# Patient Record
Sex: Male | Born: 1955 | Race: Black or African American | Hispanic: No | Marital: Single | Smoking: Former smoker
Health system: Southern US, Community
[De-identification: ages and names within clinical notes are randomized; demographics above are authoritative.]

## PROBLEM LIST (undated history)

## (undated) DIAGNOSIS — R52 Pain, unspecified: Secondary | ICD-10-CM

## (undated) DIAGNOSIS — R7303 Prediabetes: Secondary | ICD-10-CM

## (undated) DIAGNOSIS — I10 Essential (primary) hypertension: Secondary | ICD-10-CM

## (undated) DIAGNOSIS — K219 Gastro-esophageal reflux disease without esophagitis: Secondary | ICD-10-CM

## (undated) HISTORY — PX: ESOPHAGOGASTRODUODENOSCOPY: SHX1529

## (undated) HISTORY — PX: COLONOSCOPY: SHX174

## (undated) HISTORY — DX: Prediabetes: R73.03

---

## 2003-01-30 ENCOUNTER — Ambulatory Visit (HOSPITAL_COMMUNITY): Admission: RE | Admit: 2003-01-30 | Discharge: 2003-01-30 | Payer: Self-pay | Admitting: Gastroenterology

## 2004-05-30 ENCOUNTER — Encounter (INDEPENDENT_AMBULATORY_CARE_PROVIDER_SITE_OTHER): Payer: Self-pay | Admitting: *Deleted

## 2004-05-30 ENCOUNTER — Ambulatory Visit (HOSPITAL_COMMUNITY): Admission: RE | Admit: 2004-05-30 | Discharge: 2004-05-30 | Payer: Self-pay | Admitting: Gastroenterology

## 2005-06-29 ENCOUNTER — Emergency Department (HOSPITAL_COMMUNITY): Admission: EM | Admit: 2005-06-29 | Discharge: 2005-06-29 | Payer: Self-pay | Admitting: Emergency Medicine

## 2009-09-15 ENCOUNTER — Emergency Department (HOSPITAL_COMMUNITY): Admission: EM | Admit: 2009-09-15 | Discharge: 2009-09-15 | Payer: Self-pay | Admitting: Emergency Medicine

## 2010-05-10 LAB — CBC
HCT: 40.8 % (ref 39.0–52.0)
MCH: 29.6 pg (ref 26.0–34.0)
MCHC: 34.9 g/dL (ref 30.0–36.0)
MCV: 84.9 fL (ref 78.0–100.0)
Platelets: 257 10*3/uL (ref 150–400)
WBC: 8.9 10*3/uL (ref 4.0–10.5)

## 2010-05-10 LAB — DIFFERENTIAL
Basophils Relative: 1 % (ref 0–1)
Eosinophils Relative: 2 % (ref 0–5)
Lymphocytes Relative: 18 % (ref 12–46)
Lymphs Abs: 1.6 10*3/uL (ref 0.7–4.0)
Monocytes Absolute: 0.7 10*3/uL (ref 0.1–1.0)
Neutrophils Relative %: 71 % (ref 43–77)

## 2010-07-11 NOTE — Op Note (Signed)
NAME:  Maxwell Harris, Maxwell Harris             ACCOUNT NO.:  [id number]   MEDICAL RECORD NO.:  [id number]          PATIENT TYPE:  AMB   LOCATION:  ENDO                         FACILITY:  [facility]   PHYSICIAN:  Anselmo Rod, M.D.  DATE OF BIRTH:  1955-06-25   DATE OF PROCEDURE:  05/30/2004  DATE OF DISCHARGE:                                 OPERATIVE REPORT   PROCEDURE:  Colonoscopy with cold biopsies x 1.   ENDOSCOPIST:  Charna Elizabeth, M.D.   INSTRUMENT USED:  Olympus video colonoscope.   INDICATIONS FOR PROCEDURE:  55 year old African American male with a family  history of guaiac positive stool and change in bowel habits undergoing  screening colonoscopy to rule out colonic polyps, masses, etc.   PREPROCEDURE PREPARATION:  Informed consent was procured from the patient.  The patient was fasted for eight hours prior to the procedure and prepped  with a bottle of magnesium citrate and a gallon of GoLYTELY the night prior  to the procedure.  The risks and benefits of the procedure including a 10%  miss rate of cancer and polyp was discussed with the patient, as well.   PREPROCEDURE PHYSICAL:  Patient with stable vital signs.  Neck supple.  Chest clear to auscultation.  S1 and S2 regular.  Abdomen soft with normal  bowel sounds.   DESCRIPTION OF PROCEDURE:  The patient was placed in the left lateral  decubitus position, sedated with 100 mg of Demerol and 6 mg Versed in slow  incremental doses.  Once the patient was adequately sedated, maintained on  low flow oxygen and continuous cardiac monitoring, the Olympus video  colonoscope was advanced from the rectum to the cecum.  There was some  residual stool in the distal left colon, multiple washes were done.  The  appendiceal orifice and ileocecal valve were clearly visualized and  photographed.  A small erosion was biopsied x 1 from the terminal ileum.  The rest of the exam was unremarkable.  Prominent internal hemorrhoids were  seen on  retroflexion.  No masses, polyps, erosions, ulcerations, or  diverticula were seen in the colon.   IMPRESSION:  1.  Small erosion biopsied from terminal ileum.  2.  Prominent internal hemorrhoids.  3.  Otherwise, unrevealing colonoscopy.   RECOMMENDATIONS:  1.  Repeat guaiac testing has been recommended on an outpatient basis.  2.  Repeat colonoscopy will be performed in the next five years unless the      patient develops any abnormal symptoms in the interim.  3.  Outpatient follow up in the next two weeks or earlier if need be.      JNM/MEDQ  D:  05/30/2004  T:  05/30/2004  Job:  [id number]   cc:   Gabriel Earing, M.D.  [address]  [place]  [place] [address]  Fax: [phone]

## 2010-07-11 NOTE — Op Note (Signed)
NAME:  Maxwell Harris, Maxwell Harris                         ACCOUNT NO.:  [id number]   MEDICAL RECORD NO.:  [id number]                   PATIENT TYPE:  AMB   LOCATION:  ENDO                                 FACILITY:  [facility]   PHYSICIAN:  Anselmo Rod, M.D.               DATE OF BIRTH:  1955-10-17   DATE OF PROCEDURE:  01/30/2003  DATE OF DISCHARGE:                                 OPERATIVE REPORT   PROCEDURE PERFORMED:  Esophagogastroduodenoscopy.   ENDOSCOPIST:  Anselmo Rod, M.D.   INSTRUMENT USED:  Olympus video panendoscope.   INDICATIONS FOR PROCEDURE:  Epigastric pain in a 55 year old [place]-  American male.  Rule out peptic ulcer disease, esophagitis, gastritic, etc.   PREPROCEDURE PREPARATION:  Informed consent was secured from the patient.  The patient fasted for eight hours prior to the procedure.   PREPROCEDURE PHYSICAL:  VITAL SIGNS:  Stable.  NECK:  Supple.  CHEST:  Clear to auscultation.  HEART:  S1, S2 regular.  ABDOMEN:  Soft, with normal bowel sounds.   DESCRIPTION OF THE PROCEDURE:  The patient was placed in the left lateral  decubitus position and sedated with 80 mg of Demerol and 8 mg of Versed  intravenously.  Once the patient was adequately sedated and maintained on  low-flow oxygen and continuous cardiac monitor, the Olympus video  panendoscope was advanced through the mouthpiece, over the tongue, into the  esophagus under direct vision.  The entire esophagus appeared normal with no  evidence of ring, strictures, masses, esophagitis, or Barrett's mucosa.  The  scope was then advanced into the stomach.  The entire gastric mucosa and the  proximal small bowel appeared normal.  Retroflexion of the high cardia  revealed no abnormalities.   IMPRESSION:  Normal esophagogastroduodenoscopy.   RECOMMENDATIONS:  1. Continue PPIs.  2. Avoid nonsteroidals.  3. Outpatient follow-up in the next four weeks for further recommendations.  4. Colonoscopy after the age of  20.                                               Anselmo Rod, M.D.    JNM/MEDQ  D:  01/30/2003  T:  01/31/2003  Job:  [id number]   cc:   Gabriel Earing, M.D.  [address]  [place]  [place] [address]  Fax: [phone]

## 2011-07-24 ENCOUNTER — Encounter (HOSPITAL_COMMUNITY): Payer: Self-pay | Admitting: Emergency Medicine

## 2011-07-24 ENCOUNTER — Emergency Department (HOSPITAL_COMMUNITY)
Admission: EM | Admit: 2011-07-24 | Discharge: 2011-07-24 | Disposition: A | Discharge status: Home / Self Care | Payer: Medicare HMO | Attending: Emergency Medicine | Admitting: Emergency Medicine

## 2011-07-24 DIAGNOSIS — Z91013 Allergy to seafood: Secondary | ICD-10-CM | POA: Insufficient documentation

## 2011-07-24 DIAGNOSIS — R319 Hematuria, unspecified: Secondary | ICD-10-CM

## 2011-07-24 DIAGNOSIS — Z87891 Personal history of nicotine dependence: Secondary | ICD-10-CM | POA: Insufficient documentation

## 2011-07-24 LAB — URINALYSIS, ROUTINE W REFLEX MICROSCOPIC
Glucose, UA: NEGATIVE mg/dL
Ketones, ur: NEGATIVE mg/dL
Leukocytes, UA: NEGATIVE
Nitrite: NEGATIVE
Urobilinogen, UA: 0.2 mg/dL (ref 0.0–1.0)
pH: 5.5 (ref 5.0–8.0)

## 2011-07-24 LAB — URINE MICROSCOPIC-ADD ON

## 2011-07-24 NOTE — ED Notes (Signed)
Pt states he noticed his urine having a brown color yesterday. Pt states he woke up this morning at 3 am to use the bathroom and his urine was brown. Pt states he is having minor pain in his back/flank area but thinks it is because of his "bad hips." pt is in no apparent distress.

## 2011-07-24 NOTE — ED Notes (Signed)
Patient reports left sided flank pain and brownish color urine.

## 2011-07-24 NOTE — ED Provider Notes (Signed)
History     CSN: [id number]  Arrival date & time 07/24/11  0405   First MD Initiated Contact with Patient 07/24/11 [phone]      Chief Complaint  Patient presents with  . Flank Pain    left side  . Hematuria    (Consider location/radiation/quality/duration/timing/severity/associated sxs/prior treatment) The history is provided by the patient.   the patient reports several episodes of dark urine yesterday.  He reports his urine specimen given in the emergency department is much clearer than it was yesterday.  He denies anticoagulant use.  He denies flank pain.  He has no radiating abdominal pain.  He denies nausea vomiting.  He's had no fevers chills.  Denies dysuria or urinary frequency.  He wanted to be evaluated because of his dark urine.  He is otherwise without complaints  History reviewed. No pertinent past medical history.  No past surgical history on file.  History reviewed. No pertinent family history.  History  Substance Use Topics  . Smoking status: Former Smoker    Types: Cigarettes  . Smokeless tobacco: Not on file  . Alcohol Use: 0.6 oz/week    1 Cans of beer per week     social      Review of Systems  Genitourinary: Positive for hematuria and flank pain.  All other systems reviewed and are negative.    Allergies  Shellfish allergy  Home Medications   Current Outpatient Rx  Name Route Sig Dispense Refill  . ACETAMINOPHEN 500 MG PO TABS Oral Take 500 mg by mouth every 6 (six) hours as needed. joint    . VITAMIN D 1000 UNITS PO TABS Oral Take 1,000 Units by mouth daily.    Marland Kitchen HYDROCODONE-ACETAMINOPHEN 5-500 MG PO TABS Oral Take 1 tablet by mouth every 6 (six) hours as needed. Severe joint arthritis    . OMEPRAZOLE 20 MG PO CPDR Oral Take 20 mg by mouth daily.      BP 144/92  Pulse 73  Temp 98.3 F (36.8 C)  Resp 20  SpO2 99%  Physical Exam  Nursing note and vitals reviewed. Constitutional: He is oriented to person, place, and time. He appears  well-developed and well-nourished.  HENT:  Head: Normocephalic and atraumatic.  Eyes: EOM are normal.  Neck: Normal range of motion.  Cardiovascular: Normal rate, regular rhythm, normal heart sounds and intact distal pulses.   Pulmonary/Chest: Effort normal and breath sounds normal. No respiratory distress.  Abdominal: Soft. He exhibits no distension. There is no tenderness.  Musculoskeletal: Normal range of motion.  Neurological: He is alert and oriented to person, place, and time.  Skin: Skin is warm and dry.  Psychiatric: He has a normal mood and affect. Judgment normal.    ED Course  Procedures (including critical care time)  Labs Reviewed  URINALYSIS, ROUTINE W REFLEX MICROSCOPIC - Abnormal; Notable for the following:    Hgb urine dipstick LARGE (*)    Protein, ur 30 (*)    All other components within normal limits  URINE MICROSCOPIC-ADD ON - Abnormal; Notable for the following:    Bacteria, UA FEW (*)    All other components within normal limits   No results found.   1. Hematuria       MDM  Urology follow up.  The patient is without any symptoms.  He denies flank pain.  No anticoagulants.  Microscopic hematuria       Lyanne Co, MD 07/24/11 [phone]

## 2012-03-24 ENCOUNTER — Ambulatory Visit: Payer: Medicare HMO | Admitting: Physical Therapy

## 2012-03-29 ENCOUNTER — Ambulatory Visit: Payer: Non-veteran care | Attending: Orthopedic Surgery | Admitting: Physical Therapy

## 2012-03-29 DIAGNOSIS — R262 Difficulty in walking, not elsewhere classified: Secondary | ICD-10-CM | POA: Insufficient documentation

## 2012-03-29 DIAGNOSIS — IMO0001 Reserved for inherently not codable concepts without codable children: Secondary | ICD-10-CM | POA: Insufficient documentation

## 2012-03-29 DIAGNOSIS — M25569 Pain in unspecified knee: Secondary | ICD-10-CM | POA: Insufficient documentation

## 2012-04-01 ENCOUNTER — Ambulatory Visit: Payer: Non-veteran care | Admitting: Physical Therapy

## 2012-04-05 ENCOUNTER — Ambulatory Visit: Payer: Non-veteran care | Admitting: Physical Therapy

## 2012-04-07 ENCOUNTER — Ambulatory Visit: Payer: Non-veteran care | Admitting: Physical Therapy

## 2012-04-08 ENCOUNTER — Ambulatory Visit: Payer: Non-veteran care | Admitting: Physical Therapy

## 2012-04-12 ENCOUNTER — Ambulatory Visit: Payer: Non-veteran care | Admitting: Physical Therapy

## 2012-04-14 ENCOUNTER — Ambulatory Visit: Payer: Non-veteran care | Admitting: Physical Therapy

## 2012-04-19 ENCOUNTER — Ambulatory Visit: Payer: Non-veteran care | Admitting: Physical Therapy

## 2012-04-20 ENCOUNTER — Ambulatory Visit: Payer: Non-veteran care | Admitting: Physical Therapy

## 2012-04-21 ENCOUNTER — Ambulatory Visit: Payer: Non-veteran care | Admitting: Physical Therapy

## 2012-04-25 ENCOUNTER — Ambulatory Visit: Payer: Non-veteran care | Attending: Orthopedic Surgery | Admitting: Physical Therapy

## 2012-04-25 DIAGNOSIS — M25569 Pain in unspecified knee: Secondary | ICD-10-CM | POA: Insufficient documentation

## 2012-04-25 DIAGNOSIS — IMO0001 Reserved for inherently not codable concepts without codable children: Secondary | ICD-10-CM | POA: Insufficient documentation

## 2012-04-25 DIAGNOSIS — R262 Difficulty in walking, not elsewhere classified: Secondary | ICD-10-CM | POA: Insufficient documentation

## 2012-04-29 ENCOUNTER — Ambulatory Visit: Payer: Non-veteran care | Admitting: Physical Therapy

## 2012-05-02 ENCOUNTER — Ambulatory Visit: Payer: Non-veteran care | Admitting: Physical Therapy

## 2012-05-04 ENCOUNTER — Ambulatory Visit: Payer: Non-veteran care | Admitting: Physical Therapy

## 2012-05-05 ENCOUNTER — Ambulatory Visit: Payer: Non-veteran care | Admitting: Physical Therapy

## 2012-05-10 ENCOUNTER — Ambulatory Visit: Payer: Non-veteran care | Admitting: Physical Therapy

## 2012-05-12 ENCOUNTER — Ambulatory Visit: Payer: Non-veteran care | Admitting: Physical Therapy

## 2012-05-16 ENCOUNTER — Ambulatory Visit: Payer: Non-veteran care | Admitting: Physical Therapy

## 2014-08-23 ENCOUNTER — Other Ambulatory Visit: Payer: Self-pay | Admitting: Orthopedic Surgery

## 2014-08-23 DIAGNOSIS — M545 Low back pain: Secondary | ICD-10-CM

## 2014-09-02 ENCOUNTER — Ambulatory Visit
Admission: RE | Admit: 2014-09-02 | Discharge: 2014-09-02 | Disposition: A | Discharge status: Home / Self Care | Payer: Non-veteran care | Source: Ambulatory Visit | Attending: Orthopedic Surgery | Admitting: Orthopedic Surgery

## 2014-09-02 DIAGNOSIS — M545 Low back pain: Secondary | ICD-10-CM

## 2016-07-03 ENCOUNTER — Encounter (HOSPITAL_COMMUNITY): Payer: Self-pay | Admitting: *Deleted

## 2016-07-03 ENCOUNTER — Emergency Department (HOSPITAL_COMMUNITY)
Admission: EM | Admit: 2016-07-03 | Discharge: 2016-07-04 | Disposition: A | Discharge status: Home / Self Care | Payer: Non-veteran care | Attending: Emergency Medicine | Admitting: Emergency Medicine

## 2016-07-03 ENCOUNTER — Emergency Department (HOSPITAL_COMMUNITY): Payer: Non-veteran care

## 2016-07-03 DIAGNOSIS — Y929 Unspecified place or not applicable: Secondary | ICD-10-CM | POA: Diagnosis not present

## 2016-07-03 DIAGNOSIS — S01511A Laceration without foreign body of lip, initial encounter: Secondary | ICD-10-CM | POA: Diagnosis not present

## 2016-07-03 DIAGNOSIS — Y939 Activity, unspecified: Secondary | ICD-10-CM | POA: Insufficient documentation

## 2016-07-03 DIAGNOSIS — W1839XA Other fall on same level, initial encounter: Secondary | ICD-10-CM | POA: Insufficient documentation

## 2016-07-03 DIAGNOSIS — S0993XA Unspecified injury of face, initial encounter: Secondary | ICD-10-CM | POA: Diagnosis present

## 2016-07-03 DIAGNOSIS — R55 Syncope and collapse: Secondary | ICD-10-CM | POA: Diagnosis not present

## 2016-07-03 DIAGNOSIS — Z87891 Personal history of nicotine dependence: Secondary | ICD-10-CM | POA: Diagnosis not present

## 2016-07-03 DIAGNOSIS — S025XXA Fracture of tooth (traumatic), initial encounter for closed fracture: Secondary | ICD-10-CM | POA: Diagnosis not present

## 2016-07-03 DIAGNOSIS — S0181XA Laceration without foreign body of other part of head, initial encounter: Secondary | ICD-10-CM

## 2016-07-03 DIAGNOSIS — Y999 Unspecified external cause status: Secondary | ICD-10-CM | POA: Insufficient documentation

## 2016-07-03 DIAGNOSIS — S0182XA Laceration with foreign body of other part of head, initial encounter: Secondary | ICD-10-CM | POA: Diagnosis not present

## 2016-07-03 LAB — BASIC METABOLIC PANEL
ANION GAP: 11 (ref 5–15)
BUN: 12 mg/dL (ref 6–20)
CALCIUM: 9.3 mg/dL (ref 8.9–10.3)
CO2: 19 mmol/L — ABNORMAL LOW (ref 22–32)
Chloride: 107 mmol/L (ref 101–111)
Creatinine, Ser: 1.19 mg/dL (ref 0.61–1.24)
Glucose, Bld: 135 mg/dL — ABNORMAL HIGH (ref 65–99)
Potassium: 3.9 mmol/L (ref 3.5–5.1)
SODIUM: 137 mmol/L (ref 135–145)

## 2016-07-03 LAB — CBC
HCT: 37.5 % — ABNORMAL LOW (ref 39.0–52.0)
HEMOGLOBIN: 12.8 g/dL — AB (ref 13.0–17.0)
MCH: 29 pg (ref 26.0–34.0)
MCHC: 34.1 g/dL (ref 30.0–36.0)
MCV: 85 fL (ref 78.0–100.0)
PLATELETS: 245 10*3/uL (ref 150–400)
RBC: 4.41 MIL/uL (ref 4.22–5.81)
RDW: 13.5 % (ref 11.5–15.5)
WBC: 8.2 10*3/uL (ref 4.0–10.5)

## 2016-07-03 LAB — TROPONIN I

## 2016-07-03 MED ORDER — LIDOCAINE HCL 1 % IJ SOLN
30.0000 mL | Freq: Once | INTRAMUSCULAR | Status: AC
  Administered 2016-07-03: 30 mL
  Filled 2016-07-03: qty 40

## 2016-07-03 MED ORDER — MORPHINE SULFATE (PF) 4 MG/ML IV SOLN
4.0000 mg | Freq: Once | INTRAVENOUS | Status: AC
Start: 2016-07-03 — End: 2016-07-03
  Administered 2016-07-03: 4 mg via INTRAVENOUS
  Filled 2016-07-03: qty 1

## 2016-07-03 NOTE — ED Provider Notes (Signed)
[facility]ENCY [facility]PT Provider Note   CSN: [id number] Arrival date & time: 07/03/16  2210     History   Chief Complaint Chief Complaint  Patient presents with  . Loss of Consciousness  . Laceration    HPI Maxwell Harris is a 61 y.o. male.  Patient presents with facial injury after syncopal episode prior to arrival. He reports he became choked on water he was drinking and passed out, falling forward and hitting his face on wood floor. He reports LOC was brief. No nausea, vomiting, chest pain, SOB. He denies abdominal injury. No neck pain. He feels his upper teeth are loose. No headache, visual changes.   The history is provided by the patient. No language interpreter was used.    History reviewed. No pertinent past medical history.  There are no active problems to display for this patient.   History reviewed. No pertinent surgical history.     Home Medications    Prior to Admission medications   Medication Sig Start Date End Date Taking? Authorizing Provider  acetaminophen (TYLENOL) 500 MG tablet Take 500 mg by mouth every 6 (six) hours as needed. joint    [provider]  cholecalciferol (VITAMIN D) 1000 UNITS tablet Take 1,000 Units by mouth daily.    [provider]  HYDROcodone-acetaminophen (VICODIN) 5-500 MG per tablet Take 1 tablet by mouth every 6 (six) hours as needed. Severe joint arthritis    [provider]  omeprazole (PRILOSEC) 20 MG capsule Take 20 mg by mouth daily.    [provider]    Family History No family history on file.  Social History Social History  Substance Use Topics  . Smoking status: Former Smoker    Types: Cigarettes  . Smokeless tobacco: Never Used  . Alcohol use 0.6 oz/week    1 Cans of beer per week     Comment: social     Allergies   Shellfish allergy   Review of Systems Review of Systems  Constitutional: Negative for chills, diaphoresis and fever.  HENT: Positive for facial  swelling. Negative for trouble swallowing.   Eyes: Negative for visual disturbance.  Respiratory: Negative.  Negative for shortness of breath.   Cardiovascular: Negative.  Negative for chest pain.  Gastrointestinal: Negative.  Negative for nausea and vomiting.  Musculoskeletal: Negative.   Skin: Positive for color change and wound.  Neurological: Positive for syncope. Negative for weakness and headaches.     Physical Exam Updated Vital Signs BP 114/68   Resp (!) 22   Ht 5\' 11"  (1.803 m)   Wt 125.2 kg   BMI 38.49 kg/m   Physical Exam  Constitutional: He is oriented to person, place, and time. He appears well-developed and well-nourished.  HENT:  Head: Normocephalic.  Lower lip has a large flap wound to central lip involving full thickness of lip. 2 cm linear laceration upper lip extending to buccal surface. Upper central incisor laxity. No maxillary plate tenderness. No mandibular tenderness or deformity. No malocclusion.   Neck: Normal range of motion. Neck supple.  Cardiovascular: Normal rate.   Pulmonary/Chest: Effort normal. He exhibits no tenderness.  Abdominal: Soft. Bowel sounds are normal. There is no tenderness. There is no rebound and no guarding.  Musculoskeletal: Normal range of motion. He exhibits no deformity.  No midline cervical tenderness. Full, pain free range of motion of the neck.   Neurological: He is alert and oriented to person, place, and time. No sensory deficit. He exhibits normal muscle tone. Coordination  normal.  CN's 3-12 grossly intact. Speech is clear and focused. No facial asymmetry. No lateralizing weakness. No deficits of coordination. Ambulatory without imbalance.    Skin: Skin is warm and dry. No rash noted.  Superficial abrasion and laceration to glabellar area with mild swelling. Abrasion to parietal scalp without hematoma.  Psychiatric: He has a normal mood and affect.     ED Treatments / Results  Labs (all labs ordered are listed, but  only abnormal results are displayed) Labs Reviewed  BASIC METABOLIC PANEL - Abnormal; Notable for the following:       Result Value   CO2 19 (*)    Glucose, Bld 135 (*)    All other components within normal limits  CBC - Abnormal; Notable for the following:    Hemoglobin 12.8 (*)    HCT 37.5 (*)    All other components within normal limits  TROPONIN I  CBG MONITORING, ED    EKG  EKG Interpretation None       Radiology No results found.  Procedures Procedures (including critical care time) LACERATION REPAIR Performed by: Elpidio AnisUPSTILL, Darien Kading A Authorized by: Elpidio AnisUPSTILL, Tracker Mance A Consent: Verbal consent obtained. Risks and benefits: risks, benefits and alternatives were discussed Consent given by: patient Patient identity confirmed: provided demographic data Prepped and Draped in normal sterile fashion Wound explored  Laceration Location: lower lip  Laceration Length: 4 cm  No Foreign Bodies seen or palpated  Anesthesia: local infiltration  Local anesthetic: lidocaine 1% w/o epinephrine  Anesthetic total: 4 ml  Amount of cleaning: standard  Complicated closure due to full thickness open flap of lower lip, stellate portion requiring debridement.   Skin closure: 6-0 and 5-0 chromic gut  Number of sutures: 18  Technique: simple interrupted  Patient tolerance: Patient tolerated the procedure well with no immediate complications.   LACERATION REPAIR Performed by: Elpidio AnisUPSTILL, Didier Brandenburg A Authorized by: Elpidio AnisUPSTILL, Roberth Berling A Consent: Verbal consent obtained. Risks and benefits: risks, benefits and alternatives were discussed Consent given by: patient Patient identity confirmed: provided demographic data Prepped and Draped in normal sterile fashion Wound explored  Laceration Location: upper lip extending to buccal surface  Laceration Length: 2 cm  No Foreign Bodies seen or palpated  Anesthesia: local infiltration  Local anesthetic: lidocaine 1% w/o  epinephrine  Anesthetic total: 2 ml  Irrigation method: syringe Amount of cleaning: standard  Skin closure: 6-0 chromic gut  Number of sutures: 3  Technique: simple interrupted.  Patient tolerance: Patient tolerated the procedure well with no immediate complications.  Medications Ordered in ED Medications  morphine 4 MG/ML injection 4 mg (not administered)  lidocaine (PF) (XYLOCAINE) 1 % injection 30 mL (not administered)     Initial Impression / Assessment and Plan / ED Course  I have reviewed the triage vital signs and the nursing notes.  Pertinent labs & imaging results that were available during my care of the patient were reviewed by me and considered in my medical decision making (see chart for details).     Patient presents with facial injury after syncopal episode caused by choking. No other injury. No chest pain, nausea, abdominal pain.   Lacerations repaired as per above note. No change in neurologic status during ED encounter. CT maxillofacial is negative for bony injury. There is a root fracture of upper incisor.   Final Clinical Impressions(s) / ED Diagnoses   Final diagnoses:  None   1. Facial injury 2. Syncope 3. Lip laceration  New Prescriptions New Prescriptions  No medications on file     Danne Harbor 07/04/16 Alverda Skeans, MD 07/04/16 [phone]

## 2016-07-03 NOTE — ED Triage Notes (Signed)
The pt supper teeth went through his lower lip  The laceration goes through the vermilion border

## 2016-07-03 NOTE — ED Triage Notes (Signed)
The pt fell when he fainted earlier tonight and he struck his mouth on the floor  He has a laceration and he has loose loose upper teeth

## 2016-07-04 MED ORDER — HYDROCODONE-ACETAMINOPHEN 5-325 MG PO TABS: 1.0000 | ORAL_TABLET | ORAL | 0 refills | Status: DC | PRN

## 2016-07-04 MED ORDER — CEPHALEXIN 500 MG PO CAPS: 500.0000 mg | ORAL_CAPSULE | Freq: Four times a day (QID) | ORAL | 0 refills | Status: DC

## 2016-07-04 NOTE — ED Notes (Signed)
Pt verbalized understanding of d/c instructions and has no further questions. Pt stable and NAD. VSS.  

## 2016-07-04 NOTE — ED Notes (Addendum)
PA in room to suture up pt's lip.

## 2018-03-09 IMAGING — CT CT MAXILLOFACIAL W/O CM
3 series · 16 of 47 positions shown, 19 images · non-contrast
Comparison: None.

CLINICAL DATA: Status post fall with facial injury. Deep laceration
to bottom lip.

EXAM:
CT MAXILLOFACIAL WITHOUT CONTRAST
TECHNIQUE: Multidetector CT imaging of the maxillofacial structures was
performed. Multiplanar CT image reconstructions were also generated.
A small metallic BB was placed on the right temple in order to
reliably differentiate right from left.

[Series 3: facialbone 2.0 st · axial · 0.37mm/px · z∈[+334,+482]mm · 10 of 86 slices shown, 13 images]
[im 6/86  brain]
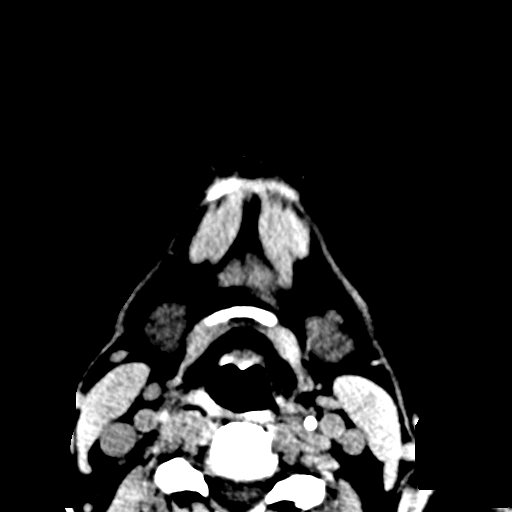
[im 6/86  bone]
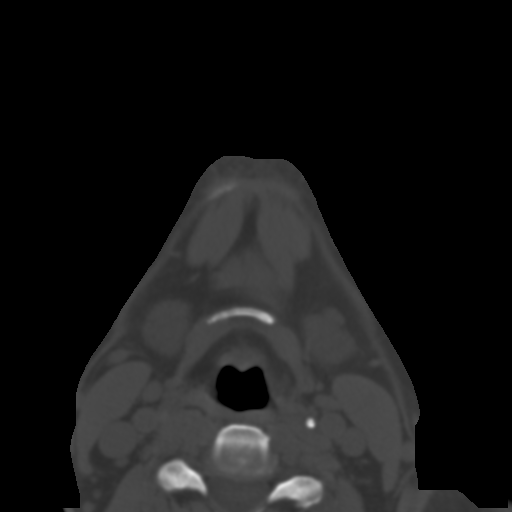
[im 15/86  bone]
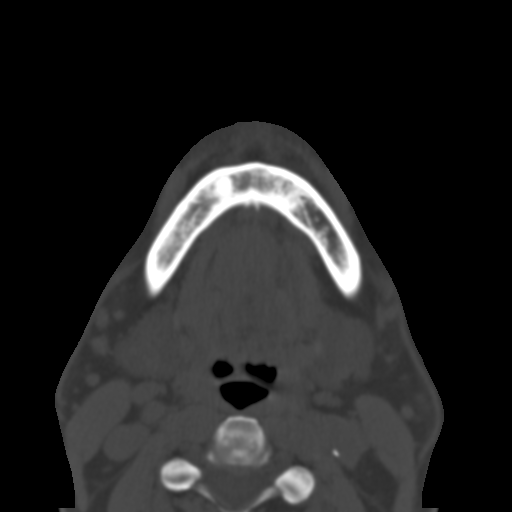
[im 24/86  bone]
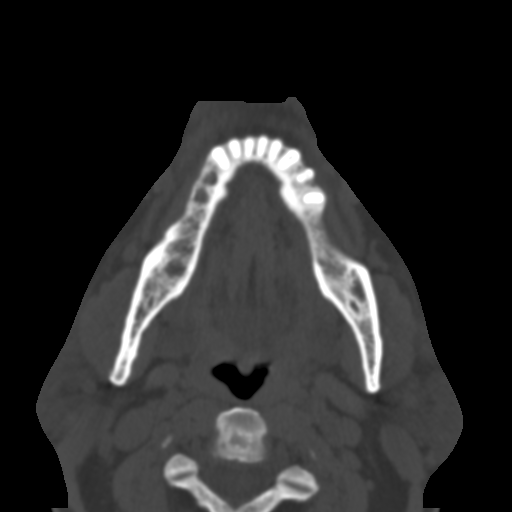
[im 30/86  bone]
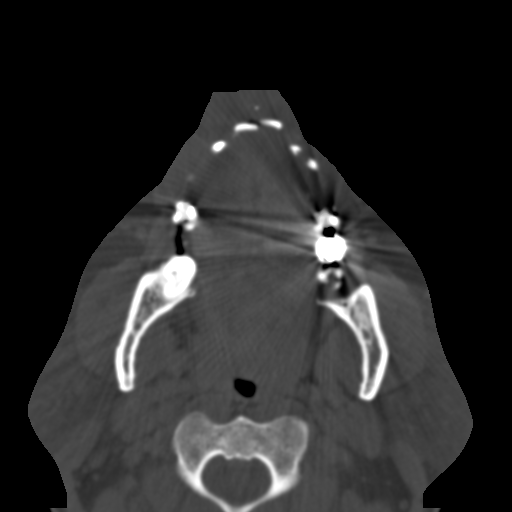
[im 39/86  brain]
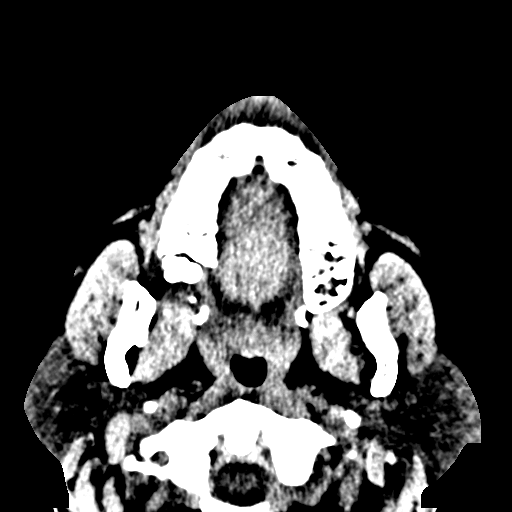
[im 39/86  bone]
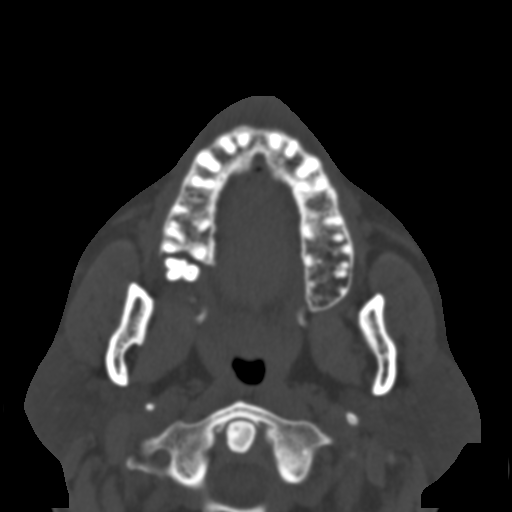
[im 47/86  bone]
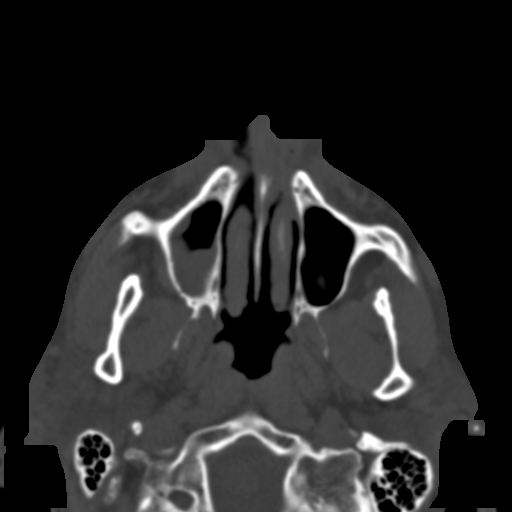
[im 56/86  bone]
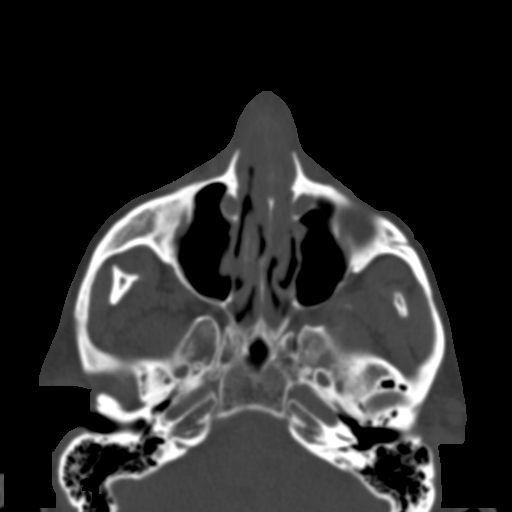
[im 65/86  bone]
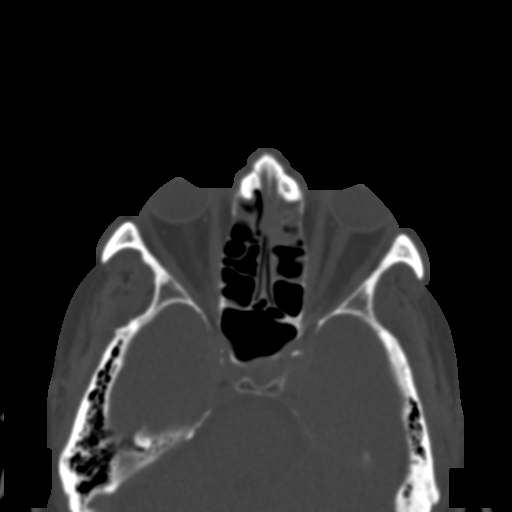
[im 71/86  brain]
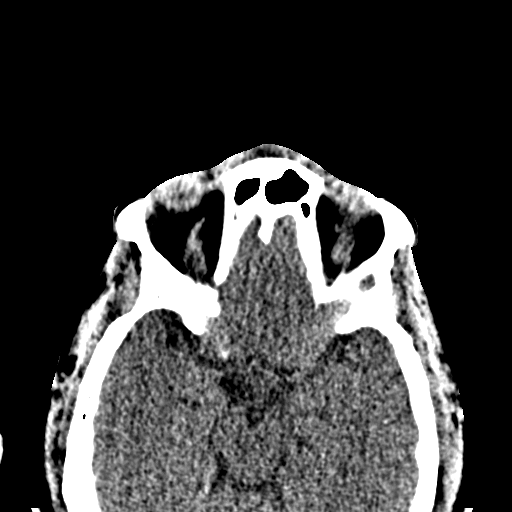
[im 71/86  bone]
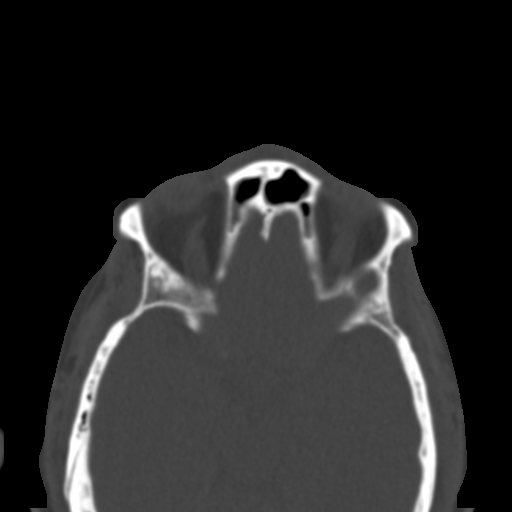
[im 80/86  bone]
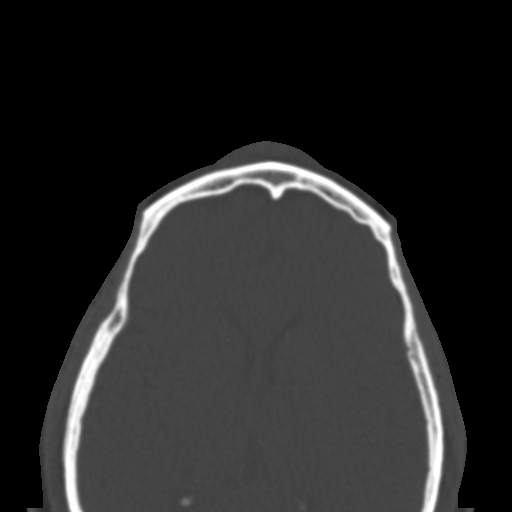

[Series 7: facialbone 2.0 cor st · coronal · 0.35mm/px · 3 of 76 slices shown]
[im 26/76  bone]
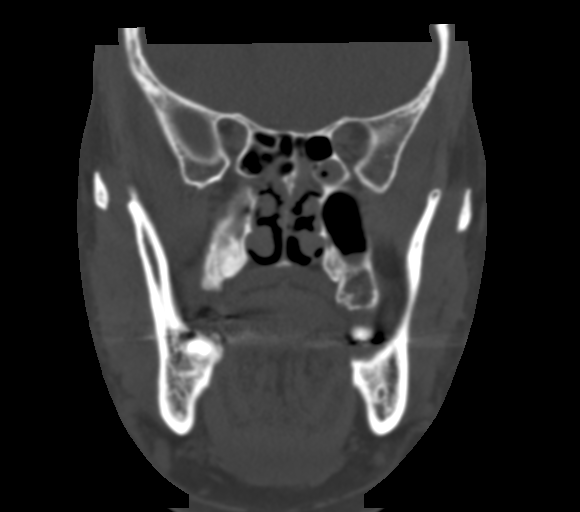
[im 34/76  bone]
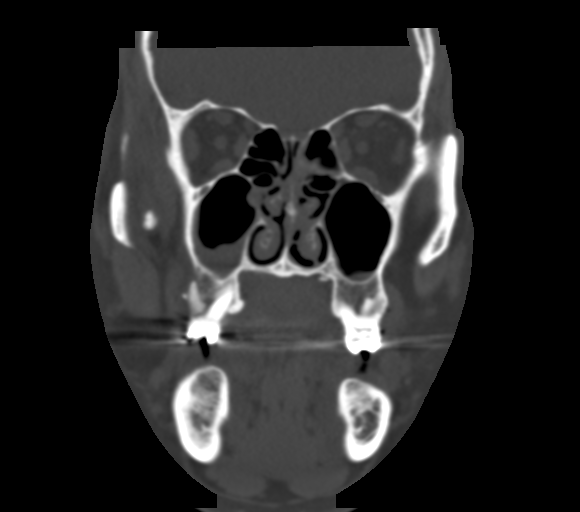
[im 42/76  bone]
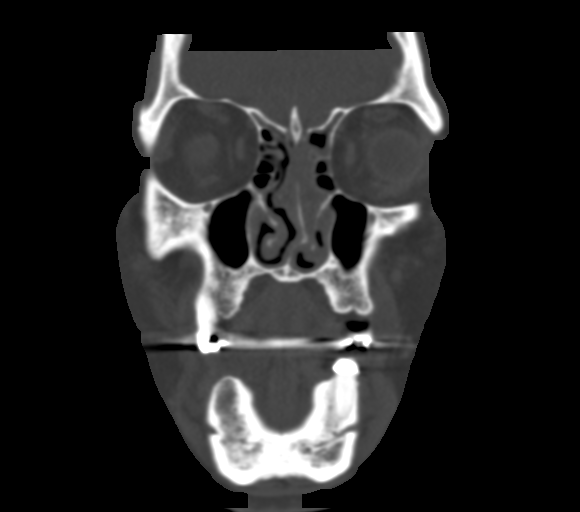

[Series 8: facialbone 2.0 sag st · sagittal · 0.35mm/px · 3 of 89 slices shown]
[im 30/89  bone]
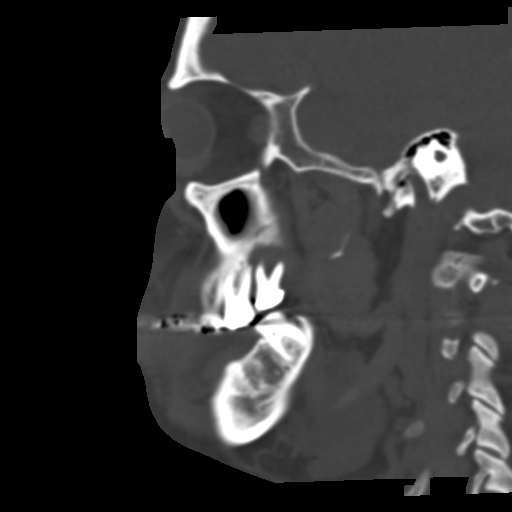
[im 45/89  bone]
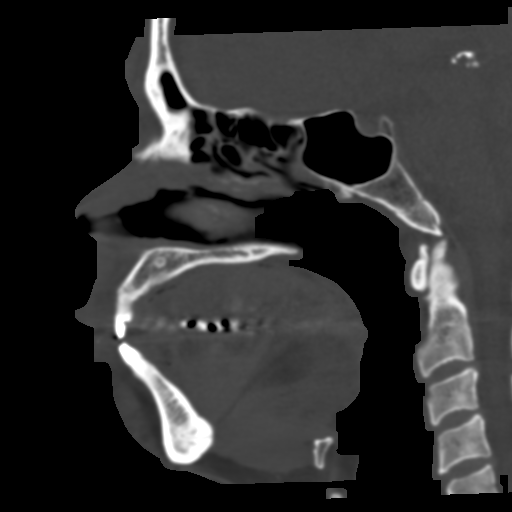
[im 59/89  bone]
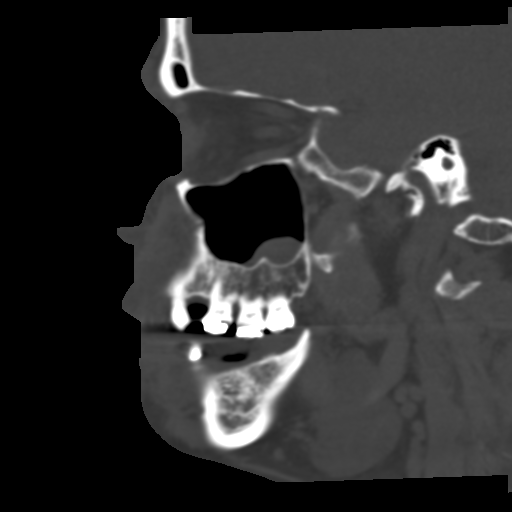

[16 of 47 positions shown; findings below may reference images not displayed]

FINDINGS: Osseous: Right first maxillary incisor root fracture (series 5,
image 22). Numerous dental caries and periapical cysts compatible
with odontogenic disease most severe involving the right maxillary
posterior most molar. Sclerotic focus within the dens compatible
with bone island.

Orbits: Negative. No traumatic or inflammatory finding.

Sinuses: Moderate right maxillary sinus mucosal thickening. Left
maxillary sinus mucous retention cyst, mild diffuse frontal and
ethmoid sinus mucosal thickening. Normally aerated mastoid air
cells.

Soft tissues: Frontal scalp soft tissue contusion with small foci of
air indicating laceration. Large laceration and soft tissue
contusion involving the lower lip.

Limited intracranial: No significant or unexpected finding.
IMPRESSION: 1. Frontal scalp soft tissue contusion.
2. Large laceration involving the lower lip.
3. Right first maxillary incisor root fracture.
4. No other acute fracture or dislocation of the face identified.
5. Extensive odontogenic disease greatest involving the right
maxillary posterior most molar.
6. Moderate diffuse paranasal sinus disease.

By: Dombi Bilics M.D.

## 2019-09-11 ENCOUNTER — Encounter (HOSPITAL_COMMUNITY): Payer: Self-pay | Admitting: Emergency Medicine

## 2019-09-11 ENCOUNTER — Other Ambulatory Visit: Payer: Self-pay

## 2019-09-11 ENCOUNTER — Emergency Department (HOSPITAL_COMMUNITY)
Admission: EM | Admit: 2019-09-11 | Discharge: 2019-09-11 | Disposition: A | Discharge status: Home / Self Care | Payer: Medicare PPO | Attending: Emergency Medicine | Admitting: Emergency Medicine

## 2019-09-11 DIAGNOSIS — R1010 Upper abdominal pain, unspecified: Secondary | ICD-10-CM | POA: Diagnosis present

## 2019-09-11 DIAGNOSIS — Z5321 Procedure and treatment not carried out due to patient leaving prior to being seen by health care provider: Secondary | ICD-10-CM | POA: Diagnosis not present

## 2019-09-11 DIAGNOSIS — R42 Dizziness and giddiness: Secondary | ICD-10-CM | POA: Diagnosis not present

## 2019-09-11 DIAGNOSIS — I10 Essential (primary) hypertension: Secondary | ICD-10-CM | POA: Diagnosis not present

## 2019-09-11 HISTORY — DX: Essential (primary) hypertension: I10

## 2019-09-11 NOTE — ED Triage Notes (Signed)
Pt reporting upper abdominal pain x1 month. Denies n/v. Patient also reporting dizziness and high blood pressure. States BP was 196/88.   Patient had colonoscopy on June 17th which was normal.

## 2019-09-11 NOTE — ED Notes (Signed)
Pt eloped from waiting area. Called 3X.  

## 2019-09-12 ENCOUNTER — Ambulatory Visit
Admission: EM | Admit: 2019-09-12 | Discharge: 2019-09-12 | Disposition: A | Discharge status: Home / Self Care | Payer: Non-veteran care

## 2019-09-12 DIAGNOSIS — R1013 Epigastric pain: Secondary | ICD-10-CM | POA: Diagnosis not present

## 2019-09-12 HISTORY — DX: Pain, unspecified: R52

## 2019-09-12 HISTORY — DX: Gastro-esophageal reflux disease without esophagitis: K21.9

## 2019-09-12 MED ORDER — ALUM & MAG HYDROXIDE-SIMETH 200-200-20 MG/5ML PO SUSP
30.0000 mL | Freq: Once | ORAL | Status: AC
  Administered 2019-09-12: 30 mL via ORAL

## 2019-09-12 MED ORDER — FAMOTIDINE 20 MG PO TABS
20.0000 mg | ORAL_TABLET | Freq: Every day | ORAL | 0 refills | Status: DC
Start: 2019-09-12 — End: 2022-11-02

## 2019-09-12 MED ORDER — ESOMEPRAZOLE MAGNESIUM 40 MG PO CPDR: 40.0000 mg | DELAYED_RELEASE_CAPSULE | Freq: Every day | ORAL | 0 refills | Status: DC

## 2019-09-12 MED ORDER — LIDOCAINE VISCOUS HCL 2 % MT SOLN
15.0000 mL | Freq: Once | OROMUCOSAL | Status: AC
  Administered 2019-09-12: 15 mL via ORAL

## 2019-09-12 NOTE — Discharge Instructions (Signed)
Lidocaine and Maalox given in office today.  Start Nexium and Pepcid as directed.  Decrease fatty foods, spicy food, caffeine, alcohol, smoking for now.  Follow-up with GI for further evaluation and management needed.  If sudden worsening of symptoms, nausea/vomiting, blood in the vomit, black tarry stools, go to the emergency department for further evaluation.

## 2019-09-12 NOTE — ED Provider Notes (Signed)
[facility]    CSN: [id number] Arrival date & time: 09/12/19  1535      History   Chief Complaint Chief Complaint  Patient presents with   Abdominal Pain    HPI Maxwell Harris is a 64 y.o. male.   63 year old male comes in for 1 month history of epigastric pain.  This started after having colonoscopy/endoscopy 08/13/2019.  States this was done due to anemia and worries for GI bleed.  Unsure of specific results, but states no alarming findings.  Since then, has had intermittent epigastric pain, improves with eating, worse after a few hours of eating.  Denies nausea, vomiting, diarrhea.  Has noticed increased burping, passing gas.  Denies constipation, diarrhea.  Denies significant changes in bowel movements.  Denies burning sensation, globus sensation.  Denies fever.  Denies melena, hematochezia.  Does have a history of GERD, usually takes omeprazole, ran out a week ago.  He denies chronic NSAID use.  Has frequent alcohol use, caffeine use, THC use.     Past Medical History:  Diagnosis Date   Chronic GERD    Hypertension    Pain     There are no problems to display for this patient.   History reviewed. No pertinent surgical history.     Home Medications    Prior to Admission medications   Medication Sig Start Date End Date Taking? Authorizing Provider  acetaminophen (TYLENOL) 500 MG tablet Take 500 mg by mouth every 6 (six) hours as needed. joint   Yes [provider]  aspirin 81 MG chewable tablet Chew by mouth daily.   Yes [provider]  cholecalciferol (VITAMIN D) 1000 UNITS tablet Take 1,000 Units by mouth daily.   Yes [provider]  DULoxetine (CYMBALTA) 20 MG capsule Take 20 mg by mouth daily.   Yes [provider]  gabapentin (NEURONTIN) 300 MG capsule Take 300 mg by mouth 3 (three) times daily.   Yes [provider]  lisinopril (ZESTRIL) 10 MG tablet Take 10 mg by mouth daily.   Yes [provider]  omeprazole (PRILOSEC) 20 MG capsule Take 20 mg by mouth daily.  09/12/19 Yes [provider]  esomeprazole (NEXIUM) 40 MG capsule Take 1 capsule (40 mg total) by mouth daily. 09/12/19   Cathie Hoops, Eriberto Felch V, PA-C  famotidine (PEPCID) 20 MG tablet Take 1 tablet (20 mg total) by mouth daily. 09/12/19   Belinda Fisher, PA-C    Family History Family History  Problem Relation Age of Onset   Stroke Mother     Social History Social History   Tobacco Use   Smoking status: Former Smoker    Types: Cigarettes   Smokeless tobacco: Never Used  Building services engineer Use: Never used  Substance Use Topics   Alcohol use: Yes    Alcohol/week: 1.0 standard drink    Types: 1 Cans of beer per week    Comment: social   Drug use: Yes    Frequency: 1.0 times per week    Types: Marijuana     Allergies   Shellfish allergy   Review of Systems Review of Systems  Reason unable to perform ROS: See HPI as above.     Physical Exam Triage Vital Signs ED Triage Vitals  Enc Vitals Group     BP 09/12/19 1648 (!) 152/87     Pulse Rate 09/12/19 1648 74     Resp 09/12/19 1648 18     Temp 09/12/19 1648 98.8  F (37.1 C)     Temp Source 09/12/19 1648 Oral     SpO2 09/12/19 1648 95 %     Weight --      Height --      Head Circumference --      Peak Flow --      Pain Score 09/12/19 1640 7     Pain Loc --      Pain Edu? --      Excl. in GC? --    No data found.  Updated Vital Signs BP (!) 152/87 (BP Location: Left Arm)    Pulse 74    Temp 98.8 F (37.1 C) (Oral)    Resp 18    SpO2 95%    Physical Exam Constitutional:      General: He is not in acute distress.    Appearance: Normal appearance. He is well-developed. He is not toxic-appearing or diaphoretic.  HENT:     Head: Normocephalic and atraumatic.  Eyes:     Conjunctiva/sclera: Conjunctivae normal.     Pupils: Pupils are equal, round, and reactive to light.  Cardiovascular:     Rate and Rhythm: Normal rate and regular  rhythm.  Pulmonary:     Effort: Pulmonary effort is normal. No respiratory distress.     Comments: LCTAB Abdominal:     General: Bowel sounds are normal.     Palpations: Abdomen is soft.     Tenderness: There is abdominal tenderness in the epigastric area. There is no guarding or rebound. Negative signs include Murphy's sign.  Musculoskeletal:     Cervical back: Normal range of motion and neck supple.  Skin:    General: Skin is warm and dry.  Neurological:     Mental Status: He is alert and oriented to person, place, and time.      UC Treatments / Results  Labs (all labs ordered are listed, but only abnormal results are displayed) Labs Reviewed - No data to display  EKG   Radiology No results found.  Procedures Procedures (including critical care time)  Medications Ordered in UC Medications  alum & mag hydroxide-simeth (MAALOX/MYLANTA) 200-200-20 MG/5ML suspension 30 mL (30 mLs Oral Given 09/12/19 1730)    And  lidocaine (XYLOCAINE) 2 % viscous mouth solution 15 mL (15 mLs Oral Given 09/12/19 1731)    Initial Impression / Assessment and Plan / UC Course  I have reviewed the triage vital signs and the nursing notes.  Pertinent labs & imaging results that were available during my care of the patient were reviewed by me and considered in my medical decision making (see chart for details).    Chronic intermittent epigastric pain. ?  GERD causing symptoms.  Will provide GI cocktail in office today.  PPI + H2 blocker trial. Discussed decreasing EtOH, caffeine use for now. To follow up with GI if symptoms not improving. Return precautions given.  Final Clinical Impressions(s) / UC Diagnoses   Final diagnoses:  Abdominal pain, epigastric    ED Prescriptions    Medication Sig Dispense Auth. Provider   esomeprazole (NEXIUM) 40 MG capsule Take 1 capsule (40 mg total) by mouth daily. 14 capsule Hector Taft V, PA-C   famotidine (PEPCID) 20 MG tablet Take 1 tablet (20 mg total) by  mouth daily. 14 tablet Belinda Fisher, PA-C     PDMP not reviewed this encounter.   Belinda Fisher, PA-C 09/12/19 1733

## 2019-09-12 NOTE — ED Triage Notes (Signed)
Pt reports midepigastric/abdominal pain since he had a colonoscopy on June 20th.  Reports feeling of emptiness.  Denies sharp pains or tenderness.  States he has also noticed bad breath since procedure.  Denies n/v/d.  Denies fever.  Reports increased flatulence.  Pt states he does have reflux.

## 2019-12-18 ENCOUNTER — Ambulatory Visit
Admission: EM | Admit: 2019-12-18 | Discharge: 2019-12-18 | Disposition: A | Discharge status: Home / Self Care | Payer: Non-veteran care | Attending: Physician Assistant | Admitting: Physician Assistant

## 2019-12-18 DIAGNOSIS — R590 Localized enlarged lymph nodes: Secondary | ICD-10-CM | POA: Diagnosis not present

## 2019-12-18 MED ORDER — IBUPROFEN 800 MG PO TABS
800.0000 mg | ORAL_TABLET | Freq: Three times a day (TID) | ORAL | 0 refills | Status: DC
Start: 2019-12-18 — End: 2022-05-22

## 2019-12-18 NOTE — ED Provider Notes (Signed)
[facility]    CSN: [id number] Arrival date & time: 12/18/19  0998      History   Chief Complaint Chief Complaint  Patient presents with   Otalgia    HPI Maxwell Harris is a 64 y.o. male.   64 year old male comes in for 3 day history of right ear pain. Denies muffled hearing. States feels like there is drainage/water in the ear. Denies URI symptoms. Denies chest pain, shob, fever. Has had some dental work done recently. Denies oral swelling. States has some gum irritation from new dentures.      Past Medical History:  Diagnosis Date   Chronic GERD    Hypertension    Pain     There are no problems to display for this patient.   History reviewed. No pertinent surgical history.     Home Medications    Prior to Admission medications   Medication Sig Start Date End Date Taking? Authorizing Provider  acetaminophen (TYLENOL) 500 MG tablet Take 500 mg by mouth every 6 (six) hours as needed. joint    [provider]  aspirin 81 MG chewable tablet Chew by mouth daily.    [provider]  cholecalciferol (VITAMIN D) 1000 UNITS tablet Take 1,000 Units by mouth daily.    [provider]  DULoxetine (CYMBALTA) 20 MG capsule Take 20 mg by mouth daily.    [provider]  famotidine (PEPCID) 20 MG tablet Take 1 tablet (20 mg total) by mouth daily. 09/12/19   Cathie Hoops, Khylah Kendra V, PA-C  gabapentin (NEURONTIN) 300 MG capsule Take 300 mg by mouth 3 (three) times daily.    [provider]  ibuprofen (ADVIL) 800 MG tablet Take 1 tablet (800 mg total) by mouth 3 (three) times daily. 12/18/19   Cathie Hoops, Ayaan Shutes V, PA-C  lisinopril (ZESTRIL) 10 MG tablet Take 10 mg by mouth daily.    [provider]  omeprazole (PRILOSEC) 20 MG capsule Take 20 mg by mouth daily.  09/12/19  [provider]    Family History Family History  Problem Relation Age of Onset   Stroke Mother     Social History Social History   Tobacco Use    Smoking status: Former Smoker    Types: Cigarettes   Smokeless tobacco: Never Used  Building services engineer Use: Never used  Substance Use Topics   Alcohol use: Yes    Alcohol/week: 1.0 standard drink    Types: 1 Cans of beer per week    Comment: social   Drug use: Yes    Frequency: 1.0 times per week    Types: Marijuana     Allergies   Shellfish allergy   Review of Systems Review of Systems  Reason unable to perform ROS: See HPI as above.     Physical Exam Triage Vital Signs ED Triage Vitals  Enc Vitals Group     BP 12/18/19 0951 139/90     Pulse Rate 12/18/19 0951 72     Resp 12/18/19 0951 18     Temp 12/18/19 0951 97.9 F (36.6 C)     Temp Source 12/18/19 0951 Oral     SpO2 12/18/19 0951 95 %     Weight --      Height --      Head Circumference --      Peak Flow --      Pain Score 12/18/19 1001 8     Pain Loc --  Pain Edu? --      Excl. in GC? --    No data found.  Updated Vital Signs BP 139/90 (BP Location: Right Arm)    Pulse 72    Temp 97.9 F (36.6 C) (Oral)    Resp 18    SpO2 95%   Physical Exam Constitutional:      General: He is not in acute distress.    Appearance: Normal appearance. He is well-developed. He is not toxic-appearing or diaphoretic.  HENT:     Head: Normocephalic and atraumatic.     Right Ear: Tympanic membrane, ear canal and external ear normal.     Left Ear: Tympanic membrane, ear canal and external ear normal.     Ears:     Comments: No tragal tenderness to palpation bilaterally.     Mouth/Throat:     Mouth: Mucous membranes are moist.     Pharynx: Oropharynx is clear. Uvula midline.     Comments: No tenderness to the gum. No signs of dental abscess.  Eyes:     Conjunctiva/sclera: Conjunctivae normal.     Pupils: Pupils are equal, round, and reactive to light.  Neck:     Comments: Right lymphadenopathy.  Pulmonary:     Effort: Pulmonary effort is normal. No respiratory distress.  Musculoskeletal:     Cervical  back: Normal range of motion and neck supple.  Skin:    General: Skin is warm and dry.  Neurological:     Mental Status: He is alert and oriented to person, place, and time.      UC Treatments / Results  Labs (all labs ordered are listed, but only abnormal results are displayed) Labs Reviewed - No data to display  EKG   Radiology No results found.  Procedures Procedures (including critical care time)  Medications Ordered in UC Medications - No data to display  Initial Impression / Assessment and Plan / UC Course  I have reviewed the triage vital signs and the nursing notes.  Pertinent labs & imaging results that were available during my care of the patient were reviewed by me and considered in my medical decision making (see chart for details).    Patient complains of ear pain, but points to right neck for pain. Exam with lymphadenopathy of the area. Patient currently without obvious signs of infection. Denies URI symptoms, fever. Had recent dental work done without obvious signs of dental abscess/pain. Will trial NSAIDs and monitoring. Return precautions given. Otherwise close follow up with PCP. Patient expresses understanding and agrees to plan.  Final Clinical Impressions(s) / UC Diagnoses   Final diagnoses:  Lymphadenopathy of right cervical region   ED Prescriptions    Medication Sig Dispense Auth. Provider   ibuprofen (ADVIL) 800 MG tablet Take 1 tablet (800 mg total) by mouth 3 (three) times daily. 21 tablet Belinda Fisher, PA-C     PDMP not reviewed this encounter.   Belinda Fisher, PA-C 12/18/19 1029

## 2019-12-18 NOTE — Discharge Instructions (Signed)
Start ibuprofen as directed. You can take pepcid or other acid reflux medicine to prevent stomach upset. Follow up with dentist for adjustment of your dentures that may be causing some gum discomfort. Monitor for any signs of infection including redness, swelling, drainage.

## 2019-12-18 NOTE — ED Triage Notes (Signed)
Pt c/o rt ear ache with small drainage x3 days.

## 2020-06-12 ENCOUNTER — Ambulatory Visit
Admission: EM | Admit: 2020-06-12 | Discharge: 2020-06-12 | Disposition: A | Discharge status: Home / Self Care | Payer: Non-veteran care | Attending: Emergency Medicine | Admitting: Emergency Medicine

## 2020-06-12 ENCOUNTER — Telehealth: Payer: Self-pay | Admitting: Emergency Medicine

## 2020-06-12 ENCOUNTER — Other Ambulatory Visit: Payer: Self-pay

## 2020-06-12 DIAGNOSIS — T783XXA Angioneurotic edema, initial encounter: Secondary | ICD-10-CM

## 2020-06-12 MED ORDER — PREDNISONE 50 MG PO TABS
50.0000 mg | ORAL_TABLET | Freq: Every day | ORAL | 0 refills | Status: DC
Start: 2020-06-12 — End: 2022-05-22

## 2020-06-12 MED ORDER — AMLODIPINE BESYLATE 5 MG PO TABS
5.0000 mg | ORAL_TABLET | Freq: Every day | ORAL | 0 refills | Status: DC
Start: 2020-06-12 — End: 2022-10-23

## 2020-06-12 NOTE — Telephone Encounter (Signed)
Reports lip swelling spreading to lower lip, discussed half-life of lisinopril, provided prednisone course to trial, declined swelling spreading to tongue, throat or having any difficulty breathing.

## 2020-06-12 NOTE — ED Triage Notes (Signed)
Pt presents with top lip swelling after having an allergic reaction to an unknown source yesterday; pt states he has some tingling, slight pain and tightness bto top lip.

## 2020-06-12 NOTE — Discharge Instructions (Signed)
I suspect this lip swelling is from your lisinopril, stop taking Take amlodipine daily and follow-up with primary care Please ensure lip swelling gradually resolving with time, if developing any worsening swelling, difficulty breathing shortness of breath please return immediately

## 2020-06-12 NOTE — ED Provider Notes (Signed)
[facility]    CSN: [id number] Arrival date & time: 06/12/20  1207      History   Chief Complaint Chief Complaint  Patient presents with  . Allergic Reaction  . Oral Swelling    HPI Maxwell Harris is a 65 y.o. male history of hypertension presenting today for evaluation of allergic reaction.  Reports swelling to lip beginning yesterday.  Has some tingling and slight discomfort in the to his top lip.  Denies any other oral swelling of tongue swelling, throat swelling or any difficulty breathing, shortness of breath.  Denies history of similar allergic reactions.  Denies any new foods, medicines, lip products.  Patient does take lisinopril, last took this morning.  HPI  Past Medical History:  Diagnosis Date  . Chronic GERD   . Hypertension   . Pain     There are no problems to display for this patient.   History reviewed. No pertinent surgical history.     Home Medications    Prior to Admission medications   Medication Sig Start Date End Date Taking? Authorizing Provider  amLODipine (NORVASC) 5 MG tablet Take 1 tablet (5 mg total) by mouth daily. 06/12/20  Yes Olivene Cookston C, PA-C  acetaminophen (TYLENOL) 500 MG tablet Take 500 mg by mouth every 6 (six) hours as needed. joint    [provider]  aspirin 81 MG chewable tablet Chew by mouth daily.    [provider]  cholecalciferol (VITAMIN D) 1000 UNITS tablet Take 1,000 Units by mouth daily.    [provider]  DULoxetine (CYMBALTA) 20 MG capsule Take 20 mg by mouth daily.    [provider]  famotidine (PEPCID) 20 MG tablet Take 1 tablet (20 mg total) by mouth daily. 09/12/19   Cathie Hoops, Amy V, PA-C  gabapentin (NEURONTIN) 300 MG capsule Take 300 mg by mouth 3 (three) times daily.    [provider]  ibuprofen (ADVIL) 800 MG tablet Take 1 tablet (800 mg total) by mouth 3 (three) times daily. 12/18/19   Cathie Hoops, Amy V, PA-C  omeprazole (PRILOSEC) 20 MG capsule Take 20 mg  by mouth daily.  09/12/19  [provider]    Family History Family History  Problem Relation Age of Onset  . Stroke Mother     Social History Social History   Tobacco Use  . Smoking status: Former Smoker    Types: Cigarettes  . Smokeless tobacco: Never Used  Vaping Use  . Vaping Use: Never used  Substance Use Topics  . Alcohol use: Yes    Alcohol/week: 1.0 standard drink    Types: 1 Cans of beer per week    Comment: social  . Drug use: Yes    Frequency: 1.0 times per week    Types: Marijuana     Allergies   Lisinopril and Shellfish allergy   Review of Systems Review of Systems  Constitutional: Negative for fatigue and fever.  HENT: Positive for facial swelling. Negative for trouble swallowing.   Eyes: Negative for redness, itching and visual disturbance.  Respiratory: Negative for shortness of breath.   Cardiovascular: Negative for chest pain and leg swelling.  Gastrointestinal: Negative for nausea and vomiting.  Musculoskeletal: Negative for arthralgias and myalgias.  Skin: Negative for color change, rash and wound.  Neurological: Negative for dizziness, syncope, weakness, light-headedness and headaches.     Physical Exam Triage Vital Signs ED Triage Vitals [06/12/20 1245]  Enc Vitals Group     BP  Pulse      Resp      Temp      Temp src      SpO2      Weight      Height      Head Circumference      Peak Flow      Pain Score 3     Pain Loc      Pain Edu?      Excl. in GC?    No data found.  Updated Vital Signs BP 130/86 (BP Location: Left Arm)   Pulse 80   Temp 98.1 F (36.7 C) (Oral)   Resp 17   SpO2 96%   Visual Acuity Right Eye Distance:   Left Eye Distance:   Bilateral Distance:    Right Eye Near:   Left Eye Near:    Bilateral Near:     Physical Exam Vitals and nursing note reviewed.  Constitutional:      Appearance: He is well-developed.     Comments: No acute distress  HENT:     Head: Normocephalic and  atraumatic.     Nose: Nose normal.     Mouth/Throat:     Comments: Upper lip swelling noted, no other oral swelling, posterior pharynx patent, uvula midline, no soft palate swelling Eyes:     Conjunctiva/sclera: Conjunctivae normal.  Cardiovascular:     Rate and Rhythm: Normal rate and regular rhythm.  Pulmonary:     Effort: Pulmonary effort is normal. No respiratory distress.     Comments: Speaking in full sentences, breathing comfortably at rest  Breathing comfortably at rest, CTABL, no wheezing, rales or other adventitious sounds auscultated Abdominal:     General: There is no distension.  Musculoskeletal:        General: Normal range of motion.     Cervical back: Neck supple.  Skin:    General: Skin is warm and dry.     Comments: No rash noted  Neurological:     Mental Status: He is alert and oriented to person, place, and time.      UC Treatments / Results  Labs (all labs ordered are listed, but only abnormal results are displayed) Labs Reviewed - No data to display  EKG   Radiology No results found.  Procedures Procedures (including critical care time)  Medications Ordered in UC Medications - No data to display  Initial Impression / Assessment and Plan / UC Course  I have reviewed the triage vital signs and the nursing notes.  Pertinent labs & imaging results that were available during my care of the patient were reviewed by me and considered in my medical decision making (see chart for details).     Suspect ACE inhibitor induced angioedema- deferring steroids as I suspect this likely is from lisinopril.  Stopping lisinopril and will switch to amlodipine as alternative antihypertensive.  Monitor for gradual resolution of lip swelling over the next 24 to 48 hours, follow-up if any symptoms progressing or worsening.  Discussed strict return precautions. Patient verbalized understanding and is agreeable with plan.  Final Clinical Impressions(s) / UC Diagnoses    Final diagnoses:  Angioedema, initial encounter     Discharge Instructions     I suspect this lip swelling is from your lisinopril, stop taking Take amlodipine daily and follow-up with primary care Please ensure lip swelling gradually resolving with time, if developing any worsening swelling, difficulty breathing shortness of breath please return immediately    ED Prescriptions  Medication Sig Dispense Auth. Provider   amLODipine (NORVASC) 5 MG tablet Take 1 tablet (5 mg total) by mouth daily. 30 tablet Loyed Wilmes, [place] C, PA-C     PDMP not reviewed this encounter.   Lew Dawes, PA-C 06/12/20 1331

## 2022-03-26 NOTE — Progress Notes (Unsigned)
[place] [phone]   [phone] NOTE  Patient Care Team: Patient, No Pcp Per as PCP - General (General Practice)  Hematological/Oncological History 03/03/2022: Labs from [place]: WBC 6.41, Hgb 7.9 (L), MCV 63.8 (L), Plt 349, Iron 17 (L), TIBC 591 (H), Ferritin 9.9 (L), Saturation 2.9% (L). 03/27/2022: Establish care with [facility] Hematology  CHIEF COMPLAINTS/PURPOSE OF CONSULTATION:  Iron deficiency anemia.  HISTORY OF PRESENTING ILLNESS:  Maxwell Harris 67 y.o. male with medical history significant for hypertension hematology clinic for patient for iron deficiency anemia.  He is unaccompanied for this visit.  On exam today, Maxwell Harris reports that he has been on p.o. iron for over a year.  He does have constipation as a side effect with hard stools.  He is not taking any stool softeners at this time.  Patient does report fatigue which has improved with iron pills.  He is able to complete all his daily activities on his own.  He has a good appetite and denies any dietary restrictions.  He denies nausea, vomiting or abdominal pain.  He reports having shortness of breath maybe mainly with exertion such as going up the stairs.  He has occasional episodes of dizziness without any syncopal episodes.  He does crave ice.  Patient denies fevers, chills or sweats, chest pain or cough.  He has no other complaints.  Rest of the 10 point ROS is below.   MEDICAL HISTORY:  Past Medical History:  Diagnosis Date   Chronic GERD    Hypertension    Pain    Pre-diabetes     SURGICAL HISTORY: History reviewed. No pertinent surgical history.  SOCIAL HISTORY: Social History   Socioeconomic History   Marital status: Single    Spouse name: Not on file   Number of children: Not on file   Years of education: Not on file   Highest education level: Not on file  Occupational History   Not on file  Tobacco Use   Smoking status: Former    Years: 5.00     Types: Cigarettes   Smokeless tobacco: Never   Tobacco comments:    Not able to remember when he quit  Vaping Use   Vaping Use: Never used  Substance and Sexual Activity   Alcohol use: Yes    Alcohol/week: 1.0 standard drink of alcohol    Types: 1 Cans of beer per week    Comment: social   Drug use: Yes    Frequency: 1.0 times per week    Types: Marijuana   Sexual activity: Not on file  Other Topics Concern   Not on file  Social History Narrative   Not on file   Social Determinants of Health   Financial Resource Strain: Not on file  Food Insecurity: Not on file  Transportation Needs: Not on file  Physical Activity: Not on file  Stress: Not on file  Social Connections: Not on file  Intimate Partner Violence: Not on file    FAMILY HISTORY: Family History  Problem Relation Age of Onset   Stroke Mother     ALLERGIES:  is allergic to lisinopril and shellfish allergy.  MEDICATIONS:  Current Outpatient Medications  Medication Sig Dispense Refill   acetaminophen (TYLENOL) 500 MG tablet Take 500 mg by mouth every 6 (six) hours as needed. joint     amLODipine (NORVASC) 5 MG tablet Take 1 tablet (5 mg total) by mouth daily. 30 tablet 0   aspirin 81 MG  chewable tablet Chew by mouth daily.     cholecalciferol (VITAMIN D) 1000 UNITS tablet Take 1,000 Units by mouth daily.     DULoxetine (CYMBALTA) 20 MG capsule Take 20 mg by mouth daily.     famotidine (PEPCID) 20 MG tablet Take 1 tablet (20 mg total) by mouth daily. 14 tablet 0   gabapentin (NEURONTIN) 300 MG capsule Take 300 mg by mouth 3 (three) times daily.     ibuprofen (ADVIL) 800 MG tablet Take 1 tablet (800 mg total) by mouth 3 (three) times daily. 21 tablet 0   predniSONE (DELTASONE) 50 MG tablet Take 1 tablet (50 mg total) by mouth daily with breakfast. (Patient not taking: Reported on 03/27/2022) 5 tablet 0   No current facility-administered medications for this visit.    REVIEW OF SYSTEMS:   Constitutional: ( - )  fevers, ( - )  chills , ( - ) night sweats Eyes: ( - ) blurriness of vision, ( - ) double vision, ( - ) watery eyes Ears, nose, mouth, throat, and face: ( - ) mucositis, ( - ) sore throat Respiratory: ( - ) cough, (+) dyspnea, ( - ) wheezes Cardiovascular: ( - ) palpitation, ( - ) chest discomfort, ( - ) lower extremity swelling Gastrointestinal:  ( - ) nausea, ( - ) heartburn, ( - ) change in bowel habits Skin: ( - ) abnormal skin rashes Lymphatics: ( - ) new lymphadenopathy, ( - ) easy bruising Neurological: ( - ) numbness, ( - ) tingling, ( - ) new weaknesses Behavioral/Psych: ( - ) mood change, ( - ) new changes  All other systems were reviewed with the patient and are negative.  PHYSICAL EXAMINATION: ECOG PERFORMANCE STATUS: 1 - Symptomatic but completely ambulatory  Vitals:   03/27/22 0858  BP: (!) 144/82  Pulse: 86  Resp: 16  Temp: 97.6 F (36.4 C)  SpO2: 98%   Filed Weights   03/27/22 0858  Weight: 233 lb 9.6 oz (106 kg)    GENERAL: well appearing male in NAD  SKIN: skin color, texture, turgor are normal, no rashes or significant lesions EYES: conjunctiva are pink and non-injected, sclera clear OROPHARYNX: no exudate, no erythema; lips, buccal mucosa, and tongue normal  NECK: supple, non-tender LYMPH:  no palpable lymphadenopathy in the cervical or supraclavicular lymph nodes.  LUNGS: clear to auscultation and percussion with normal breathing effort HEART: regular rate & rhythm and no murmurs and no lower extremity edema Musculoskeletal: no cyanosis of digits and no clubbing  PSYCH: alert & oriented x 3, fluent speech NEURO: no focal motor/sensory deficits  LABORATORY DATA:  I have reviewed the data as listed    Latest Ref Rng & Units 07/03/2016   10:24 PM 09/15/2009   10:30 AM  CBC  WBC 4.0 - 10.5 K/uL 8.2  8.9   Hemoglobin 13.0 - 17.0 g/dL 23.5  57.3   Hematocrit 39.0 - 52.0 % 37.5  40.8   Platelets 150 - 400 K/uL 245  257        Latest Ref Rng & Units  07/03/2016   10:24 PM  CMP  Glucose 65 - 99 mg/dL 220   BUN 6 - 20 mg/dL 12   Creatinine 2.54 - 1.24 mg/dL 2.70   Sodium 623 - 762 mmol/L 137   Potassium 3.5 - 5.1 mmol/L 3.9   Chloride 101 - 111 mmol/L 107   CO2 22 - 32 mmol/L 19   Calcium 8.9 - 10.3 mg/dL 9.3  ASSESSMENT & PLAN Maxwell Harris is a 67 y.o. male who presents to the hematology clinic for evaluation for iron deficiency anemia.  We discussed possible etiologies for iron deficiency including malabsorption versus GI blood loss.  Patient underwent endoscopic evaluation through the [place] in June 2021.  Colonoscopy was normal but EGD did show reactive gastropathy.  Patient has been compliant with taking p.o. iron for over a year.  Patient will proceed with laboratory evaluation today to determine if patient is a candidate for IV iron infusions.  We will follow-up with the patient by phone to discuss the results and our recommendations.  # Iron deficiency anemia: --Last colonoscopy in June 2021 that was normal. EGD showed reactive gastropathy.  We recommended follow-up with gastroenterology.  Discussed following up with [facility] gastroenterology versus new referral.  Patient is interested in referrals to going to [facility] gastro[facility]ology. -- Labs today to check CBC, CMP, reticulocyte panel, ferritin, iron and TIBC.  We will send a sample to blood bank in case patient needs blood transfusion. --Based on today's blood work we will determine if patient is a candidate for IV iron. --Okay to discontinue p.o. iron supplementation due to constipation. --Gave list of iron rich foods to incorporate into diet. --Tentative plan to return to clinic in 8 weeks with labs and follow-up visit.   Orders Placed This Encounter  Procedures   CBC with Differential ([place] Only)    Standing Status:   Future    Standing Expiration Date:   03/27/2023   CMP ([place] only)    Standing Status:   Future    Standing Expiration Date:   03/27/2023    Ferritin    Standing Status:   Future    Standing Expiration Date:   03/27/2023   Iron and Iron Binding Capacity ([facility] only)    Standing Status:   Future    Standing Expiration Date:   03/27/2023   Retic Panel    Standing Status:   Future    Standing Expiration Date:   03/27/2023   Ambulatory referral to Gastroenterology    Referral Priority:   Urgent    Referral Type:   Consultation    Referral Reason:   Specialty Services Required    Number of Visits Requested:   1   Sample to Blood Bank    Standing Status:   Future    Standing Expiration Date:   03/28/2023    All questions were answered. The patient knows to call the clinic with any problems, questions or concerns.  I have spent a total of 60 minutes minutes of face-to-face and non-face-to-face time, preparing to see the patient, obtaining and/or reviewing separately obtained history, performing a medically appropriate examination, counseling and educating the patient, ordering medications/tests/procedures, referring and communicating with other health care professionals, documenting clinical information in the electronic health record,  and care coordination.   Dede Query, PA-C Department of Hematology/Oncology [place] at [facility] Phone: [phone]  Patient was seen with Dr. Lorenso Courier  I have read the above note and personally examined the patient. I agree with the assessment and plan as noted above.  Briefly Maxwell Harris is a 67 year old male who presents for evaluation of iron deficiency anemia of unclear etiology.  At this time strongly suspect GI etiology.  Patient is undergone prior EGD and colonoscopy, with the latter being in 2021.  endoscopy is yet been performed.  Will make referral to [facility] for further evaluation.  Patient  has been struggling with constipation due to his p.o. iron therapy and therefore would recommend pursuing IV iron treatment.  Today we will order full iron  studies to include iron panel, ferritin, and repeat CBC.  The patient voiced understanding of our findings and the plan moving forward.   Ledell Peoples, MD Department of Hematology/Oncology [place] at [facility] Phone: [phone] Pager: [phone] Email: [email or web address]

## 2022-03-27 ENCOUNTER — Telehealth: Payer: Self-pay | Admitting: Pharmacy Technician

## 2022-03-27 ENCOUNTER — Inpatient Hospital Stay: Payer: Medicare Other

## 2022-03-27 ENCOUNTER — Encounter: Payer: Self-pay | Admitting: Physician Assistant

## 2022-03-27 ENCOUNTER — Telehealth: Payer: Self-pay

## 2022-03-27 ENCOUNTER — Inpatient Hospital Stay: Payer: Medicare Other | Attending: Physician Assistant | Admitting: Physician Assistant

## 2022-03-27 ENCOUNTER — Other Ambulatory Visit: Payer: Self-pay

## 2022-03-27 VITALS — BP 144/82 | HR 86 | Temp 97.6°F | Resp 16 | Wt 233.6 lb

## 2022-03-27 DIAGNOSIS — I10 Essential (primary) hypertension: Secondary | ICD-10-CM | POA: Diagnosis not present

## 2022-03-27 DIAGNOSIS — K5900 Constipation, unspecified: Secondary | ICD-10-CM | POA: Diagnosis not present

## 2022-03-27 DIAGNOSIS — Z87891 Personal history of nicotine dependence: Secondary | ICD-10-CM | POA: Insufficient documentation

## 2022-03-27 DIAGNOSIS — D509 Iron deficiency anemia, unspecified: Secondary | ICD-10-CM | POA: Insufficient documentation

## 2022-03-27 LAB — CMP (CANCER CENTER ONLY)
ALT: 37 U/L (ref 0–44)
AST: 25 U/L (ref 15–41)
Albumin: 4.1 g/dL (ref 3.5–5.0)
Alkaline Phosphatase: 82 U/L (ref 38–126)
Anion gap: 9 (ref 5–15)
BUN: 15 mg/dL (ref 8–23)
CO2: 27 mmol/L (ref 22–32)
Calcium: 9.7 mg/dL (ref 8.9–10.3)
Chloride: 102 mmol/L (ref 98–111)
Creatinine: 1.03 mg/dL (ref 0.61–1.24)
GFR, Estimated: >60 mL/min (ref >60)
Glucose, Bld: 118 mg/dL — ABNORMAL HIGH (ref 70–99)
Potassium: 3.7 mmol/L (ref 3.5–5.1)
Sodium: 138 mmol/L (ref 135–145)
Total Bilirubin: 0.3 mg/dL (ref 0.3–1.2)
Total Protein: 7.5 g/dL (ref 6.5–8.1)

## 2022-03-27 LAB — IRON AND IRON BINDING CAPACITY (CC-WL,HP ONLY)
Iron: 166 ug/dL (ref 45–182)
Saturation Ratios: 27 % (ref 17.9–39.5)
TIBC: 617 ug/dL — ABNORMAL HIGH (ref 250–450)
UIBC: 451 ug/dL — ABNORMAL HIGH (ref 117–376)

## 2022-03-27 LAB — CBC WITH DIFFERENTIAL (CANCER CENTER ONLY)
Abs Immature Granulocytes: 0.02 10*3/uL (ref 0.00–0.07)
Basophils Absolute: 0.1 10*3/uL (ref 0.0–0.1)
Basophils Relative: 1 %
Eosinophils Absolute: 0.3 10*3/uL (ref 0.0–0.5)
Eosinophils Relative: 4 %
HCT: 29.5 % — ABNORMAL LOW (ref 39.0–52.0)
Hemoglobin: 8.9 g/dL — ABNORMAL LOW (ref 13.0–17.0)
Immature Granulocytes: 0 %
Lymphocytes Relative: 19 %
Lymphs Abs: 1.2 10*3/uL (ref 0.7–4.0)
MCH: 19.6 pg — ABNORMAL LOW (ref 26.0–34.0)
MCHC: 30.2 g/dL (ref 30.0–36.0)
MCV: 65 fL — ABNORMAL LOW (ref 80.0–100.0)
Monocytes Absolute: 0.9 10*3/uL (ref 0.1–1.0)
Monocytes Relative: 14 %
Neutro Abs: 4 10*3/uL (ref 1.7–7.7)
Neutrophils Relative %: 62 %
Platelet Count: 439 10*3/uL — ABNORMAL HIGH (ref 150–400)
RBC: 4.54 MIL/uL (ref 4.22–5.81)
RDW: 21.2 % — ABNORMAL HIGH (ref 11.5–15.5)
WBC Count: 6.4 10*3/uL (ref 4.0–10.5)
nRBC: 0 % (ref 0.0–0.2)

## 2022-03-27 LAB — RETIC PANEL
Immature Retic Fract: 40.7 % — ABNORMAL HIGH (ref 2.3–15.9)
RBC.: 4.49 MIL/uL (ref 4.22–5.81)
Retic Count, Absolute: 92.5 10*3/uL (ref 19.0–186.0)
Retic Ct Pct: 2.1 % (ref 0.4–3.1)
Reticulocyte Hemoglobin: 22.6 pg — ABNORMAL LOW (ref >27.9)

## 2022-03-27 LAB — FERRITIN: Ferritin: 12 ng/mL — ABNORMAL LOW (ref 24–336)

## 2022-03-27 LAB — SAMPLE TO BLOOD BANK

## 2022-03-27 NOTE — Telephone Encounter (Addendum)
Called patient and relayed the message below. Patient had a full understanding      ----- Message from Lincoln Brigham, PA-C sent at 03/27/2022 11:26 AM EST ----- Please notify patient that labs confirm iron deficiency anemia. We will arrange for IV iron infusion at [facility]. Their schedulers will contact him for IV iron once they have insurance authorization. I will see him back in 8 weeks for l abs and follow up.

## 2022-03-27 NOTE — Telephone Encounter (Addendum)
community of care approval. Phone: [phone] Haywood Lasso)  Auth Submission: APPROVED Payer: community of care approval - [place] Medication & CPT/J Code(s) submitted:  MONOFERRIC Route of submission (phone, fax, portal):  Phone # [phone] Fax # Auth type: Buy/Bill Units/visits requested: X1 Reference number:  Approval from:03/27/22   to  04/25/22

## 2022-03-30 ENCOUNTER — Telehealth: Payer: Self-pay

## 2022-03-30 NOTE — Telephone Encounter (Signed)
[facility] will call him to schedule IV iron. I will see patient him in 8 weeks for labs and follow up afterwards. [facility] left a voicemail to schedule a consultation with him.

## 2022-03-30 NOTE — Telephone Encounter (Signed)
Patient called to follow-up on visit from 03/27/22. Patient is asking what the plan will be moving forward.  Routed to [place] T. PA

## 2022-03-30 NOTE — Telephone Encounter (Signed)
Called patient to advise of message below. Provided patient with phone number to [facility] so that he can return their phone call to schedule initial consult.

## 2022-03-31 ENCOUNTER — Telehealth: Payer: Self-pay | Admitting: Physician Assistant

## 2022-03-31 ENCOUNTER — Telehealth: Payer: Self-pay

## 2022-03-31 ENCOUNTER — Encounter: Payer: Self-pay | Admitting: Physician Assistant

## 2022-03-31 NOTE — Telephone Encounter (Signed)
T/C from pt requesting his records from his 03/27/22 consultation be faxed to his PCP at the [place].  PCP Dr. Herold Harms added and records faxed.  Confirmation received

## 2022-03-31 NOTE — Telephone Encounter (Signed)
Called patient to confirm new appointments. Voicemail not set up. Mailing patient reminders.

## 2022-04-07 ENCOUNTER — Ambulatory Visit (INDEPENDENT_AMBULATORY_CARE_PROVIDER_SITE_OTHER): Payer: No Typology Code available for payment source

## 2022-04-07 VITALS — BP 124/69 | HR 81 | Temp 98.5°F | Resp 16 | Ht 73.0 in | Wt 233.6 lb

## 2022-04-07 DIAGNOSIS — D509 Iron deficiency anemia, unspecified: Secondary | ICD-10-CM

## 2022-04-07 DIAGNOSIS — D508 Other iron deficiency anemias: Secondary | ICD-10-CM

## 2022-04-07 MED ORDER — SODIUM CHLORIDE 0.9 % IV SOLN
1000.0000 mg | Freq: Once | INTRAVENOUS | Status: AC
  Administered 2022-04-07: 1000 mg via INTRAVENOUS
  Filled 2022-04-07: qty 10

## 2022-04-07 MED ORDER — ACETAMINOPHEN 325 MG PO TABS
650.0000 mg | ORAL_TABLET | Freq: Once | ORAL | Status: AC
  Administered 2022-04-07: 650 mg via ORAL
  Filled 2022-04-07: qty 2

## 2022-04-07 NOTE — Patient Instructions (Signed)

## 2022-04-07 NOTE — Progress Notes (Signed)
Diagnosis: Iron Deficiency Anemia  Provider:  Marshell Garfinkel MD  Procedure: Infusion  IV Type: Peripheral, IV Location: L Antecubital  Monoferric (Ferric Derisomaltose), Dose: 1000 mg  Infusion Start Time: [id number]  Infusion Stop Time: 1016  Post Infusion IV Care: Peripheral IV Discontinued  Discharge: Condition: Good, Destination: Home . AVS Provided  Performed by:  Cleophus Molt, RN

## 2022-05-21 ENCOUNTER — Other Ambulatory Visit: Payer: Self-pay | Admitting: Physician Assistant

## 2022-05-21 DIAGNOSIS — D509 Iron deficiency anemia, unspecified: Secondary | ICD-10-CM

## 2022-05-22 ENCOUNTER — Inpatient Hospital Stay (HOSPITAL_BASED_OUTPATIENT_CLINIC_OR_DEPARTMENT_OTHER): Payer: Medicare Other | Admitting: Physician Assistant

## 2022-05-22 ENCOUNTER — Inpatient Hospital Stay: Payer: Medicare Other | Attending: Physician Assistant

## 2022-05-22 ENCOUNTER — Other Ambulatory Visit: Payer: Self-pay

## 2022-05-22 VITALS — BP 140/85 | HR 83 | Temp 97.3°F | Resp 20 | Wt 230.7 lb

## 2022-05-22 DIAGNOSIS — I10 Essential (primary) hypertension: Secondary | ICD-10-CM | POA: Insufficient documentation

## 2022-05-22 DIAGNOSIS — D509 Iron deficiency anemia, unspecified: Secondary | ICD-10-CM | POA: Diagnosis present

## 2022-05-22 DIAGNOSIS — Z87891 Personal history of nicotine dependence: Secondary | ICD-10-CM | POA: Insufficient documentation

## 2022-05-22 LAB — CMP (CANCER CENTER ONLY)
ALT: 28 U/L (ref 0–44)
AST: 24 U/L (ref 15–41)
Albumin: 4.6 g/dL (ref 3.5–5.0)
Alkaline Phosphatase: 69 U/L (ref 38–126)
Anion gap: 7 (ref 5–15)
BUN: 14 mg/dL (ref 8–23)
CO2: 29 mmol/L (ref 22–32)
Calcium: 9.6 mg/dL (ref 8.9–10.3)
Chloride: 102 mmol/L (ref 98–111)
Creatinine: 1.03 mg/dL (ref 0.61–1.24)
GFR, Estimated: >60 mL/min (ref >60)
Glucose, Bld: 113 mg/dL — ABNORMAL HIGH (ref 70–99)
Potassium: 4 mmol/L (ref 3.5–5.1)
Sodium: 138 mmol/L (ref 135–145)
Total Bilirubin: 0.3 mg/dL (ref 0.3–1.2)
Total Protein: 8.1 g/dL (ref 6.5–8.1)

## 2022-05-22 LAB — CBC WITH DIFFERENTIAL (CANCER CENTER ONLY)
Abs Immature Granulocytes: 0.02 10*3/uL (ref 0.00–0.07)
Basophils Absolute: 0 10*3/uL (ref 0.0–0.1)
Basophils Relative: 1 %
Eosinophils Absolute: 0.1 10*3/uL (ref 0.0–0.5)
Eosinophils Relative: 3 %
HCT: 38.4 % — ABNORMAL LOW (ref 39.0–52.0)
Hemoglobin: 12.6 g/dL — ABNORMAL LOW (ref 13.0–17.0)
Immature Granulocytes: 0 %
Lymphocytes Relative: 19 %
Lymphs Abs: 0.9 10*3/uL (ref 0.7–4.0)
MCH: 24.7 pg — ABNORMAL LOW (ref 26.0–34.0)
MCHC: 32.8 g/dL (ref 30.0–36.0)
MCV: 75.3 fL — ABNORMAL LOW (ref 80.0–100.0)
Monocytes Absolute: 0.7 10*3/uL (ref 0.1–1.0)
Monocytes Relative: 15 %
Neutro Abs: 3 10*3/uL (ref 1.7–7.7)
Neutrophils Relative %: 62 %
Platelet Count: 277 10*3/uL (ref 150–400)
RBC: 5.1 MIL/uL (ref 4.22–5.81)
WBC Count: 4.8 10*3/uL (ref 4.0–10.5)
nRBC: 0 % (ref 0.0–0.2)

## 2022-05-22 LAB — FERRITIN: Ferritin: 36 ng/mL (ref 24–336)

## 2022-05-22 LAB — IRON AND IRON BINDING CAPACITY (CC-WL,HP ONLY)
Iron: 66 ug/dL (ref 45–182)
Saturation Ratios: 13 % — ABNORMAL LOW (ref 17.9–39.5)
TIBC: 512 ug/dL — ABNORMAL HIGH (ref 250–450)
UIBC: 446 ug/dL — ABNORMAL HIGH (ref 117–376)

## 2022-05-22 NOTE — Progress Notes (Signed)
[place] [phone]   [phone]) [phone]  PROGRESS NOTE  Patient Care Team: Charlsie Merles, MD as PCP - General (Internal Medicine)  Hematological/Oncological History 03/03/2022: Labs from [place]: WBC 6.41, Hgb 7.9 (L), MCV 63.8 (L), Plt 349, Iron 17 (L), TIBC 591 (H), Ferritin 9.9 (L), Saturation 2.9% (L). 03/27/2022: Establish care with [facility] Hematology 04/07/2022: Received IV monoferric 1000 mg x 1 dose  CHIEF COMPLAINTS/PURPOSE OF CONSULTATION:  Iron deficiency anemia.  HISTORY OF PRESENTING ILLNESS:  Maxwell Harris 67 y.o. male returns for a follow up for iron deficiency anemia.  He was last seen on 03/27/2022 to establish care. In the interim, he received IV monoferric x 1 dose on 04/07/2022. He is unaccompanied for this visit.  On exam today, Mr. Lingenfelter reports his energy did improve some with the IV iron infusion. He continues to complete his ADLs on his own. He has a good appetite and denies any dietary restriction. He denies nausea, vomiting or abdominal pain. Bowel habits are unchanged. He denies easy bruising or signs of bleeding.  Patient denies fevers, chills or sweats, shortness of breath, chest pain or cough.  He has no other complaints.  Rest of the 10 point ROS is below.   MEDICAL HISTORY:  Past Medical History:  Diagnosis Date   Chronic GERD    Hypertension    Pain    Pre-diabetes     SURGICAL HISTORY: No past surgical history on file.  SOCIAL HISTORY: Social History   Socioeconomic History   Marital status: Single    Spouse name: Not on file   Number of children: Not on file   Years of education: Not on file   Highest education level: Not on file  Occupational History   Not on file  Tobacco Use   Smoking status: Former    Years: 5    Types: Cigarettes   Smokeless tobacco: Never   Tobacco comments:    Not able to remember when he quit  Vaping Use   Vaping Use: Never used  Substance and Sexual Activity   Alcohol use:  Yes    Alcohol/week: 1.0 standard drink of alcohol    Types: 1 Cans of beer per week    Comment: social   Drug use: Yes    Frequency: 1.0 times per week    Types: Marijuana   Sexual activity: Not on file  Other Topics Concern   Not on file  Social History Narrative   Not on file   Social Determinants of Health   Financial Resource Strain: Not on file  Food Insecurity: Not on file  Transportation Needs: Not on file  Physical Activity: Not on file  Stress: Not on file  Social Connections: Not on file  Intimate Partner Violence: Not on file    FAMILY HISTORY: Family History  Problem Relation Age of Onset   Stroke Mother     ALLERGIES:  is allergic to lisinopril and shellfish allergy.  MEDICATIONS:  Current Outpatient Medications  Medication Sig Dispense Refill   acetaminophen (TYLENOL) 500 MG tablet Take 500 mg by mouth every 6 (six) hours as needed. joint     amLODipine (NORVASC) 5 MG tablet Take 1 tablet (5 mg total) by mouth daily. 30 tablet 0   aspirin 81 MG chewable tablet Chew by mouth daily.     cholecalciferol (VITAMIN D) 1000 UNITS tablet Take 1,000 Units by mouth daily.     DULoxetine (CYMBALTA) 20 MG capsule Take 20 mg by  mouth daily.     famotidine (PEPCID) 20 MG tablet Take 1 tablet (20 mg total) by mouth daily. 14 tablet 0   gabapentin (NEURONTIN) 300 MG capsule Take 300 mg by mouth 3 (three) times daily.     OMEPRAZOLE PO Take by mouth daily.     ibuprofen (ADVIL) 800 MG tablet Take 1 tablet (800 mg total) by mouth 3 (three) times daily. (Patient not taking: Reported on 05/22/2022) 21 tablet 0   predniSONE (DELTASONE) 50 MG tablet Take 1 tablet (50 mg total) by mouth daily with breakfast. (Patient not taking: Reported on 03/27/2022) 5 tablet 0   No current facility-administered medications for this visit.    REVIEW OF SYSTEMS:   Constitutional: ( - ) fevers, ( - )  chills , ( - ) night sweats Eyes: ( - ) blurriness of vision, ( - ) double vision, ( - )  watery eyes Ears, nose, mouth, throat, and face: ( - ) mucositis, ( - ) sore throat Respiratory: ( - ) cough, (-) dyspnea, ( - ) wheezes Cardiovascular: ( - ) palpitation, ( - ) chest discomfort, ( - ) lower extremity swelling Gastrointestinal:  ( - ) nausea, ( - ) heartburn, ( - ) change in bowel habits Skin: ( - ) abnormal skin rashes Lymphatics: ( - ) new lymphadenopathy, ( - ) easy bruising Neurological: ( - ) numbness, ( - ) tingling, ( - ) new weaknesses Behavioral/Psych: ( - ) mood change, ( - ) new changes  All other systems were reviewed with the patient and are negative.  PHYSICAL EXAMINATION: ECOG PERFORMANCE STATUS: 0 - Asymptomatic  Vitals:   05/22/22 1353  BP: (!) 140/85  Pulse: 83  Resp: 20  Temp: (!) 97.3 F (36.3 C)   Filed Weights   05/22/22 1353  Weight: 230 lb 11.2 oz (104.6 kg)    GENERAL: well appearing male in NAD  SKIN: skin color, texture, turgor are normal, no rashes or significant lesions EYES: conjunctiva are pink and non-injected, sclera clear LUNGS: clear to auscultation and percussion with normal breathing effort HEART: regular rate & rhythm and no murmurs and no lower extremity edema Musculoskeletal: no cyanosis of digits and no clubbing  PSYCH: alert & oriented x 3, fluent speech NEURO: no focal motor/sensory deficits  LABORATORY DATA:  I have reviewed the data as listed    Latest Ref Rng & Units 05/22/2022    1:12 PM 03/27/2022    9:57 AM 07/03/2016   10:24 PM  CBC  WBC 4.0 - 10.5 K/uL 4.8  6.4  8.2   Hemoglobin 13.0 - 17.0 g/dL 12.6  8.9  12.8   Hematocrit 39.0 - 52.0 % 38.4  29.5  37.5   Platelets 150 - 400 K/uL 277  439  245        Latest Ref Rng & Units 05/22/2022    1:12 PM 03/27/2022    9:57 AM 07/03/2016   10:24 PM  CMP  Glucose 70 - 99 mg/dL 113  118  135   BUN 8 - 23 mg/dL 14  15  12    Creatinine 0.61 - 1.24 mg/dL 1.03  1.03  1.19   Sodium 135 - 145 mmol/L 138  138  137   Potassium 3.5 - 5.1 mmol/L 4.0  3.7  3.9    Chloride 98 - 111 mmol/L 102  102  107   CO2 22 - 32 mmol/L 29  27  19    Calcium 8.9 - 10.3  mg/dL 9.6  9.7  9.3   Total Protein 6.5 - 8.1 g/dL 8.1  7.5    Total Bilirubin 0.3 - 1.2 mg/dL 0.3  0.3    Alkaline Phos 38 - 126 U/L 69  82    AST 15 - 41 U/L 24  25    ALT 0 - 44 U/L 28  37     ASSESSMENT & PLAN Stepfan Brining is a 67 y.o. male who presents to for a follow up for iron deficiency anemia.    # Iron deficiency anemia: --Last colonoscopy in June 2021 that was normal. EGD showed reactive gastropathy.  We recommended follow-up with gastroenterology, scheduled for consultation with [facility] on 06/19/2022.  --Received IV monoferric 1000 mg x 1 dose on 04/07/2022.  --Unable to tolerate p.o. iron supplementation due to constipation. --Labs today show anemia has improved with Hgb improving from 8.9 to 12.6. Plts back to normal. Iron studies still show some deficiency with iron saturation 13%, TIBC 512, ferritin levels pending.  --Recommend another round of IV monoferric 1000 mg x 1 dose to help bolster iron and Hgb levels.  --RTC in 3 months for lab only visit and 6 months for labs/follow up.   No orders of the defined types were placed in this encounter.   All questions were answered. The patient knows to call the clinic with any problems, questions or concerns.  I have spent a total of 30 minutes minutes of face-to-face and non-face-to-face time, preparing to see the patient,  performing a medically appropriate examination, counseling and educating the patient, ordering medications/tests/procedures, documenting clinical information in the electronic health record,  and care coordination.   Dede Query, PA-C Department of Hematology/Oncology [place] at [facility] Phone: [phone]

## 2022-05-25 ENCOUNTER — Telehealth: Payer: Self-pay | Admitting: Physician Assistant

## 2022-05-25 NOTE — Telephone Encounter (Signed)
Reached out to patient to schedule per 3/29 LOS. Patient aware of date and times of appointments.

## 2022-05-26 ENCOUNTER — Telehealth: Payer: Self-pay | Admitting: Pharmacy Technician

## 2022-05-26 NOTE — Telephone Encounter (Signed)
Kristin Bruins note:  Auth Submission: APPROVED Site of care: Site of care: [facility] [place]ayer: [place] Medication & CPT/J Code(s) submitted: Monoferric (Ferrci derisomaltose) [phone] Route of submission (phone, fax, portal):  Phone # Fax # Auth type: Buy/Bill Units/visits requested: 1 Reference number: [id number] Approval from: 05/25/22 to 03/27/23    Patient will be scheduled as soon as possible

## 2022-05-28 ENCOUNTER — Telehealth: Payer: Self-pay

## 2022-05-28 ENCOUNTER — Other Ambulatory Visit: Payer: Self-pay | Admitting: Nurse Practitioner

## 2022-05-28 DIAGNOSIS — M5416 Radiculopathy, lumbar region: Secondary | ICD-10-CM

## 2022-05-28 NOTE — Telephone Encounter (Signed)
Pt advised with VU 

## 2022-05-28 NOTE — Telephone Encounter (Signed)
-----   Message from Lincoln Brigham, PA-C sent at 05/28/2022  1:53 PM EDT ----- Please notify patient that anemia has improved but iron levels are still low. We will proceed with another round of IV iron to improve iron and Hgb levels.    ----- Message ----- From: Interface, Lab In [place] Sent: 05/22/2022   1:32 PM EDT To: Lincoln Brigham, PA-C

## 2022-06-03 ENCOUNTER — Ambulatory Visit (INDEPENDENT_AMBULATORY_CARE_PROVIDER_SITE_OTHER): Payer: No Typology Code available for payment source

## 2022-06-03 VITALS — BP 118/74 | HR 76 | Temp 98.0°F | Resp 20 | Ht 73.0 in | Wt 233.4 lb

## 2022-06-03 DIAGNOSIS — D508 Other iron deficiency anemias: Secondary | ICD-10-CM

## 2022-06-03 DIAGNOSIS — D509 Iron deficiency anemia, unspecified: Secondary | ICD-10-CM

## 2022-06-03 MED ORDER — SODIUM CHLORIDE 0.9 % IV SOLN
1000.0000 mg | Freq: Once | INTRAVENOUS | Status: AC
  Administered 2022-06-03: 1000 mg via INTRAVENOUS
  Filled 2022-06-03: qty 10

## 2022-06-03 NOTE — Progress Notes (Signed)
Diagnosis: Iron Deficiency Anemia  Provider:  Chilton Greathouse MD  Procedure: Infusion  IV Type: Peripheral, IV Location: R Antecubital  Monoferric (Ferric Derisomaltose), Dose: 1000 mg  Infusion Start Time: 1042  Infusion Stop Time: 1105  Post Infusion IV Care: Peripheral IV Discontinued  Discharge: Condition: Good, Destination: Home . AVS Declined  Performed by:  Marlow Baars [place], RN

## 2022-06-10 ENCOUNTER — Encounter: Payer: Self-pay | Admitting: Physician Assistant

## 2022-06-17 NOTE — Progress Notes (Signed)
06/19/2022 Sherlean Foot [id number] January 13, 1956  Referring provider: Sherwood Gambler, MD Primary GI doctor: Dr. Tomasa Rand  ASSESSMENT AND PLAN:   Iron deficiency anemia due to chronic blood loss Unable to see report from 2021 colon/EGD however showed reactive gastropathy and unremarkable colon Patient had HGB of 9 two months ago, started on oral iron which he is tolerating and recheck with hematology showed ferritin 36 and HGB of 12 Will proceed with upper gastrointestinal and colorectal evaluation with an EGD and colonoscopy to rule out gastritis, PUD, gastropathy with ETOH history, AVM's, malignancy.  Appropriate at the [facility], scheduled with Dr. Tomasa Rand for sooner appointment.  Risk of bowel prep, conscious sedation, and EGD and colonoscopy were discussed.  Risks include but are not limited to dehydration, pain, bleeding, cardiopulmonary process, bowel perforation, or other possible adverse outcomes..  Treatment plan was discussed with patient, and agreed upon.  Gastroesophageal reflux disease without esophagitis Lifestyle changes discussed, avoid NSAIDS, ETOH Continue on the same medication, reports symptoms are well controlled.   Alcohol use No elevation of LFTs Normal platelets.  -Alcohol Abstinence counseling discussed with patient    Patient Care Team: Borum, Vista Mink, MD as PCP - General (Internal Medicine)  HISTORY OF PRESENT ILLNESS: 67 y.o. male with a past medical history of hypertension, prediabetes, GERD and others listed below presents for evaluation of IDA.   Patient has been following with hematology for iron deficiency anemia. 03/03/2022 labs from [place] showed Hgb 7.9, MCV 63.8, normal platelets, iron 17, ferritin 9.9, saturation 2.9. 04/07/2022 received IV Monoferric 1000 mg 1 dose  08/13/2019 colonoscopy and EGD  Colonoscopy unremarkable EGD showed reactive gastropathy  States he has had 3-4 colonoscopies total, has had EGD twice.  Patient  denies family history of colon cancer or other gastrointestinal malignancies. He has history of GERD but he is on omeprazole 40 mg daily daily and it is controlled with this. No reflux,no dysphagia.  He is on iron pills so states his stool is dark but denies melena.  He normally has BM every other day, can be very hard stool to formed soft.  Can have AB bloating after eating large volume.  He was told he had hemorrhoids a long time ago, denies hematochezia or blood in his stool.  He states a week ago he had a stomach virus, but improved after a week, he had upper left AB pain and diarrhea but this improved.   He denies blood thinner use.  He denies NSAID use.  He reports ETOH use, 2-3 shots tequila "forever".  He denies tobacco use.  He reports drug use, marijuana daily but denies any other drug use.    He  reports that he has quit smoking. His smoking use included cigarettes. He has never used smokeless tobacco. He reports current alcohol use of about 1.0 standard drink of alcohol per week. He reports current drug use. Frequency: 1.00 time per week. Drug: Marijuana.  RELEVANT LABS AND IMAGING: CBC    Component Value Date/Time   WBC 4.8 05/22/2022 1312   WBC 8.2 07/03/2016 2224   RBC 5.10 05/22/2022 1312   HGB 12.6 (L) 05/22/2022 1312   HCT 38.4 (L) 05/22/2022 1312   PLT 277 05/22/2022 1312   MCV 75.3 (L) 05/22/2022 1312   MCH 24.7 (L) 05/22/2022 1312   MCHC 32.8 05/22/2022 1312   RDW Not Measured 05/22/2022 1312   LYMPHSABS 0.9 05/22/2022 1312   MONOABS 0.7 05/22/2022 1312   EOSABS 0.1 05/22/2022 1312  BASOSABS 0.0 05/22/2022 1312   Recent Labs    03/27/22 0957 05/22/22 1312  HGB 8.9* 12.6*    CMP     Component Value Date/Time   NA 138 05/22/2022 1312   K 4.0 05/22/2022 1312   CL 102 05/22/2022 1312   CO2 29 05/22/2022 1312   GLUCOSE 113 (H) 05/22/2022 1312   BUN 14 05/22/2022 1312   CREATININE 1.03 05/22/2022 1312   CALCIUM 9.6 05/22/2022 1312   PROT 8.1  05/22/2022 1312   ALBUMIN 4.6 05/22/2022 1312   AST 24 05/22/2022 1312   ALT 28 05/22/2022 1312   ALKPHOS 69 05/22/2022 1312   BILITOT 0.3 05/22/2022 1312   GFRNONAA >60 05/22/2022 1312   GFRAA >60 07/03/2016 2224      Latest Ref Rng & Units 05/22/2022    1:12 PM 03/27/2022    9:57 AM  Hepatic Function  Total Protein 6.5 - 8.1 g/dL 8.1  7.5   Albumin 3.5 - 5.0 g/dL 4.6  4.1   AST 15 - 41 U/L 24  25   ALT 0 - 44 U/L 28  37   Alk Phosphatase 38 - 126 U/L 69  82   Total Bilirubin 0.3 - 1.2 mg/dL 0.3  0.3       Current Medications:    Current Outpatient Medications (Cardiovascular):    amLODipine (NORVASC) 5 MG tablet, Take 1 tablet (5 mg total) by mouth daily.   Current Outpatient Medications (Analgesics):    acetaminophen (TYLENOL) 500 MG tablet, Take 500 mg by mouth every 6 (six) hours as needed. joint   aspirin 81 MG chewable tablet, Chew by mouth daily.   Current Outpatient Medications (Other):    cholecalciferol (VITAMIN D) 1000 UNITS tablet, Take 1,000 Units by mouth daily.   DULoxetine (CYMBALTA) 20 MG capsule, Take 20 mg by mouth daily.   famotidine (PEPCID) 20 MG tablet, Take 1 tablet (20 mg total) by mouth daily.   gabapentin (NEURONTIN) 300 MG capsule, Take 300 mg by mouth 3 (three) times daily.   OMEPRAZOLE PO, Take by mouth daily.  Medical History:  Past Medical History:  Diagnosis Date   Chronic GERD    Hypertension    Pain    Pre-diabetes    Allergies:  Allergies  Allergen Reactions   Lisinopril Swelling   Shellfish Allergy      Surgical History:  He  has a past surgical history that includes Colonoscopy and Esophagogastroduodenoscopy. Family History:  His family history includes Other in his father; Stroke in his mother.  REVIEW OF SYSTEMS  : All other systems reviewed and negative except where noted in the History of Present Illness.  PHYSICAL EXAM: BP 120/60   Pulse 77   Ht 6\' 1"  (1.854 m)   Wt 232 lb (105.2 kg)   BMI 30.61 kg/m   General Appearance: Well nourished, in no apparent distress. Head:   Normocephalic and atraumatic. Eyes:  sclerae anicteric,conjunctive pink  Respiratory: Respiratory effort normal, BS equal bilaterally without rales, rhonchi, wheezing. Cardio: RRR with no MRGs. Peripheral pulses intact.  Abdomen: Soft,  Obese ,active bowel sounds. mild tenderness in the epigastrium. Without guarding and Without rebound. No masses. Rectal: Not evaluated Musculoskeletal: Full ROM, Normal gait. Without edema. Skin:  Dry and intact without significant lesions or rashes Neuro: Alert and  oriented x4;  No focal deficits. Psych:  Cooperative. Normal mood and affect.    Doree Albee, PA-C 9:09 AM

## 2022-06-19 ENCOUNTER — Ambulatory Visit: Payer: Medicare Other | Admitting: Physician Assistant

## 2022-06-19 ENCOUNTER — Encounter: Payer: Self-pay | Admitting: Physician Assistant

## 2022-06-19 VITALS — BP 120/60 | HR 77 | Ht 73.0 in | Wt 232.0 lb

## 2022-06-19 DIAGNOSIS — Z789 Other specified health status: Secondary | ICD-10-CM | POA: Diagnosis not present

## 2022-06-19 DIAGNOSIS — K219 Gastro-esophageal reflux disease without esophagitis: Secondary | ICD-10-CM

## 2022-06-19 DIAGNOSIS — D500 Iron deficiency anemia secondary to blood loss (chronic): Secondary | ICD-10-CM | POA: Diagnosis not present

## 2022-06-19 NOTE — Progress Notes (Signed)
Agree with the assessment and plan as outlined by Amanda Collier,  PA-C.  Dillyn Joaquin E. Laporscha Linehan, MD  

## 2022-06-19 NOTE — Patient Instructions (Addendum)
Please take your proton pump inhibitor medication, omeprazole 40 mg daily  Please take this medication 30 minutes to 1 hour before meals- this makes it more effective.  Avoid spicy and acidic foods Avoid fatty foods Limit your intake of coffee, tea, alcohol, and carbonated drinks Work to maintain a healthy weight Keep the head of the bed elevated at least 3 inches with blocks or a wedge pillow if you are having any nighttime symptoms Stay upright for 2 hours after eating Avoid meals and snacks three to four hours before bedtime Stop smoking   You have been scheduled for an endoscopy and colonoscopy. Please follow the written instructions given to you at your visit today. Please pick up your prep supplies at the pharmacy within the next 1-3 days. If you use inhalers (even only as needed), please bring them with you on the day of your procedure.   Due to recent changes in healthcare laws, you may see the results of your imaging and laboratory studies on MyChart before your provider has had a chance to review them.  We understand that in some cases there may be results that are confusing or concerning to you. Not all laboratory results come back in the same time frame and the provider may be waiting for multiple results in order to interpret others.  Please give [place] 48 hours in order for your provider to thoroughly review all the results before contacting the office for clarification of your results.    I appreciate the  opportunity to care for you  Thank You   [facility]

## 2022-06-25 ENCOUNTER — Other Ambulatory Visit: Payer: Non-veteran care

## 2022-07-13 ENCOUNTER — Telehealth: Payer: Self-pay | Admitting: *Deleted

## 2022-07-13 NOTE — Telephone Encounter (Signed)
Patient was [facility] to call the [facility] to get [facility] to have his procedures As it looks now he has not done that. Colon/Endo is with Dr Tomasa Rand on 6/4

## 2022-07-14 ENCOUNTER — Encounter: Payer: Self-pay | Admitting: Gastroenterology

## 2022-07-22 NOTE — Telephone Encounter (Signed)
Thank You.

## 2022-07-22 NOTE — Telephone Encounter (Signed)
Patients procedure is June 4th he has not contacted the office with [facility] approval    Maxwell Crofts, Do we need to have this before he can proceed with his procedure? He has a Social research officer, government  He has not returned my call

## 2022-07-25 ENCOUNTER — Inpatient Hospital Stay: Admission: RE | Admit: 2022-07-25 | Payer: Non-veteran care | Source: Ambulatory Visit

## 2022-07-28 ENCOUNTER — Ambulatory Visit (AMBULATORY_SURGERY_CENTER): Payer: No Typology Code available for payment source | Admitting: Gastroenterology

## 2022-07-28 ENCOUNTER — Other Ambulatory Visit (INDEPENDENT_AMBULATORY_CARE_PROVIDER_SITE_OTHER): Payer: No Typology Code available for payment source

## 2022-07-28 ENCOUNTER — Encounter: Payer: Self-pay | Admitting: Gastroenterology

## 2022-07-28 VITALS — BP 154/92 | HR 76 | Temp 98.4°F | Resp 15 | Ht 73.0 in | Wt 232.0 lb

## 2022-07-28 DIAGNOSIS — D500 Iron deficiency anemia secondary to blood loss (chronic): Secondary | ICD-10-CM | POA: Diagnosis not present

## 2022-07-28 DIAGNOSIS — K219 Gastro-esophageal reflux disease without esophagitis: Secondary | ICD-10-CM

## 2022-07-28 DIAGNOSIS — D125 Benign neoplasm of sigmoid colon: Secondary | ICD-10-CM

## 2022-07-28 DIAGNOSIS — D509 Iron deficiency anemia, unspecified: Secondary | ICD-10-CM

## 2022-07-28 LAB — CBC
HCT: 37.6 % — ABNORMAL LOW (ref 39.0–52.0)
Hemoglobin: 12.5 g/dL — ABNORMAL LOW (ref 13.0–17.0)
MCHC: 33.4 g/dL (ref 30.0–36.0)
MCV: 86.4 fl (ref 78.0–100.0)
Platelets: 243 10*3/uL (ref 150.0–400.0)
RBC: 4.35 Mil/uL (ref 4.22–5.81)
RDW: 16.7 % — ABNORMAL HIGH (ref 11.5–15.5)
WBC: 4.8 10*3/uL (ref 4.0–10.5)

## 2022-07-28 MED ORDER — SODIUM CHLORIDE 0.9 % IV SOLN
500.0000 mL | Freq: Once | INTRAVENOUS | Status: DC
Start: 2022-07-28 — End: 2022-07-28

## 2022-07-28 NOTE — Progress Notes (Signed)
Report to PACU, RN, vss, BBS= Clear.  

## 2022-07-28 NOTE — Patient Instructions (Signed)
Handouts provided on polyps and diverticulosis.   Resume previous diet.  Continue present medications.  Await pathology results.  Repeat colonoscopy (date not yet determined) for surveillance based on pathology results.  No obvious findings to explain iron deficiency anemia.   YOU HAD AN ENDOSCOPIC PROCEDURE TODAY AT THE [facility]:   Refer to the procedure report that was given to you for any specific questions about what was found during the examination.  If the procedure report does not answer your questions, please call your gastroenterologist to clarify.  If you requested that your care partner not be given the details of your procedure findings, then the procedure report has been included in a sealed envelope for you to review at your convenience later.  YOU SHOULD EXPECT: Some feelings of bloating in the abdomen. Passage of more gas than usual.  Walking can help get rid of the air that was put into your GI tract during the procedure and reduce the bloating. If you had a lower endoscopy (such as a colonoscopy or flexible sigmoidoscopy) you may notice spotting of blood in your stool or on the toilet paper. If you underwent a bowel prep for your procedure, you may not have a normal bowel movement for a few days.  Please Note:  You might notice some irritation and congestion in your nose or some drainage.  This is from the oxygen used during your procedure.  There is no need for concern and it should clear up in a day or so.  SYMPTOMS TO REPORT IMMEDIATELY:  Following lower endoscopy (colonoscopy or flexible sigmoidoscopy):  Excessive amounts of blood in the stool  Significant tenderness or worsening of abdominal pains  Swelling of the abdomen that is new, acute  Fever of 100F or higher  Following upper endoscopy (EGD)  Vomiting of blood or coffee ground material  New chest pain or pain under the shoulder blades  Painful or persistently difficult swallowing  New shortness  of breath  Fever of 100F or higher  Black, tarry-looking stools  For urgent or emergent issues, a gastroenterologist can be reached at any hour by calling [phone]. Do not use MyChart messaging for urgent concerns.    DIET:  We do recommend a small meal at first, but then you may proceed to your regular diet.  Drink plenty of fluids but you should avoid alcoholic beverages for 24 hours.  ACTIVITY:  You should plan to take it easy for the rest of today and you should NOT DRIVE or use heavy machinery until tomorrow (because of the sedation medicines used during the test).    FOLLOW UP: Our staff will call the number listed on your records the next business day following your procedure.  We will call around 7:15- 8:00 am to check on you and address any questions or concerns that you may have regarding the information given to you following your procedure. If we do not reach you, we will leave a message.     If any biopsies were taken you will be contacted by phone or by letter within the next 1-3 weeks.  Please call us at [phone] if you have not heard about the biopsies in 3 weeks.    SIGNATURES/CONFIDENTIALITY: You and/or your care partner have signed paperwork which will be entered into your electronic medical record.  These signatures attest to the fact that that the information above on your After Visit Summary has been reviewed and is understood.  Full responsibility of  the confidentiality of this discharge information lies with you and/or your care-partner.

## 2022-07-28 NOTE — Op Note (Signed)
[facility] Patient Name: Maxwell Harris Procedure Date: 07/28/2022 7:36 AM MRN: [id number] Endoscopist: Lorin Picket E. Tomasa Rand , MD, [id number] Age: 67 Referring MD:  Date of Birth: Jan 09, 1956 Gender: Male Account #: [id number] Procedure:                Upper GI endoscopy Indications:              Iron deficiency anemia Medicines:                Monitored Anesthesia Care Procedure:                Pre-Anesthesia Assessment:                           - Prior to the procedure, a History and Physical                            was performed, and patient medications and                            allergies were reviewed. The patient's tolerance of                            previous anesthesia was also reviewed. The risks                            and benefits of the procedure and the sedation                            options and risks were discussed with the patient.                            All questions were answered, and informed consent                            was obtained. Prior Anticoagulants: The patient has                            taken no anticoagulant or antiplatelet agents. ASA                            Grade Assessment: II - A patient with mild systemic                            disease. After reviewing the risks and benefits,                            the patient was deemed in satisfactory condition to                            undergo the procedure.                           After obtaining informed consent, the endoscope was  passed under direct vision. Throughout the                            procedure, the patient's blood pressure, pulse, and                            oxygen saturations were monitored continuously. The                            Olympus Scope [id number] was introduced through the                            mouth, and advanced to the second part of duodenum.                            The upper GI endoscopy  was accomplished without                            difficulty. The patient tolerated the procedure                            well. Scope In: Scope Out: Findings:                 The examined portions of the nasopharynx,                            oropharynx and larynx were normal.                           The examined esophagus was normal.                           The entire examined stomach was normal. Biopsies                            were taken with a cold forceps for Helicobacter                            pylori testing. Estimated blood loss was minimal.                           The examined duodenum was normal. Biopsies for                            histology were taken with a cold forceps for                            evaluation of celiac disease. Estimated blood loss                            was minimal. Complications:            No immediate complications. Estimated Blood Loss:     Estimated blood loss was minimal. Impression:               -  The examined portions of the nasopharynx,                            oropharynx and larynx were normal.                           - Normal esophagus.                           - Normal stomach. Biopsied.                           - Normal examined duodenum. Biopsied.                           - No endoscopic abnormalities to explain iron                            deficiency anemia. Recommendation:           - Patient has a contact number available for                            emergencies. The signs and symptoms of potential                            delayed complications were discussed with the                            patient. Return to normal activities tomorrow.                            Written discharge instructions were provided to the                            patient.                           - Resume previous diet.                           - Continue present medications.                           - Await  pathology results. Shelita Steptoe E. Tomasa Rand, MD 07/28/2022 8:39:49 AM This report has been signed electronically.

## 2022-07-28 NOTE — Progress Notes (Signed)
Pt's states no medical or surgical changes since previsit or office visit. 

## 2022-07-28 NOTE — Progress Notes (Signed)
Called to room to assist during endoscopic procedure.  Patient ID and intended procedure confirmed with present staff. Received instructions for my participation in the procedure from the performing physician.  

## 2022-07-28 NOTE — Op Note (Signed)
[facility] Patient Name: Maxwell Harris Procedure Date: 07/28/2022 7:36 AM MRN: [id number] Endoscopist: Lorin Picket E. Tomasa Rand , MD, [id number] Age: 67 Referring MD:  Date of Birth: 1956-01-18 Gender: Male Account #: [id number] Procedure:                Colonoscopy Indications:              Iron deficiency anemia Medicines:                Monitored Anesthesia Care Procedure:                Pre-Anesthesia Assessment:                           - Prior to the procedure, a History and Physical                            was performed, and patient medications and                            allergies were reviewed. The patient's tolerance of                            previous anesthesia was also reviewed. The risks                            and benefits of the procedure and the sedation                            options and risks were discussed with the patient.                            All questions were answered, and informed consent                            was obtained. Prior Anticoagulants: The patient has                            taken no anticoagulant or antiplatelet agents. ASA                            Grade Assessment: II - A patient with mild systemic                            disease. After reviewing the risks and benefits,                            the patient was deemed in satisfactory condition to                            undergo the procedure.                           After obtaining informed consent, the colonoscope  was passed under direct vision. Throughout the                            procedure, the patient's blood pressure, pulse, and                            oxygen saturations were monitored continuously. The                            Olympus [id number] SN [id number] was introduced through                            the anus and advanced to the the terminal ileum,                            with identification of the  appendiceal orifice and                            IC valve. The colonoscopy was performed without                            difficulty. The patient tolerated the procedure                            well. The quality of the bowel preparation was                            adequate. The terminal ileum, ileocecal valve,                            appendiceal orifice, and rectum were photographed.                            The bowel preparation used was Plenvu via split                            dose instruction. Scope In: 8:10:46 AM Scope Out: 8:31:55 AM Scope Withdrawal Time: 0 hours 15 minutes 32 seconds  Total Procedure Duration: 0 hours 21 minutes 9 seconds  Findings:                 The perianal and digital rectal examinations were                            normal. Pertinent negatives include normal                            sphincter tone and no palpable rectal lesions.                           An 8 mm polyp was found in the distal sigmoid                            colon. The polyp was pedunculated. The polyp was  removed with a cold snare. Resection and retrieval                            were complete. Estimated blood loss was minimal.                           A few medium-mouthed diverticula were found in the                            descending colon. There was no evidence of                            diverticular bleeding.                           The exam was otherwise normal throughout the                            examined colon.                           The terminal ileum appeared normal.                           Anal papilla(e) were hypertrophied.                           No additional abnormalities were found on                            retroflexion. Complications:            No immediate complications. Estimated Blood Loss:     Estimated blood loss was minimal. Impression:               - One 8 mm polyp in the distal sigmoid  colon,                            removed with a cold snare. Resected and retrieved.                           - Mild diverticulosis in the descending colon.                            There was no evidence of diverticular bleeding.                           - The examined portion of the ileum was normal.                           - Anal papilla(e) were hypertrophied. Recommendation:           - Patient has a contact number available for                            emergencies. The signs and symptoms of potential  delayed complications were discussed with the                            patient. Return to normal activities tomorrow.                            Written discharge instructions were provided to the                            patient.                           - Resume previous diet.                           - Continue present medications.                           - Await pathology results.                           - Repeat colonoscopy (date not yet determined) for                            surveillance based on pathology results.                           - No obvious findings to explain iron deficiency                            anemia. Jaydyn Bozzo E. Tomasa Rand, MD 07/28/2022 8:43:42 AM This report has been signed electronically.

## 2022-07-28 NOTE — Progress Notes (Signed)
[facility] History and Physical   Primary Care Physician:  Sherwood Gambler, MD   Reason for Procedure:   Iron deficiency anemia  Plan:    EGD, colonoscopy     HPI: Maxwell Harris is a 67 y.o. male undergoing EGD and colonoscopy to evaluate iron deficiency anemia.  He denies any chronic GI symptoms other than heartburn.  His hgb was 7.9 in January, but has increased to 12.6 in March.  He has no family history of colon cancer.  He had an EGD and colonoscopy in 2021 that were unremarkable except for reactive gastropathy.  Past Medical History:  Diagnosis Date   Chronic GERD    Hypertension    Pain    Pre-diabetes     Past Surgical History:  Procedure Laterality Date   COLONOSCOPY     ESOPHAGOGASTRODUODENOSCOPY      Prior to Admission medications   Medication Sig Start Date End Date Taking? Authorizing Provider  acetaminophen (TYLENOL) 500 MG tablet Take 500 mg by mouth every 6 (six) hours as needed. joint   Yes [provider]  amLODipine (NORVASC) 5 MG tablet Take 1 tablet (5 mg total) by mouth daily. 06/12/20  Yes Wieters, Hallie C, PA-C  aspirin 81 MG chewable tablet Chew by mouth daily.   Yes [provider]  cholecalciferol (VITAMIN D) 1000 UNITS tablet Take 1,000 Units by mouth daily.   Yes [provider]  DULoxetine (CYMBALTA) 20 MG capsule Take 20 mg by mouth daily.   Yes [provider]  famotidine (PEPCID) 20 MG tablet Take 1 tablet (20 mg total) by mouth daily. 09/12/19  Yes Yu, Amy V, PA-C  gabapentin (NEURONTIN) 300 MG capsule Take 300 mg by mouth 3 (three) times daily.   Yes [provider]  OMEPRAZOLE PO Take by mouth daily.   Yes [provider]    Current Outpatient Medications  Medication Sig Dispense Refill   acetaminophen (TYLENOL) 500 MG tablet Take 500 mg by mouth every 6 (six) hours as needed. joint     amLODipine (NORVASC) 5 MG tablet Take 1 tablet (5 mg total) by mouth daily. 30  tablet 0   aspirin 81 MG chewable tablet Chew by mouth daily.     cholecalciferol (VITAMIN D) 1000 UNITS tablet Take 1,000 Units by mouth daily.     DULoxetine (CYMBALTA) 20 MG capsule Take 20 mg by mouth daily.     famotidine (PEPCID) 20 MG tablet Take 1 tablet (20 mg total) by mouth daily. 14 tablet 0   gabapentin (NEURONTIN) 300 MG capsule Take 300 mg by mouth 3 (three) times daily.     OMEPRAZOLE PO Take by mouth daily.     Current Facility-Administered Medications  Medication Dose Route Frequency Provider Last Rate Last Admin   0.9 %  sodium chloride infusion  500 mL Intravenous Once Jenel Lucks, MD        Allergies as of 07/28/2022 - Review Complete 07/28/2022  Allergen Reaction Noted   Lisinopril Swelling 06/12/2020   Shellfish allergy  07/24/2011    Family History  Problem Relation Age of Onset   Stroke Mother    Other Father        patient was a kid when his Dad died, not sure of the cause   Colon cancer Neg Hx    Esophageal cancer Neg Hx     Social History   Socioeconomic History   Marital status: Single    Spouse name: Not on file  Number of children: 1   Years of education: Not on file   Highest education level: Not on file  Occupational History   Occupation: retired  Tobacco Use   Smoking status: Former    Years: 5    Types: Cigarettes   Smokeless tobacco: Never   Tobacco comments:    Not able to remember when he quit  Vaping Use   Vaping Use: Never used  Substance and Sexual Activity   Alcohol use: Yes    Alcohol/week: 1.0 standard drink of alcohol    Types: 1 Cans of beer per week    Comment: social   Drug use: Yes    Frequency: 1.0 times per week    Types: Marijuana   Sexual activity: Not on file  Other Topics Concern   Not on file  Social History Narrative   Not on file   Social Determinants of Health   Financial Resource Strain: Not on file  Food Insecurity: Not on file  Transportation Needs: Not on file  Physical Activity:  Not on file  Stress: Not on file  Social Connections: Not on file  Intimate Partner Violence: Not on file    Review of Systems:  All other review of systems negative except as mentioned in the HPI.  Physical Exam: Vital signs BP 130/69   Pulse 70   Temp 98.4 F (36.9 C)   Ht 6\' 1"  (1.854 m)   Wt 232 lb (105.2 kg)   SpO2 97%   BMI 30.61 kg/m   General:   Alert,  Well-developed, well-nourished, pleasant and cooperative in NAD Airway:  Mallampati 3 Lungs:  Clear throughout to auscultation.   Heart:  Regular rate and rhythm; no murmurs, clicks, rubs,  or gallops. Abdomen:  Soft, nontender and nondistended. Normal bowel sounds.   Neuro/Psych:  Normal mood and affect. A and O x 3   Jafari Mckillop E. Tomasa Rand, MD [facility]

## 2022-07-29 ENCOUNTER — Other Ambulatory Visit: Payer: Self-pay | Admitting: *Deleted

## 2022-07-29 ENCOUNTER — Telehealth: Payer: Self-pay | Admitting: *Deleted

## 2022-07-29 DIAGNOSIS — D500 Iron deficiency anemia secondary to blood loss (chronic): Secondary | ICD-10-CM

## 2022-07-29 NOTE — Telephone Encounter (Signed)
  Follow up Call-     07/28/2022    7:12 AM  Call back number  Post procedure Call Back phone  # [phone]  Permission to leave phone message Yes     Patient questions: Message left to call us if necessary.

## 2022-07-29 NOTE — Progress Notes (Signed)
Maxwell Harris,  Your hemoglobin is stable from March.  Your MCV has improved which is an indicator of resolution of iron deficiency. I recommend we repeat a CBC and iron panel in 3 months.  If your anemia has worsened, we will likely proceed with pill camera study.

## 2022-08-05 ENCOUNTER — Encounter: Payer: Self-pay | Admitting: Gastroenterology

## 2022-08-16 ENCOUNTER — Ambulatory Visit
Admission: RE | Admit: 2022-08-16 | Discharge: 2022-08-16 | Disposition: A | Discharge status: Home / Self Care | Payer: Non-veteran care | Source: Ambulatory Visit | Attending: Nurse Practitioner | Admitting: Nurse Practitioner

## 2022-08-16 DIAGNOSIS — M5416 Radiculopathy, lumbar region: Secondary | ICD-10-CM

## 2022-08-25 ENCOUNTER — Inpatient Hospital Stay: Payer: Medicare Other | Attending: Physician Assistant

## 2022-08-25 ENCOUNTER — Other Ambulatory Visit: Payer: Self-pay

## 2022-08-25 DIAGNOSIS — D509 Iron deficiency anemia, unspecified: Secondary | ICD-10-CM | POA: Insufficient documentation

## 2022-08-25 LAB — CBC WITH DIFFERENTIAL (CANCER CENTER ONLY)
Abs Immature Granulocytes: 0.04 10*3/uL (ref 0.00–0.07)
Basophils Absolute: 0 10*3/uL (ref 0.0–0.1)
Basophils Relative: 1 %
Eosinophils Absolute: 0.2 10*3/uL (ref 0.0–0.5)
Eosinophils Relative: 3 %
HCT: 36.2 % — ABNORMAL LOW (ref 39.0–52.0)
Hemoglobin: 12.5 g/dL — ABNORMAL LOW (ref 13.0–17.0)
Immature Granulocytes: 1 %
Lymphocytes Relative: 16 %
Lymphs Abs: 1 10*3/uL (ref 0.7–4.0)
MCH: 29.5 pg (ref 26.0–34.0)
MCHC: 34.5 g/dL (ref 30.0–36.0)
MCV: 85.4 fL (ref 80.0–100.0)
Monocytes Absolute: 0.9 10*3/uL (ref 0.1–1.0)
Monocytes Relative: 15 %
Neutro Abs: 4 10*3/uL (ref 1.7–7.7)
Neutrophils Relative %: 64 %
Platelet Count: 241 10*3/uL (ref 150–400)
RBC: 4.24 MIL/uL (ref 4.22–5.81)
RDW: 14.6 % (ref 11.5–15.5)
WBC Count: 6.1 10*3/uL (ref 4.0–10.5)
nRBC: 0 % (ref 0.0–0.2)

## 2022-08-25 LAB — CMP (CANCER CENTER ONLY)
ALT: 30 U/L (ref 0–44)
AST: 27 U/L (ref 15–41)
Albumin: 4.2 g/dL (ref 3.5–5.0)
Alkaline Phosphatase: 61 U/L (ref 38–126)
Anion gap: 9 (ref 5–15)
BUN: 12 mg/dL (ref 8–23)
CO2: 24 mmol/L (ref 22–32)
Calcium: 9.6 mg/dL (ref 8.9–10.3)
Chloride: 105 mmol/L (ref 98–111)
Creatinine: 0.96 mg/dL (ref 0.61–1.24)
GFR, Estimated: >60 mL/min (ref >60)
Glucose, Bld: 109 mg/dL — ABNORMAL HIGH (ref 70–99)
Potassium: 3.9 mmol/L (ref 3.5–5.1)
Sodium: 138 mmol/L (ref 135–145)
Total Bilirubin: 0.5 mg/dL (ref 0.3–1.2)
Total Protein: 7.1 g/dL (ref 6.5–8.1)

## 2022-08-25 LAB — IRON AND IRON BINDING CAPACITY (CC-WL,HP ONLY)
Iron: 126 ug/dL (ref 45–182)
Saturation Ratios: 28 % (ref 17.9–39.5)
TIBC: 458 ug/dL — ABNORMAL HIGH (ref 250–450)
UIBC: 332 ug/dL (ref 117–376)

## 2022-08-25 LAB — FERRITIN: Ferritin: 130 ng/mL (ref 24–336)

## 2022-08-31 ENCOUNTER — Telehealth: Payer: Self-pay

## 2022-08-31 NOTE — Telephone Encounter (Signed)
-----   Message from Briant Cedar, PA-C sent at 08/30/2022  3:42 PM EDT ----- Please notify patient that iron levels have improved. No need for additional  IV iron at this time.  ----- Message ----- From: Leory Plowman, [facility] [place] Sent: 08/25/2022   1:53 PM EDT To: Briant Cedar, PA-C

## 2022-08-31 NOTE — Telephone Encounter (Signed)
Pt advised with VU 

## 2022-10-20 ENCOUNTER — Inpatient Hospital Stay (HOSPITAL_COMMUNITY)
Admission: EM | Admit: 2022-10-20 | Discharge: 2022-10-23 | DRG: 286 | Disposition: A | Discharge status: Home / Self Care | Payer: No Typology Code available for payment source | Attending: Internal Medicine | Admitting: Internal Medicine

## 2022-10-20 ENCOUNTER — Other Ambulatory Visit: Payer: Self-pay

## 2022-10-20 ENCOUNTER — Ambulatory Visit
Admission: EM | Admit: 2022-10-20 | Discharge: 2022-10-20 | Disposition: A | Discharge status: Other Acute Inpatient Hospital | Payer: No Typology Code available for payment source | Attending: Physician Assistant | Admitting: Physician Assistant

## 2022-10-20 ENCOUNTER — Emergency Department (HOSPITAL_COMMUNITY): Payer: No Typology Code available for payment source

## 2022-10-20 ENCOUNTER — Encounter: Payer: Self-pay | Admitting: Physician Assistant

## 2022-10-20 DIAGNOSIS — J189 Pneumonia, unspecified organism: Secondary | ICD-10-CM

## 2022-10-20 DIAGNOSIS — F1010 Alcohol abuse, uncomplicated: Secondary | ICD-10-CM | POA: Diagnosis present

## 2022-10-20 DIAGNOSIS — D509 Iron deficiency anemia, unspecified: Secondary | ICD-10-CM | POA: Diagnosis present

## 2022-10-20 DIAGNOSIS — I10 Essential (primary) hypertension: Secondary | ICD-10-CM | POA: Insufficient documentation

## 2022-10-20 DIAGNOSIS — M879 Osteonecrosis, unspecified: Secondary | ICD-10-CM | POA: Diagnosis present

## 2022-10-20 DIAGNOSIS — R0603 Acute respiratory distress: Secondary | ICD-10-CM | POA: Diagnosis not present

## 2022-10-20 DIAGNOSIS — Z6831 Body mass index (BMI) 31.0-31.9, adult: Secondary | ICD-10-CM

## 2022-10-20 DIAGNOSIS — Z91013 Allergy to seafood: Secondary | ICD-10-CM

## 2022-10-20 DIAGNOSIS — I5021 Acute systolic (congestive) heart failure: Secondary | ICD-10-CM | POA: Diagnosis present

## 2022-10-20 DIAGNOSIS — R0602 Shortness of breath: Secondary | ICD-10-CM | POA: Diagnosis present

## 2022-10-20 DIAGNOSIS — E669 Obesity, unspecified: Secondary | ICD-10-CM | POA: Diagnosis present

## 2022-10-20 DIAGNOSIS — R0600 Dyspnea, unspecified: Secondary | ICD-10-CM | POA: Diagnosis present

## 2022-10-20 DIAGNOSIS — I509 Heart failure, unspecified: Secondary | ICD-10-CM

## 2022-10-20 DIAGNOSIS — Z7982 Long term (current) use of aspirin: Secondary | ICD-10-CM

## 2022-10-20 DIAGNOSIS — K219 Gastro-esophageal reflux disease without esophagitis: Secondary | ICD-10-CM | POA: Diagnosis present

## 2022-10-20 DIAGNOSIS — Z79899 Other long term (current) drug therapy: Secondary | ICD-10-CM

## 2022-10-20 DIAGNOSIS — I110 Hypertensive heart disease with heart failure: Secondary | ICD-10-CM | POA: Diagnosis not present

## 2022-10-20 DIAGNOSIS — G4733 Obstructive sleep apnea (adult) (pediatric): Secondary | ICD-10-CM | POA: Diagnosis present

## 2022-10-20 DIAGNOSIS — G8929 Other chronic pain: Secondary | ICD-10-CM | POA: Diagnosis present

## 2022-10-20 DIAGNOSIS — F1920 Other psychoactive substance dependence, uncomplicated: Secondary | ICD-10-CM | POA: Insufficient documentation

## 2022-10-20 DIAGNOSIS — Z1152 Encounter for screening for COVID-19: Secondary | ICD-10-CM

## 2022-10-20 DIAGNOSIS — R062 Wheezing: Secondary | ICD-10-CM | POA: Diagnosis present

## 2022-10-20 DIAGNOSIS — I447 Left bundle-branch block, unspecified: Secondary | ICD-10-CM | POA: Diagnosis present

## 2022-10-20 DIAGNOSIS — Z888 Allergy status to other drugs, medicaments and biological substances status: Secondary | ICD-10-CM

## 2022-10-20 DIAGNOSIS — E876 Hypokalemia: Secondary | ICD-10-CM | POA: Diagnosis present

## 2022-10-20 DIAGNOSIS — F1410 Cocaine abuse, uncomplicated: Secondary | ICD-10-CM | POA: Diagnosis present

## 2022-10-20 DIAGNOSIS — Z87891 Personal history of nicotine dependence: Secondary | ICD-10-CM

## 2022-10-20 DIAGNOSIS — E1140 Type 2 diabetes mellitus with diabetic neuropathy, unspecified: Secondary | ICD-10-CM | POA: Diagnosis present

## 2022-10-20 DIAGNOSIS — Z7984 Long term (current) use of oral hypoglycemic drugs: Secondary | ICD-10-CM

## 2022-10-20 DIAGNOSIS — I2510 Atherosclerotic heart disease of native coronary artery without angina pectoris: Secondary | ICD-10-CM | POA: Diagnosis present

## 2022-10-20 DIAGNOSIS — Z634 Disappearance and death of family member: Secondary | ICD-10-CM

## 2022-10-20 DIAGNOSIS — I2489 Other forms of acute ischemic heart disease: Secondary | ICD-10-CM | POA: Diagnosis present

## 2022-10-20 LAB — DIFFERENTIAL
Abs Immature Granulocytes: 0.03 10*3/uL (ref 0.00–0.07)
Basophils Absolute: 0.1 10*3/uL (ref 0.0–0.1)
Basophils Relative: 1 %
Eosinophils Absolute: 0.2 10*3/uL (ref 0.0–0.5)
Eosinophils Relative: 3 %
Immature Granulocytes: 0 %
Lymphocytes Relative: 10 %
Lymphs Abs: 0.8 10*3/uL (ref 0.7–4.0)
Monocytes Absolute: 0.9 10*3/uL (ref 0.1–1.0)
Monocytes Relative: 11 %
Neutro Abs: 6.1 10*3/uL (ref 1.7–7.7)
Neutrophils Relative %: 75 %

## 2022-10-20 LAB — COMPREHENSIVE METABOLIC PANEL
ALT: 69 U/L — ABNORMAL HIGH (ref 0–44)
AST: 55 U/L — ABNORMAL HIGH (ref 15–41)
Albumin: 3.7 g/dL (ref 3.5–5.0)
Alkaline Phosphatase: 75 U/L (ref 38–126)
Anion gap: 12 (ref 5–15)
BUN: 14 mg/dL (ref 8–23)
CO2: 22 mmol/L (ref 22–32)
Calcium: 9.3 mg/dL (ref 8.9–10.3)
Chloride: 104 mmol/L (ref 98–111)
Creatinine, Ser: 1.18 mg/dL (ref 0.61–1.24)
GFR, Estimated: >60 mL/min (ref >60)
Glucose, Bld: 151 mg/dL — ABNORMAL HIGH (ref 70–99)
Potassium: 3.3 mmol/L — ABNORMAL LOW (ref 3.5–5.1)
Sodium: 138 mmol/L (ref 135–145)
Total Bilirubin: 0.3 mg/dL (ref 0.3–1.2)
Total Protein: 7.2 g/dL (ref 6.5–8.1)

## 2022-10-20 LAB — I-STAT CHEM 8, ED
BUN: 15 mg/dL (ref 8–23)
Calcium, Ion: 1.18 mmol/L (ref 1.15–1.40)
Chloride: 104 mmol/L (ref 98–111)
Creatinine, Ser: 1.1 mg/dL (ref 0.61–1.24)
Glucose, Bld: 149 mg/dL — ABNORMAL HIGH (ref 70–99)
HCT: 38 % — ABNORMAL LOW (ref 39.0–52.0)
Hemoglobin: 12.9 g/dL — ABNORMAL LOW (ref 13.0–17.0)
Potassium: 3.4 mmol/L — ABNORMAL LOW (ref 3.5–5.1)
Sodium: 140 mmol/L (ref 135–145)
TCO2: 22 mmol/L (ref 22–32)

## 2022-10-20 LAB — PROTIME-INR
INR: 1.2 (ref 0.8–1.2)
Prothrombin Time: 14.9 seconds (ref 11.4–15.2)

## 2022-10-20 LAB — CBC
HCT: 36.4 % — ABNORMAL LOW (ref 39.0–52.0)
Hemoglobin: 12.2 g/dL — ABNORMAL LOW (ref 13.0–17.0)
MCH: 29.3 pg (ref 26.0–34.0)
MCHC: 33.5 g/dL (ref 30.0–36.0)
MCV: 87.5 fL (ref 80.0–100.0)
Platelets: 243 10*3/uL (ref 150–400)
RBC: 4.16 MIL/uL — ABNORMAL LOW (ref 4.22–5.81)
RDW: 13.3 % (ref 11.5–15.5)
WBC: 8.1 10*3/uL (ref 4.0–10.5)
nRBC: 0 % (ref 0.0–0.2)

## 2022-10-20 LAB — CBG MONITORING, ED: Glucose-Capillary: 120 mg/dL — ABNORMAL HIGH (ref 70–99)

## 2022-10-20 LAB — RESPIRATORY PANEL BY PCR

## 2022-10-20 LAB — SARS CORONAVIRUS 2 BY RT PCR: SARS Coronavirus 2 by RT PCR: NEGATIVE

## 2022-10-20 LAB — BRAIN NATRIURETIC PEPTIDE: B Natriuretic Peptide: 366 pg/mL — ABNORMAL HIGH (ref 0.0–100.0)

## 2022-10-20 LAB — ETHANOL: Alcohol, Ethyl (B): <10 mg/dL (ref <10)

## 2022-10-20 LAB — TROPONIN I (HIGH SENSITIVITY)
Troponin I (High Sensitivity): 19 ng/L — ABNORMAL HIGH (ref <18)
Troponin I (High Sensitivity): 23 ng/L — ABNORMAL HIGH (ref <18)

## 2022-10-20 LAB — APTT: aPTT: 30 seconds (ref 24–36)

## 2022-10-20 MED ORDER — ENOXAPARIN SODIUM 60 MG/0.6ML IJ SOSY
50.0000 mg | PREFILLED_SYRINGE | Freq: Every day | INTRAMUSCULAR | Status: DC
  Administered 2022-10-20 – 2022-10-22 (×3): 50 mg via SUBCUTANEOUS
  Filled 2022-10-20 (×3): qty 0.6

## 2022-10-20 MED ORDER — ASPIRIN 81 MG PO CHEW
81.0000 mg | CHEWABLE_TABLET | Freq: Every day | ORAL | Status: DC
  Administered 2022-10-20 – 2022-10-23 (×4): 81 mg via ORAL
  Filled 2022-10-20 (×4): qty 1

## 2022-10-20 MED ORDER — HYDRALAZINE HCL 20 MG/ML IJ SOLN: 5.0000 mg | Freq: Four times a day (QID) | INTRAMUSCULAR | Status: DC | PRN

## 2022-10-20 MED ORDER — GABAPENTIN 300 MG PO CAPS
300.0000 mg | ORAL_CAPSULE | Freq: Three times a day (TID) | ORAL | Status: DC
  Administered 2022-10-20 – 2022-10-23 (×7): 300 mg via ORAL
  Filled 2022-10-20 (×7): qty 1

## 2022-10-20 MED ORDER — ACETAMINOPHEN 325 MG PO TABS
650.0000 mg | ORAL_TABLET | ORAL | Status: DC | PRN
  Administered 2022-10-21: 650 mg via ORAL
  Filled 2022-10-20 (×2): qty 2

## 2022-10-20 MED ORDER — ENOXAPARIN SODIUM 40 MG/0.4ML IJ SOSY: 40.0000 mg | PREFILLED_SYRINGE | INTRAMUSCULAR | Status: DC

## 2022-10-20 MED ORDER — POTASSIUM CHLORIDE CRYS ER 20 MEQ PO TBCR
40.0000 meq | EXTENDED_RELEASE_TABLET | Freq: Once | ORAL | Status: AC
  Administered 2022-10-20: 40 meq via ORAL
  Filled 2022-10-20: qty 2

## 2022-10-20 MED ORDER — DULOXETINE HCL 20 MG PO CPEP
20.0000 mg | ORAL_CAPSULE | Freq: Every day | ORAL | Status: DC
  Administered 2022-10-20 – 2022-10-23 (×4): 20 mg via ORAL
  Filled 2022-10-20 (×4): qty 1

## 2022-10-20 MED ORDER — AZITHROMYCIN 250 MG PO TABS
500.0000 mg | ORAL_TABLET | Freq: Every day | ORAL | Status: DC
  Administered 2022-10-20 – 2022-10-23 (×4): 500 mg via ORAL
  Filled 2022-10-20 (×5): qty 2

## 2022-10-20 MED ORDER — ONDANSETRON HCL 4 MG/2ML IJ SOLN: 4.0000 mg | Freq: Four times a day (QID) | INTRAMUSCULAR | Status: DC | PRN

## 2022-10-20 MED ORDER — FUROSEMIDE 10 MG/ML IJ SOLN
40.0000 mg | Freq: Once | INTRAMUSCULAR | Status: AC
  Administered 2022-10-20: 40 mg via INTRAVENOUS
  Filled 2022-10-20: qty 4

## 2022-10-20 MED ORDER — SODIUM CHLORIDE 0.9 % IV SOLN
2.0000 g | INTRAVENOUS | Status: DC
  Administered 2022-10-20 – 2022-10-22 (×3): 2 g via INTRAVENOUS
  Filled 2022-10-20 (×5): qty 20

## 2022-10-20 MED ORDER — AMLODIPINE BESYLATE 10 MG PO TABS
10.0000 mg | ORAL_TABLET | Freq: Every day | ORAL | Status: DC
  Administered 2022-10-20 – 2022-10-21 (×2): 10 mg via ORAL
  Filled 2022-10-20 (×2): qty 1

## 2022-10-20 MED ORDER — FUROSEMIDE 10 MG/ML IJ SOLN
40.0000 mg | Freq: Every day | INTRAMUSCULAR | Status: DC
  Administered 2022-10-20 – 2022-10-21 (×2): 40 mg via INTRAVENOUS
  Filled 2022-10-20 (×2): qty 4

## 2022-10-20 MED ORDER — PANTOPRAZOLE SODIUM 40 MG PO TBEC
40.0000 mg | DELAYED_RELEASE_TABLET | Freq: Every day | ORAL | Status: DC
  Administered 2022-10-20 – 2022-10-23 (×4): 40 mg via ORAL
  Filled 2022-10-20 (×4): qty 1

## 2022-10-20 MED ORDER — SODIUM CHLORIDE 0.9% FLUSH
3.0000 mL | INTRAVENOUS | Status: DC | PRN
  Administered 2022-10-20: 3 mL via INTRAVENOUS

## 2022-10-20 MED ORDER — IOHEXOL 350 MG/ML SOLN
75.0000 mL | Freq: Once | INTRAVENOUS | Status: AC | PRN
  Administered 2022-10-20: 75 mL via INTRAVENOUS

## 2022-10-20 MED ORDER — SODIUM CHLORIDE 0.9 % IV SOLN: 250.0000 mL | INTRAVENOUS | Status: DC | PRN

## 2022-10-20 MED ORDER — SODIUM CHLORIDE 0.9% FLUSH
3.0000 mL | Freq: Two times a day (BID) | INTRAVENOUS | Status: DC
  Administered 2022-10-20 – 2022-10-23 (×5): 3 mL via INTRAVENOUS

## 2022-10-20 NOTE — Plan of Care (Signed)

## 2022-10-20 NOTE — ED Notes (Signed)
Patient transported to X-ray 

## 2022-10-20 NOTE — H&P (Signed)
[facility] H&P   Patient Demographics:    Maxwell Harris, is a 67 y.o. male  MRN: [id number]   DOB - Nov 23, 1955  Admit Date - 10/20/2022  Outpatient Primary MD for the patient is Borum, Vista Mink, MD  Referring MD/NP/PA: Vonna Kotyk    Patient coming from: home  Chief Complaint  Patient presents with   Shortness of Breath   Dizziness      HPI:    Maxwell Harris  is a 68 y.o. male, with past medical history for hypertension, GERD, prediabetes, neuropathic pain, presents to ED secondary to complaints of shortness of breath, patient reports dyspnea, wheezing, orthopnea at nighttime, he denies any history of tobacco use or abuse, but reports smoking THC, and snorting cocaine occasionally) most recent 48 hours ago), asthma, COPD, and history of coronary artery disease, denies any chest pain, reports symptoms has been progressive over the last 3 days, no nausea, no vomiting, no fever, no chills, no sick contact -NAD workup significant for potassium of 3.4, EKG significant for old left bundle branch block, CTA chest negative for PE, but significant for bilateral bronchial wall thickening, with scattered groundglass airspace disease and trace bilateral effusion, consistent with multifocal bronchopneumonia bronchitis, cardiomegaly with prominent left ventricle dilation, ENP elevated at 366, patient received IV Lasix and [facility] hospitalist consulted to admit.    Review of systems:      A full 10 point Review of Systems was done, except as stated above, all other Review of Systems were negative.   With Past History of the following :    Past Medical History:  Diagnosis Date   Chronic GERD    Hypertension    Pain    Pre-diabetes       Past Surgical History:  Procedure Laterality Date   COLONOSCOPY     ESOPHAGOGASTRODUODENOSCOPY        Social History:     Social History    Tobacco Use   Smoking status: Former    Types: Cigarettes   Smokeless tobacco: Never   Tobacco comments:    Not able to remember when he quit  Substance Use Topics   Alcohol use: Yes    Alcohol/week: 1.0 standard drink of alcohol    Types: 1 Cans of beer per week    Comment: social        Family History :     Family History  Problem Relation Age of Onset   Stroke Mother    Other Father        patient was a kid when his Dad died, not sure of the cause   Colon cancer Neg Hx    Esophageal cancer Neg Hx     Home Medications:   Prior to Admission medications   Medication Sig Start Date End Date Taking? Authorizing Provider  acetaminophen (TYLENOL) 500 MG tablet Take 500 mg by mouth every 6 (  six) hours as needed. joint    [provider]  amLODipine (NORVASC) 5 MG tablet Take 1 tablet (5 mg total) by mouth daily. Patient taking differently: Take 10 mg by mouth daily. 06/12/20   Wieters, Hallie C, PA-C  aspirin 81 MG chewable tablet Chew by mouth daily.    [provider]  cholecalciferol (VITAMIN D) 1000 UNITS tablet Take 1,000 Units by mouth daily.    [provider]  DULoxetine (CYMBALTA) 20 MG capsule Take 20 mg by mouth daily.    [provider]  famotidine (PEPCID) 20 MG tablet Take 1 tablet (20 mg total) by mouth daily. 09/12/19   Cathie Hoops, Amy V, PA-C  gabapentin (NEURONTIN) 300 MG capsule Take 300 mg by mouth 3 (three) times daily.    [provider]  OMEPRAZOLE PO Take by mouth daily.    [provider]     Allergies:     Allergies  Allergen Reactions   Lisinopril Swelling   Shellfish Allergy      Physical Exam:   Vitals  Blood pressure 136/86, pulse 88, temperature 97.8 F (36.6 C), temperature source Oral, resp. rate 18, SpO2 95%.   1. General Developed male, with mild anxiety,  2. Normal affect and insight, Not Suicidal or Homicidal, Awake Alert, Oriented X 3.  3. No F.N deficits, ALL C.Nerves  Intact, Strength 5/5 all 4 extremities, Sensation intact all 4 extremities, Plantars down going.  4. Ears and Eyes appear Normal, Conjunctivae clear, PERRLA. Moist Oral Mucosa.  5. Supple Neck, deviated JVD, No cervical lymphadenopathy appriciated, No Carotid Bruits.  6. Symmetrical Chest wall movement, Good air movement bilaterally, crackles at the bases  7. RRR, No Gallops, Rubs or Murmurs, No Parasternal Heave.  Trace edema  8. Positive Bowel Sounds, Abdomen Soft, No tenderness, No organomegaly appriciated,No rebound -guarding or rigidity.  9.  No Cyanosis, Normal Skin Turgor, No Skin Rash or Bruise.  10. Good muscle tone,  joints appear normal , no effusions, Normal ROM.     CBC Recent Labs  Lab 10/20/22 1222 10/20/22 1231  WBC 8.1  --   HGB 12.2* 12.9*  HCT 36.4* 38.0*  PLT 243  --   MCV 87.5  --   MCH 29.3  --   MCHC 33.5  --   RDW 13.3  --   LYMPHSABS 0.8  --   MONOABS 0.9  --   EOSABS 0.2  --   BASOSABS 0.1  --    ------------------------------------------------------------------------------------------------------------------  Chemistries  Recent Labs  Lab 10/20/22 1222 10/20/22 1231  NA 138 140  K 3.3* 3.4*  CL 104 104  CO2 22  --   GLUCOSE 151* 149*  BUN 14 15  CREATININE 1.18 1.10  CALCIUM 9.3  --   AST 55*  --   ALT 69*  --   ALKPHOS 75  --   BILITOT 0.3  --    ------------------------------------------------------------------------------------------------------------------ estimated creatinine clearance is 82.4 mL/min (by C-G formula based on SCr of 1.1 mg/dL). ------------------------------------------------------------------------------------------------------------------ No results for input(s): "TSH", "T4TOTAL", "T3FREE", "THYROIDAB" in the last 72 hours.  Invalid input(s): "FREET3"  Coagulation profile Recent Labs  Lab 10/20/22 1222  INR 1.2    ------------------------------------------------------------------------------------------------------------------- No results for input(s): "DDIMER" in the last 72 hours. -------------------------------------------------------------------------------------------------------------------  Cardiac Enzymes No results for input(s): "CKMB", "TROPONINI", "MYOGLOBIN" in the last 168 hours.  Invalid input(s): "CK" ------------------------------------------------------------------------------------------------------------------    Component Value Date/Time   BNP 366.0 (H) 10/20/2022 1222     ---------------------------------------------------------------------------------------------------------------  Urinalysis    Component Value Date/Time   COLORURINE YELLOW 07/24/2011 0439   APPEARANCEUR CLEAR 07/24/2011 0439   LABSPEC 1.021 07/24/2011 0439   PHURINE 5.5 07/24/2011 0439   GLUCOSEU NEGATIVE 07/24/2011 0439   HGBUR LARGE (A) 07/24/2011 0439   BILIRUBINUR NEGATIVE 07/24/2011 0439   KETONESUR NEGATIVE 07/24/2011 0439   PROTEINUR 30 (A) 07/24/2011 0439   UROBILINOGEN 0.2 07/24/2011 0439   NITRITE NEGATIVE 07/24/2011 0439   LEUKOCYTESUR NEGATIVE 07/24/2011 0439    ----------------------------------------------------------------------------------------------------------------   Imaging Results:    CT Angio Chest PE W and/or Wo Contrast  Result Date: 10/20/2022 CLINICAL DATA:  Short of breath EXAM: CT ANGIOGRAPHY CHEST WITH CONTRAST TECHNIQUE: Multidetector CT imaging of the chest was performed using the standard protocol during bolus administration of intravenous contrast. Multiplanar CT image reconstructions and MIPs were obtained to evaluate the vascular anatomy. RADIATION DOSE REDUCTION: This exam was performed according to the departmental dose-optimization program which includes automated exposure control, adjustment of the mA and/or kV according to patient size and/or use of  iterative reconstruction technique. CONTRAST:  75mL OMNIPAQUE IOHEXOL 350 MG/ML SOLN COMPARISON:  10/20/2022 FINDINGS: Cardiovascular: This is a technically adequate evaluation of the pulmonary vasculature. No filling defects or pulmonary emboli. Mild cardiomegaly, with prominent left ventricular dilatation. No pericardial effusion. Normal caliber of the thoracic aorta. Atherosclerosis of the aorta and coronary vasculature. Mediastinum/Nodes: Borderline enlarged mediastinal and hilar lymph nodes, largest measuring 14 mm in the subcarinal region image 67/5, nonspecific. Thyroid, trachea, and esophagus are unremarkable. Lungs/Pleura: There are trace bilateral pleural effusions, right greater than left. Scattered ground-glass opacities are seen within the dependent upper lobes and bilateral lower lobes, which may reflect a combination of hypoventilatory change and underlying inflammatory/infectious etiology. There is bilateral bronchial wall thickening, most pronounced within the lower lobes. No pneumothorax. Central airways are patent. Upper Abdomen: Hepatic steatosis. No acute upper abdominal findings. Musculoskeletal: No acute displaced fracture. 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 Reconstructed images demonstrate no additional findings. Review of the MIP images confirms the above findings. IMPRESSION: 1. No evidence of pulmonary embolus. 2. Bilateral bronchial wall thickening, with scattered ground-glass airspace disease and trace bilateral pleural effusions, consistent with multifocal bronchopneumonia bronchitis. 3. Borderline mediastinal and hilar lymph nodes, consistent with reactive adenopathy. 4. Cardiomegaly, with prominent left ventricular dilatation. 5. Aortic Atherosclerosis (ICD10-I70.0). Coronary artery atherosclerosis. 6. Hepatic steatosis. Electronically Signed   By: Sharlet Salina M.D.   On: 10/20/2022 16:16   DG Chest  2 View  Result Date: 10/20/2022 CLINICAL DATA:  Shortness of breath EXAM: CHEST - 2 VIEW COMPARISON:  None Available. FINDINGS: No pneumothorax or effusion. Normal cardiopericardial silhouette. Overlapping cardiac leads. No edema. Degenerative changes along the spine. IMPRESSION: No acute cardiopulmonary disease. Electronically Signed   By: Karen Kays M.D.   On: 10/20/2022 14:43     EKG:  Vent. rate 91 BPM PR interval 158 ms QRS duration 158 ms QT/QTcB 416/511 ms P-R-T axes 74 63 48 Normal sinus rhythm Possible Left atrial enlargement Left bundle branch block(old) Abnormal ECG When compared with ECG of 20-Oct-2022 11:26,   Assessment & Plan:    Principal Problem:   Dyspnea Active Problems:   Essential hypertension   Acute CHF (HCC)   Polysubstance (excluding opioids) dependence (HCC)    Dyspnea is likely due to acute congestive heart failure (unspecified) Possible community-acquired pneumonia -Patient presents with dyspnea, CTA chest negative for PE, with concern for atypical infection,  versus volume overload. -Elevated BNP, elevated CVP and lower extremity edema -Admitted under CHF pathway, check 2D echo, monitor on telemetry, will start on Lasix 40 mg IV daily -EKG with left bundle branch block, appears to be old on previous EKG in 2018, will continue to cycle troponins -Possible Atypical infection, will check respiratory panel -Continue with IV Rocephin and azithromycin, check Legionella antigen, pneumonia antigen -Encouraged use incentive spirometry and flutter valve  Hypertension -continue home Norvasc, will add.  Hydralazine  Substance abuse -Patient endorse smoking THC and snorting cocaine -He was counseled  Obesity BMI of 31  Neuropathic pain -Continue with gabapentin  Hypokalemia -Repleted  GERD -Continue with PPI     DVT Prophylaxis  Lovenox  AM Labs Ordered, also please review Full Orders  Family Communication: Admission, patients condition  and plan of care including tests being ordered have been discussed with the patient  who indicate understanding and agree with the plan and Code Status.  Code Status full  Likely DC to  home  Condition GUARDED    Consults called: none    Admission status: observation    Time spent in minutes : 70 minutes   Huey Bienenstock M.D on 10/20/2022 at 5:12 PM   [facility] - Office  [phone]

## 2022-10-20 NOTE — ED Provider Notes (Signed)
[facility]    CSN: [id number] Arrival date & time: 10/20/22  1041      History   Chief Complaint Chief Complaint  Patient presents with   Nasal Congestion   Wheezing    HPI Zydarius Linkenhoker is a 67 y.o. male.   Patient here today for evaluation of wheezing and just overall not feeling well.  He states that he has had some shortness of breath over the last few days.  He also notes that he feels somewhat weak this morning.  He has not had any nausea or vomiting.  He denies any chest pain.  He has not had any sore throat or congestion.  The history is provided by the patient.  Wheezing Associated symptoms: shortness of breath   Associated symptoms: no chest pain, no cough, no ear pain, no fever and no sore throat     Past Medical History:  Diagnosis Date   Chronic GERD    Hypertension    Pain    Pre-diabetes     Patient Active Problem List   Diagnosis Date Noted   Iron deficiency anemia 03/27/2022    Past Surgical History:  Procedure Laterality Date   COLONOSCOPY     ESOPHAGOGASTRODUODENOSCOPY         Home Medications    Prior to Admission medications   Medication Sig Start Date End Date Taking? Authorizing Provider  amLODipine (NORVASC) 5 MG tablet Take 1 tablet (5 mg total) by mouth daily. Patient taking differently: Take 10 mg by mouth daily. 06/12/20  Yes Wieters, Hallie C, PA-C  aspirin 81 MG chewable tablet Chew by mouth daily.   Yes [provider]  DULoxetine (CYMBALTA) 20 MG capsule Take 20 mg by mouth daily.   Yes [provider]  famotidine (PEPCID) 20 MG tablet Take 1 tablet (20 mg total) by mouth daily. 09/12/19  Yes Yu, Amy V, PA-C  gabapentin (NEURONTIN) 300 MG capsule Take 300 mg by mouth 3 (three) times daily.   Yes [provider]  acetaminophen (TYLENOL) 500 MG tablet Take 500 mg by mouth every 6 (six) hours as needed. joint    [provider]  cholecalciferol (VITAMIN D) 1000 UNITS tablet Take  1,000 Units by mouth daily.    [provider]  OMEPRAZOLE PO Take by mouth daily.    [provider]    Family History Family History  Problem Relation Age of Onset   Stroke Mother    Other Father        patient was a kid when his Dad died, not sure of the cause   Colon cancer Neg Hx    Esophageal cancer Neg Hx     Social History Social History   Tobacco Use   Smoking status: Former    Types: Cigarettes   Smokeless tobacco: Never   Tobacco comments:    Not able to remember when he quit  Vaping Use   Vaping status: Never Used  Substance Use Topics   Alcohol use: Yes    Alcohol/week: 1.0 standard drink of alcohol    Types: 1 Cans of beer per week    Comment: social   Drug use: Yes    Frequency: 1.0 times per week    Types: Marijuana     Allergies   Lisinopril and Shellfish allergy   Review of Systems Review of Systems  Constitutional:  Negative for chills and fever.  HENT:  Negative for congestion, ear pain and sore throat.  Eyes:  Negative for discharge and redness.  Respiratory:  Positive for shortness of breath and wheezing. Negative for cough.   Cardiovascular:  Negative for chest pain.  Gastrointestinal:  Negative for abdominal pain, nausea and vomiting.     Physical Exam Triage Vital Signs ED Triage Vitals  Encounter Vitals Group     BP 10/20/22 1049 120/67     Systolic BP Percentile --      Diastolic BP Percentile --      Pulse Rate 10/20/22 1049 96     Resp 10/20/22 1049 (!) 36     Temp 10/20/22 1049 98.6 F (37 C)     Temp Source 10/20/22 1049 Oral     SpO2 10/20/22 1049 90 %     Weight 10/20/22 1047 235 lb (106.6 kg)     Height 10/20/22 1047 6\' 1"  (1.854 m)     Head Circumference --      Peak Flow --      Pain Score 10/20/22 1045 0     Pain Loc --      Pain Education --      Exclude from Growth Chart --    No data found.  Updated Vital Signs BP 120/67 (BP Location: Left Arm)   Pulse 95   Temp 98.6 F (37 C)  (Oral)   Resp (!) 28   Ht 6\' 1"  (1.854 m)   Wt 235 lb (106.6 kg)   SpO2 94% Comment: Recheck with seperate monitor  BMI 31.00 kg/m      Physical Exam Vitals and nursing note reviewed.  Constitutional:      General: He is not in acute distress.    Appearance: Normal appearance. He is not ill-appearing.  HENT:     Head: Normocephalic and atraumatic.     Nose: Nose normal. No congestion.     Mouth/Throat:     Mouth: Mucous membranes are moist.  Eyes:     Conjunctiva/sclera: Conjunctivae normal.  Cardiovascular:     Rate and Rhythm: Normal rate and regular rhythm.     Heart sounds: Normal heart sounds. No murmur heard. Pulmonary:     Effort: Pulmonary effort is normal. No respiratory distress.     Breath sounds: Wheezing (scattered) present.  Skin:    General: Skin is warm and dry.  Neurological:     Mental Status: He is alert.  Psychiatric:        Mood and Affect: Mood normal.        Thought Content: Thought content normal.      UC Treatments / Results  Labs (all labs ordered are listed, but only abnormal results are displayed) Labs Reviewed  SARS CORONAVIRUS 2 (TAT 6-24 HRS)    EKG   Radiology No results found.  Procedures Procedures (including critical care time)  Medications Ordered in UC Medications - No data to display  Initial Impression / Assessment and Plan / UC Course  I have reviewed the triage vital signs and the nursing notes.  Pertinent labs & imaging results that were available during my care of the patient were reviewed by me and considered in my medical decision making (see chart for details).    Recommended further evaluation in the emergency department for stat labs and imaging.  Patient is agreeable to same.   Final Clinical Impressions(s) / UC Diagnoses   Final diagnoses:  Wheezing   Discharge Instructions   None    ED Prescriptions   None    PDMP not  reviewed this encounter.   Tomi Bamberger, PA-C 10/23/22 [phone]

## 2022-10-20 NOTE — ED Notes (Signed)
Patient is being discharged from the Urgent Care and sent to the Emergency Department via POV . Per RM, patient is in need of higher level of care due to syncope/SOB. Patient is aware and verbalizes understanding of plan of care.  Vitals:   10/20/22 1053 10/20/22 1058  BP:    Pulse:    Resp:  (!) 28  Temp:    SpO2: 94%

## 2022-10-20 NOTE — ED Triage Notes (Signed)
Patient reports three days of DOE, wheezing at night. Unable to sleep during the night and sat up on the side of the bed to go to the doctor and became lightheaded around 0900 this morning. Symptoms worse with movement. States he feels "swimmy" when he walks.

## 2022-10-20 NOTE — ED Provider Notes (Signed)
[employer] [facility] EMERGENCY DEPARTMENT AT [facility] Provider Note   CSN: [id number] Arrival date & time: 10/20/22  1158     History Chief Complaint  Patient presents with   Shortness of Breath   Dizziness    Maxwell Harris is a 67 y.o. male with past medical history significant for hypertension, chronic GERD, prediabetes who presents to the emergency department with concerns of shortness of breath and dizziness.  Reports that he has been experiencing wheezing at night with orthopnea and typically has to prop himself up to improve work of breathing.  No prior pulmonary condition such as asthma or COPD.  Denies any recent sick contacts.  No recent fevers, cough, congestion.  Reports last time he had similar symptoms where he was feeling short of breath particularly on exertion this was due to iron deficiency anemia.  Feels that he can hear fluid moving in his chest.  No prior history of CHF.  Denies chest pain at this time. Denies hemoptysis, prior PE or DVT, prolonged travel, or recent surgery. Not on any hormonal medications.   Shortness of Breath Dizziness Associated symptoms: shortness of breath        Home Medications Prior to Admission medications   Medication Sig Start Date End Date Taking? Authorizing Provider  acetaminophen (TYLENOL) 500 MG tablet Take 500 mg by mouth every 6 (six) hours as needed. joint    [provider]  amLODipine (NORVASC) 5 MG tablet Take 1 tablet (5 mg total) by mouth daily. Patient taking differently: Take 10 mg by mouth daily. 06/12/20   Wieters, Hallie C, PA-C  aspirin 81 MG chewable tablet Chew by mouth daily.    [provider]  cholecalciferol (VITAMIN D) 1000 UNITS tablet Take 1,000 Units by mouth daily.    [provider]  DULoxetine (CYMBALTA) 20 MG capsule Take 20 mg by mouth daily.    [provider]  famotidine (PEPCID) 20 MG tablet Take 1 tablet (20 mg total) by mouth daily. 09/12/19   Cathie Hoops, Amy V,  PA-C  gabapentin (NEURONTIN) 300 MG capsule Take 300 mg by mouth 3 (three) times daily.    [provider]  OMEPRAZOLE PO Take by mouth daily.    [provider]      Allergies    Lisinopril and Shellfish allergy    Review of Systems   Review of Systems  Respiratory:  Positive for shortness of breath.   Neurological:  Positive for dizziness.  All other systems reviewed and are negative.   Physical Exam Updated Vital Signs BP (!) 142/87 (BP Location: Right Arm)   Pulse 97   Temp 97.7 F (36.5 C) (Oral)   Resp 20   SpO2 92%  Physical Exam Vitals and nursing note reviewed.  Constitutional:      General: He is not in acute distress.    Appearance: He is well-developed.  HENT:     Head: Normocephalic and atraumatic.  Eyes:     Conjunctiva/sclera: Conjunctivae normal.  Cardiovascular:     Rate and Rhythm: Normal rate and regular rhythm.     Heart sounds: No murmur heard. Pulmonary:     Effort: Pulmonary effort is normal. No respiratory distress.     Breath sounds: Normal breath sounds. No decreased breath sounds, wheezing, rhonchi or rales.  Abdominal:     Palpations: Abdomen is soft.     Tenderness: There is no abdominal tenderness.  Musculoskeletal:        General: No swelling.  Cervical back: Neck supple.  Skin:    General: Skin is warm and dry.     Capillary Refill: Capillary refill takes less than 2 seconds.  Neurological:     Mental Status: He is alert.  Psychiatric:        Mood and Affect: Mood normal.     ED Results / Procedures / Treatments   Labs (all labs ordered are listed, but only abnormal results are displayed) Labs Reviewed  CBC - Abnormal; Notable for the following components:      Result Value   RBC 4.16 (*)    Hemoglobin 12.2 (*)    HCT 36.4 (*)    All other components within normal limits  COMPREHENSIVE METABOLIC PANEL - Abnormal; Notable for the following components:   Potassium 3.3 (*)    Glucose, Bld 151 (*)     AST 55 (*)    ALT 69 (*)    All other components within normal limits  BRAIN NATRIURETIC PEPTIDE - Abnormal; Notable for the following components:   B Natriuretic Peptide 366.0 (*)    All other components within normal limits  I-STAT CHEM 8, ED - Abnormal; Notable for the following components:   Potassium 3.4 (*)    Glucose, Bld 149 (*)    Hemoglobin 12.9 (*)    HCT 38.0 (*)    All other components within normal limits  CBG MONITORING, ED - Abnormal; Notable for the following components:   Glucose-Capillary 120 (*)    All other components within normal limits  TROPONIN I (HIGH SENSITIVITY) - Abnormal; Notable for the following components:   Troponin I (High Sensitivity) 19 (*)    All other components within normal limits  SARS CORONAVIRUS 2 BY RT PCR  RESPIRATORY PANEL BY PCR  PROTIME-INR  APTT  DIFFERENTIAL  ETHANOL  RAPID URINE DRUG SCREEN, HOSP PERFORMED  BRAIN NATRIURETIC PEPTIDE  STREP PNEUMONIAE URINARY ANTIGEN  LEGIONELLA PNEUMOPHILA SEROGP 1 UR AG  RAPID URINE DRUG SCREEN, HOSP PERFORMED  HIV ANTIBODY (ROUTINE TESTING W REFLEX)  BASIC METABOLIC PANEL  TROPONIN I (HIGH SENSITIVITY)    EKG None  Radiology CT Angio Chest PE W and/or Wo Contrast  Result Date: 10/20/2022 CLINICAL DATA:  Short of breath EXAM: CT ANGIOGRAPHY CHEST WITH CONTRAST TECHNIQUE: Multidetector CT imaging of the chest was performed using the standard protocol during bolus administration of intravenous contrast. Multiplanar CT image reconstructions and MIPs were obtained to evaluate the vascular anatomy. RADIATION DOSE REDUCTION: This exam was performed according to the departmental dose-optimization program which includes automated exposure control, adjustment of the mA and/or kV according to patient size and/or use of iterative reconstruction technique. CONTRAST:  75mL OMNIPAQUE IOHEXOL 350 MG/ML SOLN COMPARISON:  10/20/2022 FINDINGS: Cardiovascular: This is a technically adequate evaluation of the  pulmonary vasculature. No filling defects or pulmonary emboli. Mild cardiomegaly, with prominent left ventricular dilatation. No pericardial effusion. Normal caliber of the thoracic aorta. Atherosclerosis of the aorta and coronary vasculature. Mediastinum/Nodes: Borderline enlarged mediastinal and hilar lymph nodes, largest measuring 14 mm in the subcarinal region image 67/5, nonspecific. Thyroid, trachea, and esophagus are unremarkable. Lungs/Pleura: There are trace bilateral pleural effusions, right greater than left. Scattered ground-glass opacities are seen within the dependent upper lobes and bilateral lower lobes, which may reflect a combination of hypoventilatory change and underlying inflammatory/infectious etiology. There is bilateral bronchial wall thickening, most pronounced within the lower lobes. No pneumothorax. Central airways are patent. Upper Abdomen: Hepatic steatosis. No acute upper abdominal findings. Musculoskeletal: No acute  displaced fracture. 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 Reconstructed images demonstrate no additional findings. Review of the MIP images confirms the above findings. IMPRESSION: 1. No evidence of pulmonary embolus. 2. Bilateral bronchial wall thickening, with scattered ground-glass airspace disease and trace bilateral pleural effusions, consistent with multifocal bronchopneumonia bronchitis. 3. Borderline mediastinal and hilar lymph nodes, consistent with reactive adenopathy. 4. Cardiomegaly, with prominent left ventricular dilatation. 5. Aortic Atherosclerosis (ICD10-I70.0). Coronary artery atherosclerosis. 6. Hepatic steatosis. Electronically Signed   By: Sharlet Salina M.D.   On: 10/20/2022 16:16   DG Chest 2 View  Result Date: 10/20/2022 CLINICAL DATA:  Shortness of breath EXAM: CHEST - 2 VIEW COMPARISON:  None Available. FINDINGS: No pneumothorax or effusion. Normal cardiopericardial  silhouette. Overlapping cardiac leads. No edema. Degenerative changes along the spine. IMPRESSION: No acute cardiopulmonary disease. Electronically Signed   By: Karen Kays M.D.   On: 10/20/2022 14:43    Procedures Procedures   Medications Ordered in ED Medications  aspirin chewable tablet 81 mg (has no administration in time range)  amLODipine (NORVASC) tablet 10 mg (has no administration in time range)  DULoxetine (CYMBALTA) DR capsule 20 mg (has no administration in time range)  pantoprazole (PROTONIX) EC tablet 40 mg (has no administration in time range)  gabapentin (NEURONTIN) capsule 300 mg (has no administration in time range)  sodium chloride flush (NS) 0.9 % injection 3 mL (has no administration in time range)  sodium chloride flush (NS) 0.9 % injection 3 mL (has no administration in time range)  0.9 %  sodium chloride infusion (has no administration in time range)  acetaminophen (TYLENOL) tablet 650 mg (has no administration in time range)  ondansetron (ZOFRAN) injection 4 mg (has no administration in time range)  furosemide (LASIX) injection 40 mg (has no administration in time range)  cefTRIAXone (ROCEPHIN) 2 g in sodium chloride 0.9 % 100 mL IVPB (2 g Intravenous New Bag/Given 10/20/22 1824)  azithromycin (ZITHROMAX) tablet 500 mg (has no administration in time range)  hydrALAZINE (APRESOLINE) injection 5 mg (has no administration in time range)  enoxaparin (LOVENOX) injection 50 mg (has no administration in time range)  iohexol (OMNIPAQUE) 350 MG/ML injection 75 mL (75 mLs Intravenous Contrast Given 10/20/22 1525)  furosemide (LASIX) injection 40 mg (40 mg Intravenous Given 10/20/22 1728)  potassium chloride SA (KLOR-CON M) CR tablet 40 mEq (40 mEq Oral Given 10/20/22 1727)    ED Course/ Medical Decision Making/ A&P Clinical Course as of 10/20/22 1828  Tue Oct 20, 2022  1639 I was consulted on the care of this patient.  New onset heart failure exacerbation requiring oxygen at  2 L for stabilization of hypoxic respiratory failure.  [CC]    Clinical Course User Index [CC] Glyn Ade, MD                               Medical Decision Making Amount and/or Complexity of Data Reviewed Labs: ordered. Radiology: ordered.  Risk Prescription drug management. Decision regarding hospitalization.   This patient presents to the ED for concern of shortness of breath. Differential diagnosis includes PE, pneumonia, CHF, viral URI   Lab Tests:  I Ordered, and personally interpreted labs.  The pertinent results include: CBC with anemia noted with decline in red blood cells, hemoglobin, hematocrit, CMP is unremarkable for a slight decrease in potassium at 3.3, AST and ALT elevated at 55  and 69 respectively, ethanol negative, BNP elevated at 366, troponin elevated at 19 without the troponin pending, COVID-19 negative   Imaging Studies ordered:  I ordered imaging studies including chest x-ray, CT angio chest I independently visualized and interpreted imaging which showed no acute cardiopulmonary disease on x-ray, CT angio chest negative for PE however concerning for possible multifocal bronchopneumonia and possible fluid overload with some cardiomegaly with left ventricular dilation noted I agree with the radiologist interpretation   Medicines ordered and prescription drug management:  I ordered medication including Lasix, potassium chloride for congestive heart failure, hypokalemia Reevaluation of the patient after these medicines showed that the patient stayed the same I have reviewed the patients home medicines and have made adjustments as needed   Problem List / ED Course:  Patient presented to the with concerns of shortness of breath.  Patient reports that his shortness of breath is largely been orthopneic in nature as he has noted worsening shortness of breath with Reece Leader down at night.  Some problem self up with a pillow overnight to help with work of  breathing.  Denies any use of oxygen at home at this time. Initial presentation concerning for shortness with positional change but no acute signs of respiratory distress noted on exam. Lab workup is large unremarkable with some baseline anemia noted at with the CBC, CMP largely unremarkable however transaminitis noted with AST and ALT elevation and mild hypokalemia 3.3, patient's BNP is however elevated at 366 which is concerning for possible CHF.  Patient troponin is slightly elevated at 19 but likely due to fluid overload, ethanol negative, APTT unremarkable, COVID-19 negative, delta troponin pending at time of admission. CT angio chest concerning for possible multifocal bronchopneumonia versus possible cardiomegaly secondary to fluid overload concern for CHF.  Given the patient has not had any fevers, cough, or elevation in white blood count, suspect this is more likely be fluid overload.  Will consult hospitalist for admission with likely new onset CHF but also discussed possibility of multifocal bronchopneumonia and potential need of antibiotics. Consulted with Dr.Elgergawy, hospitalist, for admission.  He will evaluate patient for admission for new onset CHF as well as new hypoxia requiring supplemental O2. Dr. Randol Kern agreeable for plan of admission at this time. Patient aware of this plan and is also in agreement.  Final Clinical Impression(s) / ED Diagnoses Final diagnoses:  Shortness of breath    Rx / DC Orders ED Discharge Orders     None         Smitty Knudsen, PA-C 10/20/22 Minus Breeding, MD 10/20/22 1858

## 2022-10-20 NOTE — Progress Notes (Signed)
Patient  has arrived to the unit via stretcher from ED. A/O x4. No pain at this time. VSS. Placed on tele NSR. Oriented patient to the room and staff. Education provided regarding safety precaution and patient verbalize understanding.

## 2022-10-20 NOTE — ED Notes (Signed)
Provider notified of Vital Signs and acuity level.

## 2022-10-20 NOTE — ED Triage Notes (Signed)
"  I started yesterday with wheezing and just don't feel good". No cough. No runny nose. "Weak this morning". No nausea or vomiting. No new/unexplained rash. No sore throat. No chest pain. No sob.

## 2022-10-20 NOTE — Discharge Instructions (Signed)
  Please report to ED after leaving urgent care.

## 2022-10-20 NOTE — ED Notes (Signed)
Patient transported to CT 

## 2022-10-20 NOTE — ED Notes (Signed)
ED TO INPATIENT HANDOFF REPORT  ED Nurse Name and Phone #: Donny Pique, RN [phone]  S Name/Age/Gender Maxwell Harris 67 y.o. male Room/Bed: 009C/009C  Code Status   Code Status: Not on file  Home/SNF/Other Home Patient oriented to: self, place, time, and situation Is this baseline? Yes   Triage Complete: Triage complete  Chief Complaint Dyspnea [R06.00]  Triage Note Patient reports three days of DOE, wheezing at night. Unable to sleep during the night and sat up on the side of the bed to go to the doctor and became lightheaded around 0900 this morning. Symptoms worse with movement. States he feels "swimmy" when he walks.    Allergies Allergies  Allergen Reactions   Lisinopril Swelling   Shellfish Allergy     Level of Care/Admitting Diagnosis ED Disposition     ED Disposition  Admit   Condition  --   Comment  Hospital Area: [facility] [[id number]]  Level of Care: Telemetry Medical [104]  May place patient in observation at [facility] or [facility] if equivalent level of care is available:: No  Covid Evaluation: Confirmed COVID Negative  Diagnosis: Dyspnea [191478]  Admitting Physician: Chiquita Loth  Attending Physician: Randol Kern, DAWOOD S [[id number]]          B Medical/Surgery History Past Medical History:  Diagnosis Date   Chronic GERD    Hypertension    Pain    Pre-diabetes    Past Surgical History:  Procedure Laterality Date   COLONOSCOPY     ESOPHAGOGASTRODUODENOSCOPY       A IV Location/Drains/Wounds Patient Lines/Drains/Airways Status     Active Line/Drains/Airways     Name Placement date Placement time Site Days   Peripheral IV 10/20/22 20 G Left Antecubital 10/20/22  1425  Antecubital  less than 1            Intake/Output Last 24 hours No intake or output data in the 24 hours ending 10/20/22 1647  Labs/Imaging Results for orders placed or performed during the hospital encounter of 10/20/22 (from the past  48 hour(s))  Protime-INR     Status: None   Collection Time: 10/20/22 12:22 PM  Result Value Ref Range   Prothrombin Time 14.9 11.4 - 15.2 seconds   INR 1.2 0.8 - 1.2    Comment: (NOTE) INR goal varies based on device and disease states. Performed at [facility], [address] [place], [place] [address]   APTT     Status: None   Collection Time: 10/20/22 12:22 PM  Result Value Ref Range   aPTT 30 24 - 36 seconds    Comment: Performed at [facility], [address] [place], [place] [address]  CBC     Status: Abnormal   Collection Time: 10/20/22 12:22 PM  Result Value Ref Range   WBC 8.1 4.0 - 10.5 K/uL   RBC 4.16 (L) 4.22 - 5.81 MIL/uL   Hemoglobin 12.2 (L) 13.0 - 17.0 g/dL   HCT 57.8 (L) 46.9 - 62.9 %   MCV 87.5 80.0 - 100.0 fL   MCH 29.3 26.0 - 34.0 pg   MCHC 33.5 30.0 - 36.0 g/dL   RDW 52.8 41.3 - 24.4 %   Platelets 243 150 - 400 K/uL   nRBC 0.0 0.0 - 0.2 %    Comment: Performed at [facility], [address] [place], [place] [address]  Differential     Status: None   Collection Time: 10/20/22  12:22 PM  Result Value Ref Range   Neutrophils Relative % 75 %   Neutro Abs 6.1 1.7 - 7.7 K/uL   Lymphocytes Relative 10 %   Lymphs Abs 0.8 0.7 - 4.0 K/uL   Monocytes Relative 11 %   Monocytes Absolute 0.9 0.1 - 1.0 K/uL   Eosinophils Relative 3 %   Eosinophils Absolute 0.2 0.0 - 0.5 K/uL   Basophils Relative 1 %   Basophils Absolute 0.1 0.0 - 0.1 K/uL   Immature Granulocytes 0 %   Abs Immature Granulocytes 0.03 0.00 - 0.07 K/uL    Comment: Performed at  [facility], [address], [place], [place] [address]  Comprehensive metabolic panel     Status: Abnormal   Collection Time: 10/20/22 12:22 PM  Result Value Ref Range   Sodium 138 135 - 145 mmol/L   Potassium 3.3 (L) 3.5 - 5.1 mmol/L   Chloride 104 98 - 111 mmol/L   CO2 22 22 - 32 mmol/L   Glucose, Bld 151 (H) 70 - 99 mg/dL    Comment: Glucose reference range applies only to samples taken  after fasting for at least 8 hours.   BUN 14 8 - 23 mg/dL   Creatinine, Ser 3.08 0.61 - 1.24 mg/dL   Calcium 9.3 8.9 - 65.7 mg/dL   Total Protein 7.2 6.5 - 8.1 g/dL   Albumin 3.7 3.5 - 5.0 g/dL   AST 55 (H) 15 - 41 U/L   ALT 69 (H) 0 - 44 U/L   Alkaline Phosphatase 75 38 - 126 U/L   Total Bilirubin 0.3 0.3 - 1.2 mg/dL   GFR, Estimated >84 >69 mL/min    Comment: (NOTE) Calculated using the CKD-EPI Creatinine Equation (2021)    Anion gap 12 5 - 15    Comment: Performed at [facility], [address] [place], [place] [address]  Ethanol     Status: None   Collection Time: 10/20/22 12:22 PM  Result Value Ref Range   Alcohol, Ethyl (B) <10 <10 mg/dL    Comment: (NOTE) Lowest detectable limit for serum alcohol is 10 mg/dL.  For medical purposes only. Performed at [facility], [address], [place], [place] [address]   Brain natriuretic peptide     Status: Abnormal   Collection Time: 10/20/22 12:22 PM  Result Value Ref Range   B Natriuretic Peptide 366.0 (H) 0.0 - 100.0 pg/mL    Comment: Performed at [facility], [address] [place], [place] [address]  I-stat chem 8, ED     Status: Abnormal   Collection Time: 10/20/22 12:31 PM  Result Value Ref Range   Sodium 140 135 - 145 mmol/L   Potassium 3.4 (L) 3.5 - 5.1 mmol/L   Chloride 104 98 - 111 mmol/L   BUN 15 8 - 23 mg/dL   Creatinine, Ser 2.72 0.61 - 1.24 mg/dL   Glucose, Bld 536 (H) 70 - 99 mg/dL    Comment: Glucose reference range applies only to samples taken after fasting for at least 8 hours.   Calcium, Ion 1.18 1.15 - 1.40 mmol/L   TCO2 22 22 - 32 mmol/L   Hemoglobin 12.9 (L) 13.0 - 17.0 g/dL   HCT 64.4 (L) 03.4 - 74.2 %  CBG monitoring, ED     Status: Abnormal   Collection Time: 10/20/22 12:37 PM  Result Value Ref Range   Glucose-Capillary 120 (H) 70 - 99 mg/dL    Comment: Glucose  reference range applies only to samples taken after fasting for at least 8 hours.  SARS Coronavirus 2 by RT PCR  (hospital order, performed in [facility] hospital lab) *cepheid single result test* Anterior Nasal Swab     Status: None   Collection Time: 10/20/22  1:33 PM   Specimen: Anterior Nasal Swab  Result Value Ref Range   SARS Coronavirus 2 by RT PCR NEGATIVE NEGATIVE    Comment: Performed at [facility], [address], [place], [place] [address]  Troponin I (High Sensitivity)     Status: Abnormal   Collection Time: 10/20/22  2:25 PM  Result Value Ref Range   Troponin I (High Sensitivity) 19 (H) <18 ng/L    Comment: (NOTE) Elevated high sensitivity troponin I (hsTnI) values and significant  changes across serial measurements may suggest ACS but many other  chronic and acute conditions are known to elevate hsTnI results.  Refer to the "Links" section for chest pain algorithms and additional  guidance. Performed at [facility], [address], [place], [place] [address]    CT Angio Chest PE W and/or Wo Contrast  Result Date: 10/20/2022 CLINICAL DATA:  Short of breath EXAM: CT ANGIOGRAPHY CHEST WITH CONTRAST TECHNIQUE: Multidetector CT imaging of the chest was performed using the standard protocol during bolus administration of intravenous contrast. Multiplanar CT image reconstructions and MIPs were obtained to evaluate the vascular anatomy. RADIATION DOSE REDUCTION: This exam was performed according to the departmental dose-optimization program which includes automated exposure control, adjustment of the mA and/or kV according to patient size and/or use of iterative reconstruction technique. CONTRAST:  75mL OMNIPAQUE IOHEXOL 350 MG/ML SOLN COMPARISON:  10/20/2022 FINDINGS: Cardiovascular: This is a technically adequate evaluation of the pulmonary vasculature. No filling defects or pulmonary emboli. Mild cardiomegaly, with prominent left ventricular dilatation. No pericardial effusion. Normal caliber of the thoracic aorta. Atherosclerosis of the aorta and coronary vasculature.  Mediastinum/Nodes: Borderline enlarged mediastinal and hilar lymph nodes, largest measuring 14 mm in the subcarinal region image 67/5, nonspecific. Thyroid, trachea, and esophagus are unremarkable. Lungs/Pleura: There are trace bilateral pleural effusions, right greater than left. Scattered ground-glass opacities are seen within the dependent upper lobes and bilateral lower lobes, which may reflect a combination of hypoventilatory change and underlying inflammatory/infectious etiology. There is bilateral bronchial wall thickening, most pronounced within the lower lobes. No pneumothorax. Central airways are patent. Upper Abdomen: Hepatic steatosis. No acute upper abdominal findings. Musculoskeletal: No acute displaced fracture. 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 Reconstructed images demonstrate no additional findings. Review of the MIP images confirms the above findings. IMPRESSION: 1. No evidence of pulmonary embolus. 2. Bilateral bronchial wall thickening, with scattered ground-glass airspace disease and trace bilateral pleural effusions, consistent with multifocal bronchopneumonia bronchitis. 3. Borderline mediastinal and hilar lymph nodes, consistent with reactive adenopathy. 4. Cardiomegaly, with prominent left ventricular dilatation. 5. Aortic Atherosclerosis (ICD10-I70.0). Coronary artery atherosclerosis. 6. Hepatic steatosis. Electronically Signed   By: Sharlet Salina M.D.   On: 10/20/2022 16:16   DG Chest 2 View  Result Date: 10/20/2022 CLINICAL DATA:  Shortness of breath EXAM: CHEST - 2 VIEW COMPARISON:  None Available. FINDINGS: No pneumothorax or effusion. Normal cardiopericardial silhouette. Overlapping cardiac leads. No edema. Degenerative changes along the spine. IMPRESSION: No acute cardiopulmonary disease. Electronically Signed   By: Karen Kays M.D.   On: 10/20/2022 14:43    Pending Labs Unresulted Labs (From  admission, onward)  Start     Ordered   10/20/22 1643  Respiratory (~20 pathogens) panel by PCR  (Respiratory panel by PCR (~20 pathogens, ~24 hr TAT)  w precautions)  Once,   URGENT        10/20/22 1642            Vitals/Pain Today's Vitals   10/20/22 1345 10/20/22 1347 10/20/22 1425 10/20/22 1445  BP: 133/81   136/86  Pulse: 89 86  88  Resp: (!) 22 18  18   Temp:      TempSrc:      SpO2: (!) 88% 94%  95%  PainSc:   0-No pain     Isolation Precautions Droplet precaution  Medications Medications  furosemide (LASIX) injection 40 mg (has no administration in time range)  potassium chloride SA (KLOR-CON M) CR tablet 40 mEq (has no administration in time range)  iohexol (OMNIPAQUE) 350 MG/ML injection 75 mL (75 mLs Intravenous Contrast Given 10/20/22 1525)    Mobility walks     Focused Assessments    R Recommendations: See Admitting Provider Note  Report given to:   Additional Notes: Patient is A&Ox4, ambulatory, shortness of breath with exertion so minimally able to tolerate ambulation. No pain reported but he is frustrated with having to be in the hospital, optimistically he will stay.

## 2022-10-21 ENCOUNTER — Observation Stay (HOSPITAL_COMMUNITY): Payer: No Typology Code available for payment source

## 2022-10-21 ENCOUNTER — Encounter (HOSPITAL_COMMUNITY): Payer: Self-pay | Admitting: Internal Medicine

## 2022-10-21 DIAGNOSIS — E876 Hypokalemia: Secondary | ICD-10-CM | POA: Diagnosis present

## 2022-10-21 DIAGNOSIS — G4733 Obstructive sleep apnea (adult) (pediatric): Secondary | ICD-10-CM | POA: Diagnosis present

## 2022-10-21 DIAGNOSIS — I110 Hypertensive heart disease with heart failure: Secondary | ICD-10-CM | POA: Diagnosis present

## 2022-10-21 DIAGNOSIS — I5021 Acute systolic (congestive) heart failure: Secondary | ICD-10-CM | POA: Diagnosis present

## 2022-10-21 DIAGNOSIS — I2510 Atherosclerotic heart disease of native coronary artery without angina pectoris: Secondary | ICD-10-CM | POA: Diagnosis present

## 2022-10-21 DIAGNOSIS — I447 Left bundle-branch block, unspecified: Secondary | ICD-10-CM | POA: Diagnosis present

## 2022-10-21 DIAGNOSIS — D509 Iron deficiency anemia, unspecified: Secondary | ICD-10-CM | POA: Diagnosis present

## 2022-10-21 DIAGNOSIS — Z6831 Body mass index (BMI) 31.0-31.9, adult: Secondary | ICD-10-CM | POA: Diagnosis not present

## 2022-10-21 DIAGNOSIS — R0602 Shortness of breath: Secondary | ICD-10-CM | POA: Diagnosis not present

## 2022-10-21 DIAGNOSIS — G8929 Other chronic pain: Secondary | ICD-10-CM | POA: Diagnosis present

## 2022-10-21 DIAGNOSIS — Z7984 Long term (current) use of oral hypoglycemic drugs: Secondary | ICD-10-CM | POA: Diagnosis not present

## 2022-10-21 DIAGNOSIS — Z87891 Personal history of nicotine dependence: Secondary | ICD-10-CM | POA: Diagnosis not present

## 2022-10-21 DIAGNOSIS — Z7982 Long term (current) use of aspirin: Secondary | ICD-10-CM | POA: Diagnosis not present

## 2022-10-21 DIAGNOSIS — Z1152 Encounter for screening for COVID-19: Secondary | ICD-10-CM | POA: Diagnosis not present

## 2022-10-21 DIAGNOSIS — Z91013 Allergy to seafood: Secondary | ICD-10-CM | POA: Diagnosis not present

## 2022-10-21 DIAGNOSIS — Z634 Disappearance and death of family member: Secondary | ICD-10-CM | POA: Diagnosis not present

## 2022-10-21 DIAGNOSIS — E1140 Type 2 diabetes mellitus with diabetic neuropathy, unspecified: Secondary | ICD-10-CM | POA: Diagnosis present

## 2022-10-21 DIAGNOSIS — Z888 Allergy status to other drugs, medicaments and biological substances status: Secondary | ICD-10-CM | POA: Diagnosis not present

## 2022-10-21 DIAGNOSIS — F1010 Alcohol abuse, uncomplicated: Secondary | ICD-10-CM | POA: Diagnosis present

## 2022-10-21 DIAGNOSIS — F1410 Cocaine abuse, uncomplicated: Secondary | ICD-10-CM | POA: Diagnosis present

## 2022-10-21 DIAGNOSIS — R0609 Other forms of dyspnea: Secondary | ICD-10-CM | POA: Diagnosis not present

## 2022-10-21 DIAGNOSIS — E669 Obesity, unspecified: Secondary | ICD-10-CM | POA: Diagnosis present

## 2022-10-21 DIAGNOSIS — K219 Gastro-esophageal reflux disease without esophagitis: Secondary | ICD-10-CM | POA: Diagnosis present

## 2022-10-21 DIAGNOSIS — M879 Osteonecrosis, unspecified: Secondary | ICD-10-CM | POA: Diagnosis present

## 2022-10-21 DIAGNOSIS — I2489 Other forms of acute ischemic heart disease: Secondary | ICD-10-CM | POA: Diagnosis present

## 2022-10-21 DIAGNOSIS — Z79899 Other long term (current) drug therapy: Secondary | ICD-10-CM | POA: Diagnosis not present

## 2022-10-21 LAB — BASIC METABOLIC PANEL
Anion gap: 13 (ref 5–15)
BUN: 12 mg/dL (ref 8–23)
CO2: 26 mmol/L (ref 22–32)
Calcium: 9.6 mg/dL (ref 8.9–10.3)
Chloride: 100 mmol/L (ref 98–111)
Creatinine, Ser: 1.12 mg/dL (ref 0.61–1.24)
GFR, Estimated: >60 mL/min (ref >60)
Glucose, Bld: 126 mg/dL — ABNORMAL HIGH (ref 70–99)
Potassium: 3.3 mmol/L — ABNORMAL LOW (ref 3.5–5.1)
Sodium: 139 mmol/L (ref 135–145)

## 2022-10-21 LAB — RAPID URINE DRUG SCREEN, HOSP PERFORMED
Amphetamines: NOT DETECTED
Barbiturates: NOT DETECTED
Benzodiazepines: NOT DETECTED
Cocaine: POSITIVE — AB
Opiates: NOT DETECTED
Tetrahydrocannabinol: POSITIVE — AB

## 2022-10-21 LAB — ECHOCARDIOGRAM COMPLETE
Area-P 1/2: 4.49 cm2
Est EF: <20
Height: 72.992 in
MV M vel: 0.98 m/s
MV Peak grad: 3.8 mmHg
S' Lateral: 6.4 cm
Weight: 3756.64 [oz_av]

## 2022-10-21 LAB — STREP PNEUMONIAE URINARY ANTIGEN: Strep Pneumo Urinary Antigen: NEGATIVE

## 2022-10-21 LAB — SARS CORONAVIRUS 2 (TAT 6-24 HRS): SARS Coronavirus 2: NEGATIVE

## 2022-10-21 LAB — BRAIN NATRIURETIC PEPTIDE: B Natriuretic Peptide: 564 pg/mL — ABNORMAL HIGH (ref 0.0–100.0)

## 2022-10-21 LAB — HIV ANTIBODY (ROUTINE TESTING W REFLEX): HIV Screen 4th Generation wRfx: NONREACTIVE

## 2022-10-21 MED ORDER — LOSARTAN POTASSIUM 25 MG PO TABS
25.0000 mg | ORAL_TABLET | Freq: Every day | ORAL | Status: DC
  Administered 2022-10-21 – 2022-10-23 (×3): 25 mg via ORAL
  Filled 2022-10-21 (×3): qty 1

## 2022-10-21 MED ORDER — EMPAGLIFLOZIN 10 MG PO TABS
10.0000 mg | ORAL_TABLET | Freq: Every day | ORAL | Status: DC
  Administered 2022-10-21 – 2022-10-23 (×3): 10 mg via ORAL
  Filled 2022-10-21 (×4): qty 1

## 2022-10-21 MED ORDER — ASPIRIN 81 MG PO CHEW
81.0000 mg | CHEWABLE_TABLET | ORAL | Status: AC
  Administered 2022-10-22: 81 mg via ORAL
  Filled 2022-10-21: qty 1

## 2022-10-21 MED ORDER — PERFLUTREN LIPID MICROSPHERE
1.0000 mL | INTRAVENOUS | Status: AC | PRN
  Administered 2022-10-21: 4 mL via INTRAVENOUS

## 2022-10-21 MED ORDER — FUROSEMIDE 10 MG/ML IJ SOLN
40.0000 mg | Freq: Once | INTRAMUSCULAR | Status: AC
  Administered 2022-10-21: 40 mg via INTRAVENOUS
  Filled 2022-10-21: qty 4

## 2022-10-21 MED ORDER — FUROSEMIDE 10 MG/ML IJ SOLN
INTRAMUSCULAR | Status: AC
  Filled 2022-10-21: qty 2

## 2022-10-21 MED ORDER — FUROSEMIDE 10 MG/ML IJ SOLN: 60.0000 mg | Freq: Once | INTRAMUSCULAR | Status: DC

## 2022-10-21 MED ORDER — POTASSIUM CHLORIDE CRYS ER 20 MEQ PO TBCR
40.0000 meq | EXTENDED_RELEASE_TABLET | ORAL | Status: AC
  Administered 2022-10-21 (×2): 40 meq via ORAL
  Filled 2022-10-21 (×2): qty 2

## 2022-10-21 MED ORDER — SODIUM CHLORIDE 0.9 % IV SOLN: INTRAVENOUS | Status: DC

## 2022-10-21 MED ORDER — SPIRONOLACTONE 12.5 MG HALF TABLET
12.5000 mg | ORAL_TABLET | Freq: Every day | ORAL | Status: DC
  Administered 2022-10-21: 12.5 mg via ORAL
  Filled 2022-10-21 (×2): qty 1

## 2022-10-21 NOTE — Progress Notes (Signed)
Progress Note   Patient: Maxwell Harris OHY:[id number] DOB: [birth year] DOA: 10/20/2022     0 DOS: the patient was seen and examined on 10/21/2022   Subjective:  Patient seen and examined at bedside this morning Respiratory panel reviewed and unrevealing Echocardiogram pending Potassium noted to be low and have been repleted  Brief hospital course: From HPI "[name]  is a 67 y.o. male, with past medical history for hypertension, GERD, prediabetes, neuropathic pain, presents to ED secondary to complaints of shortness of breath, patient reports dyspnea, wheezing, orthopnea at nighttime, he denies any history of tobacco use or abuse, but reports smoking THC, and snorting cocaine occasionally) most recent 48 hours ago), asthma, COPD, and history of coronary artery disease, denies any chest pain, reports symptoms has been progressive over the last 3 days, no nausea, no vomiting, no fever, no chills, no sick contact -NAD workup significant for potassium of 3.4, EKG significant for old left bundle branch block, CTA chest negative for PE, but significant for bilateral bronchial wall thickening, with scattered groundglass airspace disease and trace bilateral effusion, consistent with multifocal bronchopneumonia bronchitis, cardiomegaly with prominent left ventricle dilation, ENP elevated at 366, patient received IV Lasix and [facility] hospitalist consulted to admit.  "  Assessment and Plan: Principal Problem:   Dyspnea Active Problems:   Essential hypertension   Acute CHF (HCC)   Polysubstance (excluding opioids) dependence (HCC)       Dyspnea is likely due to acute congestive heart failure (unspecified) Possible community-acquired pneumonia -Patient presents with dyspnea, CTA chest negative for PE however with concerns of atypical infection versus fluid overload  Respiratory panel results came back negative  -Patient presented with elevated BNP, elevated CVP and lower extremity  edema Continue telemetry monitoring on Lasix 40 mg IV daily We will continue IV Lasix for now Follow-up on echocardiogram report to rule out heart failure -EKG with left bundle branch block, appears to be old on previous EKG in 2018 Denies any chest pains -Continue IV Rocephin and azithromycin, check Legionella antigen, pneumonia antigen -Encouraged use incentive spirometry and flutter valve   Hypertension -Continue Norvasc Continued hydralazine   Substance abuse -Patient endorse smoking THC and snorting cocaine -He was counseled   Obesity BMI of 31 Nonsudden weight loss with diet and exercise when medically stable   Neuropathic pain Continue with gabapentin   Hypokalemia -Repleted   GERD-continue PPI therapy   DVT Prophylaxis  Lovenox     Family Communication: No family present at bedside at this time   Code Status full  Physical Exam: General: Elderly male laying in bed in no acute distress HEENT: Atraumatic normocephalic CVS: S1-S2 present no murmur, basal crackles noted Resp respiratory: Basilar crackles noted normal breath sounds Musculoskeletal patient with trace peripheral edema CNS: Alert and awake oriented moving all extremities equally  Vitals:   10/21/22 0443 10/21/22 0700 10/21/22 0745 10/21/22 0828  BP: (!) 157/109  (!) 137/97 (!) 127/98  Pulse: 97  97 97  Resp: 18     Temp: 97.8 F (36.6 C)  97.8 F (36.6 C)   TempSrc: Oral     SpO2: 96%  96%   Weight:  106.5 kg    Height:  6' 0.99" (1.854 m)      Data Reviewed: I have reviewed patient's lab results showing elevated BNP of 564 also with decreased potassium 3.3 respiratory panel negative echo pending -I personally reviewed patient's EKG showing left bundle branch block but this is unchanged from old EKG in  2018      Latest Ref Rng & Units 10/20/2022   12:31 PM 10/20/2022   12:22 PM 08/25/2022    1:31 PM  CBC  WBC 4.0 - 10.5 K/uL  8.1  6.1   Hemoglobin 13.0 - 17.0 g/dL 16.1  09.6  04.5    Hematocrit 39.0 - 52.0 % 38.0  36.4  36.2   Platelets 150 - 400 K/uL  243  241        Latest Ref Rng & Units 10/21/2022    5:38 AM 10/20/2022   12:31 PM 10/20/2022   12:22 PM  CMP  Glucose 70 - 99 mg/dL 409  811  914   BUN 8 - 23 mg/dL 12  15  14    Creatinine 0.61 - 1.24 mg/dL 7.82  9.56  2.13   Sodium 135 - 145 mmol/L 139  140  138   Potassium 3.5 - 5.1 mmol/L 3.3  3.4  3.3   Chloride 98 - 111 mmol/L 100  104  104   CO2 22 - 32 mmol/L 26   22   Calcium 8.9 - 10.3 mg/dL 9.6   9.3   Total Protein 6.5 - 8.1 g/dL   7.2   Total Bilirubin 0.3 - 1.2 mg/dL   0.3   Alkaline Phos 38 - 126 U/L   75   AST 15 - 41 U/L   55   ALT 0 - 44 U/L   69     Time spent: 55 minutes spent reviewing patient's admission H&P, previous record, EKG, chest x-ray as well as CT angio of the chest, CBC as well as BMP  Author: Loyce Dys, MD 10/21/2022 12:44 PM  For on call review [email or web address].

## 2022-10-21 NOTE — Progress Notes (Signed)
  Echocardiogram 2D Echocardiogram has been performed.  Maxwell Harris 10/21/2022, 3:35 PM

## 2022-10-21 NOTE — H&P (View-Only) (Signed)
Advanced Heart Failure Team Consult Note   Primary Physician: Sherwood Gambler, MD PCP-Cardiologist:  [facility]  Reason for Consultation: Acute systolic CHF  HPI:    Maxwell Harris is seen today for evaluation of acute systolic CHF at the request of Dr. Meriam Sprague with [facility]. 67 y.o. male with history of LBBB (noted on ECGs in 2018), HTN, OSA on CPAP, iron deficiency anemia, GERD, DM, alcohol, THC and cocaine use. [name] most of his care through the [place].  Presented to ED 10/20/22 with complaints of shortness of breath, dizziness, and orthopnea for a couple of days. Labs significant for Scr 1.18, CO2 22, K 3.3, Na 138, ethanol negative, BNP 366, HS troponin 19>23, AST/ALT mildly elevated. ECG with sinus rhythm 90 bpm, PVCs, LBBB and QRS 160 ms. CTA chest negative for PE. There was b/l bronchial wall thickening w/ scattered ground glass airspace disease concerning for possible bronchopneumonia. He was given IV lasix and admitted to hospitalist service for acute CHF and respiratory failure requiring supplemental O2. Started on empiric abx for possible PNA. Respiratory panel negative. UDS + for cocaine and THC. Reported use 48 hrs prior.  On 40 mg IV lasix daily.   Echo today: LVEF < 20%, RV okay, dilated IVC with estimated RAP of 15  Advanced heart failure asked to see to assist with management of acute systolic CHF.   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 Has chronic pain in knees and hips from osteoarthritis and osteonecrosis of the hips.  Drinks 2-3 shots a day and chases with beer since 1990s. Uses cocaine occasionally, can't afford regular use.   No family history of CHF.   Home Medications Prior to Admission medications   Medication Sig Start Date End Date Taking? Authorizing Provider  acetaminophen (TYLENOL) 500 MG tablet Take 500 mg by mouth every 6 (six) hours as needed. joint   Yes [provider]  amLODipine (NORVASC) 5 MG tablet Take 1 tablet (5 mg total) by mouth daily.  06/12/20  Yes Wieters, Hallie C, PA-C  aspirin 81 MG chewable tablet Chew 81 mg by mouth daily.   Yes [provider]  cholecalciferol (VITAMIN D) 1000 UNITS tablet Take 1,000 Units by mouth daily.   Yes [provider]  cycloSPORINE (RESTASIS) 0.05 % ophthalmic emulsion Place 1 drop into both eyes 2 (two) times daily. 05/15/22  Yes [provider]  diclofenac Sodium (VOLTAREN) 1 % GEL Apply 2 g topically 4 (four) times daily as needed (pain). 03/03/22  Yes [provider]  DULoxetine (CYMBALTA) 20 MG capsule Take 20 mg by mouth daily.   Yes [provider]  famotidine (PEPCID) 20 MG tablet Take 1 tablet (20 mg total) by mouth daily. 09/12/19  Yes Yu, Amy V, PA-C  gabapentin (NEURONTIN) 300 MG capsule Take 300 mg by mouth 3 (three) times daily.   Yes [provider]  OMEPRAZOLE PO Take 20 mg by mouth daily.   Yes [provider]  sildenafil (VIAGRA) 100 MG tablet Take 100 mg by mouth as needed for erectile dysfunction. 09/15/22  Yes [provider]    Past Medical History: Past Medical History:  Diagnosis Date   Chronic GERD    Hypertension    Pain    Pre-diabetes     Past Surgical History: Past Surgical History:  Procedure Laterality Date   COLONOSCOPY     ESOPHAGOGASTRODUODENOSCOPY      Family History: Family History  Problem Relation Age of Onset  Stroke Mother    Other Father        patient was a kid when his Dad died, not sure of the cause   Colon cancer Neg Hx    Esophageal cancer Neg Hx     Social History: Social History   Socioeconomic History   Marital status: Single    Spouse name: Not on file   Number of children: 1   Years of education: Not on file   Highest education level: Not on file  Occupational History   Occupation: retired  Tobacco Use   Smoking status: Former    Types: Cigarettes   Smokeless tobacco: Never   Tobacco comments:    Not able to remember when he quit  Vaping Use    Vaping status: Never Used  Substance and Sexual Activity   Alcohol use: Yes    Alcohol/week: 1.0 standard drink of alcohol    Types: 1 Cans of beer per week    Comment: social   Drug use: Yes    Frequency: 1.0 times per week    Types: Marijuana   Sexual activity: Not Currently  Other Topics Concern   Not on file  Social History Narrative   Not on file   Social Determinants of Health   Financial Resource Strain: Not on file  Food Insecurity: No Food Insecurity (10/20/2022)   Hunger Vital Sign    Worried About Running Out of Food in the Last Year: Never true    Ran Out of Food in the Last Year: Never true  Transportation Needs: No Transportation Needs (10/20/2022)   PRAPARE - Administrator, Civil Service (Medical): No    Lack of Transportation (Non-Medical): No  Physical Activity: Not on file  Stress: Not on file  Social Connections: Unknown (07/03/2021)   [facility]   Social Network    Social Network: Not on file    Allergies:  Allergies  Allergen Reactions   Lisinopril Swelling   Shellfish Allergy     Objective:    Vital Signs:   Temp:  [97.7 F (36.5 C)-97.8 F (36.6 C)] 97.8 F (36.6 C) (08/28 0745) Pulse Rate:  [86-99] 97 (08/28 0828) Resp:  [17-20] 18 (08/28 0443) BP: (112-157)/(70-109) 127/98 (08/28 0828) SpO2:  [92 %-97 %] 96 % (08/28 0745) Weight:  [106.5 kg] 106.5 kg (08/28 0700) Last BM Date : 10/21/22  Weight change: Filed Weights   10/21/22 0700  Weight: 106.5 kg    Intake/Output:   Intake/Output Summary (Last 24 hours) at 10/21/2022 1615 Last data filed at 10/21/2022 0635 Gross per 24 hour  Intake 400 ml  Output 400 ml  Net 0 ml      Physical Exam    General:  Well appearing.  HEENT: normal Neck: supple. JVP to midneck . Carotids 2+ bilat; no bruits.  Cor: PMI nondisplaced. Regular rate & rhythm. No rubs, gallops or murmurs. Lungs: clear Abdomen: soft, nontender, nondistended.   Extremities: no cyanosis, clubbing, rash, edema, extremities are cool Neuro: alert & orientedx3. Affect pleasant   Telemetry   ST 100s-110s  EKG    SR 90 bpm, PVCs, LBBB with QRS 160 ms  Labs   Basic Metabolic Panel: Recent Labs  Lab 10/20/22 1222 10/20/22 1231 10/21/22 0538  NA 138 140 139  K 3.3* 3.4* 3.3*  CL 104 104 100  CO2 22  --  26  GLUCOSE 151* 149* 126*  BUN 14 15 12   CREATININE 1.18  1.10 1.12  CALCIUM 9.3  --  9.6    Liver Function Tests: Recent Labs  Lab 10/20/22 1222  AST 55*  ALT 69*  ALKPHOS 75  BILITOT 0.3  PROT 7.2  ALBUMIN 3.7   No results for input(s): "LIPASE", "AMYLASE" in the last 168 hours. No results for input(s): "AMMONIA" in the last 168 hours.  CBC: Recent Labs  Lab 10/20/22 1222 10/20/22 1231  WBC 8.1  --   NEUTROABS 6.1  --   HGB 12.2* 12.9*  HCT 36.4* 38.0*  MCV 87.5  --   PLT 243  --     Cardiac Enzymes: No results for input(s): "CKTOTAL", "CKMB", "CKMBINDEX", "TROPONINI" in the last 168 hours.  BNP: BNP (last 3 results) Recent Labs    10/20/22 1222 10/21/22 0538  BNP 366.0* 564.0*    ProBNP (last 3 results) No results for input(s): "PROBNP" in the last 8760 hours.   CBG: Recent Labs  Lab 10/20/22 1237  GLUCAP 120*    Coagulation Studies: Recent Labs    10/20/22 1222  LABPROT 14.9  INR 1.2     Imaging   ECHOCARDIOGRAM COMPLETE  Result Date: 10/21/2022    ECHOCARDIOGRAM REPORT   Patient Name:   GRIMM BASSANI Date of Exam: 10/21/2022 Medical Rec #:  [id number]       Height:       73.0 in Accession #:    [id number]      Weight:       234.8 lb Date of Birth:  11/06/1955       BSA:          2.303 m Patient Age:    66 years        BP:           127/98 mmHg Patient Gender: M               HR:           109 bpm. Exam Location:  Inpatient Procedure: 2D Echo, Intracardiac Opacification Agent, Cardiac Doppler and Color            Doppler  Results communicated to Dr Meriam Sprague at 1552 on 10/21/22. Indications:     Dyspnea R06.00  History:        Patient has no prior history of Echocardiogram examinations.                 CHF, Polysubstance Dependence, Signs/Symptoms:Dyspnea; Risk                 Factors:Hypertension and Former Smoker.  Sonographer:    Aron Baba Referring Phys: 3 DAWOOD S ELGERGAWY  Sonographer Comments: Technically difficult study due to poor echo windows. IMPRESSIONS  1. Left ventricular ejection fraction, by estimation, is <20%. The left ventricle has severely decreased function. The left ventricle demonstrates global hypokinesis. The left ventricular internal cavity size was moderately dilated. Left ventricular diastolic parameters are consistent with Grade I diastolic dysfunction (impaired relaxation).  2. Right ventricular systolic function is normal. The right ventricular size is normal. Tricuspid regurgitation signal is inadequate for assessing PA pressure.  3. Left atrial size was mildly dilated.  4. The mitral valve is normal in structure. Trivial mitral valve regurgitation.  5. The aortic valve is tricuspid. Aortic valve regurgitation is not visualized. No aortic stenosis is present.  6. The inferior vena cava is dilated in size with <50% respiratory variability, suggesting right atrial pressure of 15 mmHg. FINDINGS  Left Ventricle: Left ventricular ejection fraction,  by estimation, is <20%. The left ventricle has severely decreased function. The left ventricle demonstrates global hypokinesis. Definity contrast agent was given IV to delineate the left ventricular endocardial borders. The left ventricular internal cavity size was moderately dilated. There is no left ventricular hypertrophy. Left ventricular diastolic parameters are consistent with Grade I diastolic dysfunction (impaired relaxation). Right Ventricle: The right ventricular size is normal. No increase in right ventricular wall thickness. Right ventricular systolic function is normal. Tricuspid regurgitation signal is inadequate  for assessing PA pressure. The tricuspid regurgitant velocity is 0.78 m/s, and with an assumed right atrial pressure of 15 mmHg, the estimated right ventricular systolic pressure is 17.5 mmHg. Left Atrium: Left atrial size was mildly dilated. Right Atrium: Right atrial size was normal in size. Pericardium: Trivial pericardial effusion is present. Mitral Valve: The mitral valve is normal in structure. Trivial mitral valve regurgitation. Tricuspid Valve: The tricuspid valve is normal in structure. Tricuspid valve regurgitation is trivial. Aortic Valve: The aortic valve is tricuspid. Aortic valve regurgitation is not visualized. No aortic stenosis is present. Pulmonic Valve: The pulmonic valve was not well visualized. Pulmonic valve regurgitation is trivial. Aorta: The aortic root and ascending aorta are structurally normal, with no evidence of dilitation. Venous: The inferior vena cava is dilated in size with less than 50% respiratory variability, suggesting right atrial pressure of 15 mmHg. IAS/Shunts: The interatrial septum was not well visualized.  LEFT VENTRICLE PLAX 2D LVIDd:         6.60 cm   Diastology LVIDs:         6.40 cm   LV e' medial:    7.54 cm/s LV PW:         1.20 cm   LV E/e' medial:  7.1 LV IVS:        0.70 cm   LV e' lateral:   14.80 cm/s LVOT diam:     1.70 cm   LV E/e' lateral: 3.6 LV SV:         22 LV SV Index:   10 LVOT Area:     2.27 cm  RIGHT VENTRICLE RV S prime:     11.70 cm/s TAPSE (M-mode): 1.6 cm LEFT ATRIUM             Index        RIGHT ATRIUM           Index LA diam:        3.70 cm 1.61 cm/m   RA Area:     13.50 cm LA Vol (A2C):   87.1 ml 37.81 ml/m  RA Volume:   30.90 ml  13.41 ml/m LA Vol (A4C):   89.1 ml 38.68 ml/m LA Biplane Vol: 90.1 ml 39.12 ml/m  AORTIC VALVE             PULMONIC VALVE LVOT Vmax:   80.55 cm/s  PR End Diast Vel: 3.91 msec LVOT Vmean:  46.400 cm/s LVOT VTI:    0.099 m  AORTA Ao Root diam: 3.60 cm Ao Asc diam:  3.50 cm MITRAL VALVE               TRICUSPID  VALVE MV Area (PHT): 4.49 cm    TR Peak grad:   2.5 mmHg MV Decel Time: 169 msec    TR Vmax:        78.50 cm/s MR Peak grad: 3.8 mmHg MR Vmax:      97.90 cm/s   SHUNTS MV E velocity: 53.40 cm/s  Systemic VTI:  0.10 m MV A velocity: 81.70 cm/s  Systemic Diam: 1.70 cm MV E/A ratio:  0.65 Epifanio Lesches MD Electronically signed by Epifanio Lesches MD Signature Date/Time: 10/21/2022/3:54:56 PM    Final      Medications:     Current Medications:  amLODipine  10 mg Oral Daily   aspirin  81 mg Oral Daily   azithromycin  500 mg Oral Daily   DULoxetine  20 mg Oral Daily   enoxaparin (LOVENOX) injection  50 mg Subcutaneous QHS   furosemide  40 mg Intravenous Daily   gabapentin  300 mg Oral TID   pantoprazole  40 mg Oral Daily   sodium chloride flush  3 mL Intravenous Q12H    Infusions:  sodium chloride     cefTRIAXone (ROCEPHIN)  IV Stopped (10/20/22 1910)      Patient Profile   67 y.o. male with history of LBBB, HTN, GERD, DM, OSA on CPAP, iron deficiency anemia, THC and cocaine use. Admitted with acute systolic CHF and possible atypical PNA.   Assessment/Plan   Acute systolic CHF: -Echo today: EF < 20%, RV okay -Etiology not certain. ? LBBB +/- cocaine use. Has history of HTN but BP not markedly elevated.  -[facility] need [facility] - plan for tomorrow.  If no obstructive CAD, plan for cMRI -Still a little volume up. Will give one more dose of IV lasix this evening.  -Start losartan 25 mg daily. No ARNi or ACE with history of angioedema -Start spiro 12.5 mg daily -Start Jardiance 10 mg daily -If EF does not recover, may be a candidate for CRT-D if refrains from substance use   2. HTN -BP elevated here but states it has historically been controlled -Stop amlodipine -GDMT as above  3. Iron deficiency anemia -Hgb stable, 12.2 yesterday -Received IV iron in April -EGD 06/24 no significant abnormality -Colonoscopy 06/24 8 mm polyp but no other significant abnormality  4.  Hypokalemia -Supp today -Check mag in am  5. Substance abuse -Alcohol, THC and cocaine use -UDS + for THC and cocaine -Cessation discussed  6. OSA  -on CPAP   Length of Stay: 0  FINCH, LINDSAY N, PA-C  10/21/2022, 4:15 PM  Advanced Heart Failure Team Pager [phone] (M-F; 7a - 5p)  Please contact [facility] for night-coverage after hours (4p -7a ) and weekends on [email or web address]   Patient seen with PA, agree with the above note.   Patient has history of HTN, ETOH abuse, cocaine abuse. He was positive for cocaine this admission.  He reports only about 2 days of exertional dyspnea, no chest pain.  CTA chest with no PE, findings suggestive of bronchitis. Minimally elevated troponin with no trend. ECG shows NSR with wide LBBB (160 msec).   Echo showed EF < 20%, moderate LV dilation, RV normal, IVC dilated.   Patient has been diuresed with IV Lasix, breathing is better today (feels like baseline).   General: NAD Neck: JVP 8 cm, no thyromegaly or thyroid nodule.  Lungs: Clear to auscultation bilaterally with normal respiratory effort. CV: Nondisplaced PMI.  Heart regular S1/S2, +S3, no murmur.  No peripheral edema.  No carotid bruit.  Normal pedal pulses.  Abdomen: Soft, nontender, no hepatosplenomegaly, no distention.  Skin: Intact without lesions or rashes.  Neurologic: Alert and oriented x 3.  Psych: Normal affect. Extremities: No clubbing or cyanosis.  HEENT: Normal.   1. Acute systolic CHF: New diagnosis.  Admitted with CHF, echo this admission with EF < 20%,  moderate LV dilation, RV normal, IVC dilated.  No evidence for ACS, mildly elevated TnI with no trend is likely demand ischemia from volume overload.  Possible etiologies include substance abuse (cocaine/ETOH), long-standing HTN, ?myocarditis, ?coronary disease. Also has wide LBBB. No strong family history of CHF.  Breathing much better with diuresis, he does not look significantly volume overloaded on exam at this point.   - Lasix 40 mg IV x 1 more dose.  - Start losartan 25 mg daily (no ACEI or ARNI with angioedema on ACEI).  - Start spironolactone 12.5 daily - Start spironolactone 12.5 daily.  - Needs LHC/RHC to assess for coronary disease, arrange for tomorrow.  - If cath negative, would ideally get cardiac MRI but he has had trouble with claustrophobia in the past.  - With wide LBBB, may be CRT-D candidate in future if he cuts out substance abuse.  2. HTN: Meds as above.  3. OSA: Uses CPAP.  4. ETOH/cocaine abuse: Needs to quit, counseled that this could play a role in cardiomyopathy.   Marca Ancona 10/21/2022 5:50 PM

## 2022-10-21 NOTE — Consult Note (Addendum)
Advanced Heart Failure Team Consult Note   Primary Physician: Sherwood Gambler, MD PCP-Cardiologist:  [facility]  Reason for Consultation: Acute systolic CHF  HPI:    Maxwell Harris is seen today for evaluation of acute systolic CHF at the request of Dr. Meriam Sprague with [facility]. 67 y.o. male with history of LBBB (noted on ECGs in 2018), HTN, OSA on CPAP, iron deficiency anemia, GERD, DM, alcohol, THC and cocaine use. [name] most of his care through the [place].  Presented to ED 10/20/22 with complaints of shortness of breath, dizziness, and orthopnea for a couple of days. Labs significant for Scr 1.18, CO2 22, K 3.3, Na 138, ethanol negative, BNP 366, HS troponin 19>23, AST/ALT mildly elevated. ECG with sinus rhythm 90 bpm, PVCs, LBBB and QRS 160 ms. CTA chest negative for PE. There was b/l bronchial wall thickening w/ scattered ground glass airspace disease concerning for possible bronchopneumonia. He was given IV lasix and admitted to hospitalist service for acute CHF and respiratory failure requiring supplemental O2. Started on empiric abx for possible PNA. Respiratory panel negative. UDS + for cocaine and THC. Reported use 48 hrs prior.  On 40 mg IV lasix daily.   Echo today: LVEF < 20%, RV okay, dilated IVC with estimated RAP of 15  Advanced heart failure asked to see to assist with management of acute systolic CHF.   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 Has chronic pain in knees and hips from osteoarthritis and osteonecrosis of the hips.  Drinks 2-3 shots a day and chases with beer since 1990s. Uses cocaine occasionally, can't afford regular use.   No family history of CHF.   Home Medications Prior to Admission medications   Medication Sig Start Date End Date Taking? Authorizing Provider  acetaminophen (TYLENOL) 500 MG tablet Take 500 mg by mouth every 6 (six) hours as needed. joint   Yes [provider]  amLODipine (NORVASC) 5 MG tablet Take 1 tablet (5 mg total) by mouth daily.  06/12/20  Yes Wieters, Hallie C, PA-C  aspirin 81 MG chewable tablet Chew 81 mg by mouth daily.   Yes [provider]  cholecalciferol (VITAMIN D) 1000 UNITS tablet Take 1,000 Units by mouth daily.   Yes [provider]  cycloSPORINE (RESTASIS) 0.05 % ophthalmic emulsion Place 1 drop into both eyes 2 (two) times daily. 05/15/22  Yes [provider]  diclofenac Sodium (VOLTAREN) 1 % GEL Apply 2 g topically 4 (four) times daily as needed (pain). 03/03/22  Yes [provider]  DULoxetine (CYMBALTA) 20 MG capsule Take 20 mg by mouth daily.   Yes [provider]  famotidine (PEPCID) 20 MG tablet Take 1 tablet (20 mg total) by mouth daily. 09/12/19  Yes Yu, Amy V, PA-C  gabapentin (NEURONTIN) 300 MG capsule Take 300 mg by mouth 3 (three) times daily.   Yes [provider]  OMEPRAZOLE PO Take 20 mg by mouth daily.   Yes [provider]  sildenafil (VIAGRA) 100 MG tablet Take 100 mg by mouth as needed for erectile dysfunction. 09/15/22  Yes [provider]    Past Medical History: Past Medical History:  Diagnosis Date   Chronic GERD    Hypertension    Pain    Pre-diabetes     Past Surgical History: Past Surgical History:  Procedure Laterality Date   COLONOSCOPY     ESOPHAGOGASTRODUODENOSCOPY      Family History: Family History  Problem Relation Age of Onset  Stroke Mother    Other Father        patient was a kid when his Dad died, not sure of the cause   Colon cancer Neg Hx    Esophageal cancer Neg Hx     Social History: Social History   Socioeconomic History   Marital status: Single    Spouse name: Not on file   Number of children: 1   Years of education: Not on file   Highest education level: Not on file  Occupational History   Occupation: retired  Tobacco Use   Smoking status: Former    Types: Cigarettes   Smokeless tobacco: Never   Tobacco comments:    Not able to remember when he quit  Vaping Use    Vaping status: Never Used  Substance and Sexual Activity   Alcohol use: Yes    Alcohol/week: 1.0 standard drink of alcohol    Types: 1 Cans of beer per week    Comment: social   Drug use: Yes    Frequency: 1.0 times per week    Types: Marijuana   Sexual activity: Not Currently  Other Topics Concern   Not on file  Social History Narrative   Not on file   Social Determinants of Health   Financial Resource Strain: Not on file  Food Insecurity: No Food Insecurity (10/20/2022)   Hunger Vital Sign    Worried About Running Out of Food in the Last Year: Never true    Ran Out of Food in the Last Year: Never true  Transportation Needs: No Transportation Needs (10/20/2022)   PRAPARE - Administrator, Civil Service (Medical): No    Lack of Transportation (Non-Medical): No  Physical Activity: Not on file  Stress: Not on file  Social Connections: Unknown (07/03/2021)   [facility] from [facility]   Social Network    Social Network: Not on file    Allergies:  Allergies  Allergen Reactions   Lisinopril Swelling   Shellfish Allergy     Objective:    Vital Signs:   Temp:  [97.7 F (36.5 C)-97.8 F (36.6 C)] 97.8 F (36.6 C) (08/28 0745) Pulse Rate:  [86-99] 97 (08/28 0828) Resp:  [17-20] 18 (08/28 0443) BP: (112-157)/(70-109) 127/98 (08/28 0828) SpO2:  [92 %-97 %] 96 % (08/28 0745) Weight:  [106.5 kg] 106.5 kg (08/28 0700) Last BM Date : 10/21/22  Weight change: Filed Weights   10/21/22 0700  Weight: 106.5 kg    Intake/Output:   Intake/Output Summary (Last 24 hours) at 10/21/2022 1615 Last data filed at 10/21/2022 0635 Gross per 24 hour  Intake 400 ml  Output 400 ml  Net 0 ml      Physical Exam    General:  Well appearing.  HEENT: normal Neck: supple. JVP to midneck . Carotids 2+ bilat; no bruits.  Cor: PMI nondisplaced. Regular rate & rhythm. No rubs, gallops or murmurs. Lungs: clear Abdomen: soft, nontender, nondistended.   Extremities: no cyanosis, clubbing, rash, edema, extremities are cool Neuro: alert & orientedx3. Affect pleasant   Telemetry   ST 100s-110s  EKG    SR 90 bpm, PVCs, LBBB with QRS 160 ms  Labs   Basic Metabolic Panel: Recent Labs  Lab 10/20/22 1222 10/20/22 1231 10/21/22 0538  NA 138 140 139  K 3.3* 3.4* 3.3*  CL 104 104 100  CO2 22  --  26  GLUCOSE 151* 149* 126*  BUN 14 15 12   CREATININE 1.18  1.10 1.12  CALCIUM 9.3  --  9.6    Liver Function Tests: Recent Labs  Lab 10/20/22 1222  AST 55*  ALT 69*  ALKPHOS 75  BILITOT 0.3  PROT 7.2  ALBUMIN 3.7   No results for input(s): "LIPASE", "AMYLASE" in the last 168 hours. No results for input(s): "AMMONIA" in the last 168 hours.  CBC: Recent Labs  Lab 10/20/22 1222 10/20/22 1231  WBC 8.1  --   NEUTROABS 6.1  --   HGB 12.2* 12.9*  HCT 36.4* 38.0*  MCV 87.5  --   PLT 243  --     Cardiac Enzymes: No results for input(s): "CKTOTAL", "CKMB", "CKMBINDEX", "TROPONINI" in the last 168 hours.  BNP: BNP (last 3 results) Recent Labs    10/20/22 1222 10/21/22 0538  BNP 366.0* 564.0*    ProBNP (last 3 results) No results for input(s): "PROBNP" in the last 8760 hours.   CBG: Recent Labs  Lab 10/20/22 1237  GLUCAP 120*    Coagulation Studies: Recent Labs    10/20/22 1222  LABPROT 14.9  INR 1.2     Imaging   ECHOCARDIOGRAM COMPLETE  Result Date: 10/21/2022    ECHOCARDIOGRAM REPORT   Patient Name:   GRIMM BASSANI Date of Exam: 10/21/2022 Medical Rec #:  [id number]       Height:       73.0 in Accession #:    [id number]      Weight:       234.8 lb Date of Birth:  11/06/1955       BSA:          2.303 m Patient Age:    66 years        BP:           127/98 mmHg Patient Gender: M               HR:           109 bpm. Exam Location:  Inpatient Procedure: 2D Echo, Intracardiac Opacification Agent, Cardiac Doppler and Color            Doppler  Results communicated to Dr Meriam Sprague at 1552 on 10/21/22. Indications:     Dyspnea R06.00  History:        Patient has no prior history of Echocardiogram examinations.                 CHF, Polysubstance Dependence, Signs/Symptoms:Dyspnea; Risk                 Factors:Hypertension and Former Smoker.  Sonographer:    Aron Baba Referring Phys: 3 DAWOOD S ELGERGAWY  Sonographer Comments: Technically difficult study due to poor echo windows. IMPRESSIONS  1. Left ventricular ejection fraction, by estimation, is <20%. The left ventricle has severely decreased function. The left ventricle demonstrates global hypokinesis. The left ventricular internal cavity size was moderately dilated. Left ventricular diastolic parameters are consistent with Grade I diastolic dysfunction (impaired relaxation).  2. Right ventricular systolic function is normal. The right ventricular size is normal. Tricuspid regurgitation signal is inadequate for assessing PA pressure.  3. Left atrial size was mildly dilated.  4. The mitral valve is normal in structure. Trivial mitral valve regurgitation.  5. The aortic valve is tricuspid. Aortic valve regurgitation is not visualized. No aortic stenosis is present.  6. The inferior vena cava is dilated in size with <50% respiratory variability, suggesting right atrial pressure of 15 mmHg. FINDINGS  Left Ventricle: Left ventricular ejection fraction,  by estimation, is <20%. The left ventricle has severely decreased function. The left ventricle demonstrates global hypokinesis. Definity contrast agent was given IV to delineate the left ventricular endocardial borders. The left ventricular internal cavity size was moderately dilated. There is no left ventricular hypertrophy. Left ventricular diastolic parameters are consistent with Grade I diastolic dysfunction (impaired relaxation). Right Ventricle: The right ventricular size is normal. No increase in right ventricular wall thickness. Right ventricular systolic function is normal. Tricuspid regurgitation signal is inadequate  for assessing PA pressure. The tricuspid regurgitant velocity is 0.78 m/s, and with an assumed right atrial pressure of 15 mmHg, the estimated right ventricular systolic pressure is 17.5 mmHg. Left Atrium: Left atrial size was mildly dilated. Right Atrium: Right atrial size was normal in size. Pericardium: Trivial pericardial effusion is present. Mitral Valve: The mitral valve is normal in structure. Trivial mitral valve regurgitation. Tricuspid Valve: The tricuspid valve is normal in structure. Tricuspid valve regurgitation is trivial. Aortic Valve: The aortic valve is tricuspid. Aortic valve regurgitation is not visualized. No aortic stenosis is present. Pulmonic Valve: The pulmonic valve was not well visualized. Pulmonic valve regurgitation is trivial. Aorta: The aortic root and ascending aorta are structurally normal, with no evidence of dilitation. Venous: The inferior vena cava is dilated in size with less than 50% respiratory variability, suggesting right atrial pressure of 15 mmHg. IAS/Shunts: The interatrial septum was not well visualized.  LEFT VENTRICLE PLAX 2D LVIDd:         6.60 cm   Diastology LVIDs:         6.40 cm   LV e' medial:    7.54 cm/s LV PW:         1.20 cm   LV E/e' medial:  7.1 LV IVS:        0.70 cm   LV e' lateral:   14.80 cm/s LVOT diam:     1.70 cm   LV E/e' lateral: 3.6 LV SV:         22 LV SV Index:   10 LVOT Area:     2.27 cm  RIGHT VENTRICLE RV S prime:     11.70 cm/s TAPSE (M-mode): 1.6 cm LEFT ATRIUM             Index        RIGHT ATRIUM           Index LA diam:        3.70 cm 1.61 cm/m   RA Area:     13.50 cm LA Vol (A2C):   87.1 ml 37.81 ml/m  RA Volume:   30.90 ml  13.41 ml/m LA Vol (A4C):   89.1 ml 38.68 ml/m LA Biplane Vol: 90.1 ml 39.12 ml/m  AORTIC VALVE             PULMONIC VALVE LVOT Vmax:   80.55 cm/s  PR End Diast Vel: 3.91 msec LVOT Vmean:  46.400 cm/s LVOT VTI:    0.099 m  AORTA Ao Root diam: 3.60 cm Ao Asc diam:  3.50 cm MITRAL VALVE               TRICUSPID  VALVE MV Area (PHT): 4.49 cm    TR Peak grad:   2.5 mmHg MV Decel Time: 169 msec    TR Vmax:        78.50 cm/s MR Peak grad: 3.8 mmHg MR Vmax:      97.90 cm/s   SHUNTS MV E velocity: 53.40 cm/s  Systemic VTI:  0.10 m MV A velocity: 81.70 cm/s  Systemic Diam: 1.70 cm MV E/A ratio:  0.65 Epifanio Lesches MD Electronically signed by Epifanio Lesches MD Signature Date/Time: 10/21/2022/3:54:56 PM    Final      Medications:     Current Medications:  amLODipine  10 mg Oral Daily   aspirin  81 mg Oral Daily   azithromycin  500 mg Oral Daily   DULoxetine  20 mg Oral Daily   enoxaparin (LOVENOX) injection  50 mg Subcutaneous QHS   furosemide  40 mg Intravenous Daily   gabapentin  300 mg Oral TID   pantoprazole  40 mg Oral Daily   sodium chloride flush  3 mL Intravenous Q12H    Infusions:  sodium chloride     cefTRIAXone (ROCEPHIN)  IV Stopped (10/20/22 1910)      Patient Profile   67 y.o. male with history of LBBB, HTN, GERD, DM, OSA on CPAP, iron deficiency anemia, THC and cocaine use. Admitted with acute systolic CHF and possible atypical PNA.   Assessment/Plan   Acute systolic CHF: -Echo today: EF < 20%, RV okay -Etiology not certain. ? LBBB +/- cocaine use. Has history of HTN but BP not markedly elevated.  -Will need [facility] - plan for tomorrow.  If no obstructive CAD, plan for cMRI -Still a little volume up. Will give one more dose of IV lasix this evening.  -Start losartan 25 mg daily. No ARNi or ACE with history of angioedema -Start spiro 12.5 mg daily -Start Jardiance 10 mg daily -If EF does not recover, may be a candidate for CRT-D if refrains from substance use   2. HTN -BP elevated here but states it has historically been controlled -Stop amlodipine -GDMT as above  3. Iron deficiency anemia -Hgb stable, 12.2 yesterday -Received IV iron in April -EGD 06/24 no significant abnormality -Colonoscopy 06/24 8 mm polyp but no other significant abnormality  4.  Hypokalemia -Supp today -Check mag in am  5. Substance abuse -Alcohol, THC and cocaine use -UDS + for THC and cocaine -Cessation discussed  6. OSA  -on CPAP   Length of Stay: 0  FINCH, LINDSAY N, PA-C  10/21/2022, 4:15 PM  Advanced Heart Failure Team Pager [phone] (M-F; 7a - 5p)  Please contact [facility] for night-coverage after hours (4p -7a ) and weekends on [email or web address]   Patient seen with PA, agree with the above note.   Patient has history of HTN, ETOH abuse, cocaine abuse. He was positive for cocaine this admission.  He reports only about 2 days of exertional dyspnea, no chest pain.  CTA chest with no PE, findings suggestive of bronchitis. Minimally elevated troponin with no trend. ECG shows NSR with wide LBBB (160 msec).   Echo showed EF < 20%, moderate LV dilation, RV normal, IVC dilated.   Patient has been diuresed with IV Lasix, breathing is better today (feels like baseline).   General: NAD Neck: JVP 8 cm, no thyromegaly or thyroid nodule.  Lungs: Clear to auscultation bilaterally with normal respiratory effort. CV: Nondisplaced PMI.  Heart regular S1/S2, +S3, no murmur.  No peripheral edema.  No carotid bruit.  Normal pedal pulses.  Abdomen: Soft, nontender, no hepatosplenomegaly, no distention.  Skin: Intact without lesions or rashes.  Neurologic: Alert and oriented x 3.  Psych: Normal affect. Extremities: No clubbing or cyanosis.  HEENT: Normal.   1. Acute systolic CHF: New diagnosis.  Admitted with CHF, echo this admission with EF < 20%,  moderate LV dilation, RV normal, IVC dilated.  No evidence for ACS, mildly elevated TnI with no trend is likely demand ischemia from volume overload.  Possible etiologies include substance abuse (cocaine/ETOH), long-standing HTN, ?myocarditis, ?coronary disease. Also has wide LBBB. No strong family history of CHF.  Breathing much better with diuresis, he does not look significantly volume overloaded on exam at this point.   - Lasix 40 mg IV x 1 more dose.  - Start losartan 25 mg daily (no ACEI or ARNI with angioedema on ACEI).  - Start spironolactone 12.5 daily - Start spironolactone 12.5 daily.  - Needs LHC/RHC to assess for coronary disease, arrange for tomorrow.  - If cath negative, would ideally get cardiac MRI but he has had trouble with claustrophobia in the past.  - With wide LBBB, may be CRT-D candidate in future if he cuts out substance abuse.  2. HTN: Meds as above.  3. OSA: Uses CPAP.  4. ETOH/cocaine abuse: Needs to quit, counseled that this could play a role in cardiomyopathy.   Marca Ancona 10/21/2022 5:50 PM

## 2022-10-21 NOTE — Progress Notes (Signed)
Notified MD of STE in 2nd lead. PT is asymptomatic. New  orders  are put in.

## 2022-10-22 ENCOUNTER — Other Ambulatory Visit (HOSPITAL_COMMUNITY): Payer: Self-pay

## 2022-10-22 ENCOUNTER — Encounter (HOSPITAL_COMMUNITY)
Admission: EM | Disposition: A | Discharge status: Home / Self Care | Payer: Self-pay | Source: Home / Self Care | Attending: Internal Medicine

## 2022-10-22 ENCOUNTER — Encounter: Payer: Self-pay | Admitting: Physician Assistant

## 2022-10-22 DIAGNOSIS — I5021 Acute systolic (congestive) heart failure: Secondary | ICD-10-CM

## 2022-10-22 DIAGNOSIS — R0602 Shortness of breath: Secondary | ICD-10-CM | POA: Diagnosis not present

## 2022-10-22 HISTORY — PX: RIGHT/LEFT HEART CATH AND CORONARY ANGIOGRAPHY: CATH118266

## 2022-10-22 LAB — BASIC METABOLIC PANEL
Anion gap: 14 (ref 5–15)
BUN: 16 mg/dL (ref 8–23)
CO2: 24 mmol/L (ref 22–32)
Calcium: 9.5 mg/dL (ref 8.9–10.3)
Chloride: 98 mmol/L (ref 98–111)
Creatinine, Ser: 1.4 mg/dL — ABNORMAL HIGH (ref 0.61–1.24)
GFR, Estimated: 55 mL/min — ABNORMAL LOW (ref >60)
Glucose, Bld: 126 mg/dL — ABNORMAL HIGH (ref 70–99)
Potassium: 3.9 mmol/L (ref 3.5–5.1)
Sodium: 136 mmol/L (ref 135–145)

## 2022-10-22 LAB — POCT I-STAT EG7
Acid-Base Excess: 1 mmol/L (ref 0.0–2.0)
Acid-Base Excess: 1 mmol/L (ref 0.0–2.0)
Bicarbonate: 26.1 mmol/L (ref 20.0–28.0)
Bicarbonate: 26.3 mmol/L (ref 20.0–28.0)
Calcium, Ion: 1.21 mmol/L (ref 1.15–1.40)
Calcium, Ion: 1.23 mmol/L (ref 1.15–1.40)
HCT: 42 % (ref 39.0–52.0)
HCT: 43 % (ref 39.0–52.0)
Hemoglobin: 14.3 g/dL (ref 13.0–17.0)
Hemoglobin: 14.6 g/dL (ref 13.0–17.0)
O2 Saturation: 57 %
O2 Saturation: 58 %
Potassium: 3.7 mmol/L (ref 3.5–5.1)
Potassium: 3.8 mmol/L (ref 3.5–5.1)
Sodium: 140 mmol/L (ref 135–145)
Sodium: 141 mmol/L (ref 135–145)
TCO2: 27 mmol/L (ref 22–32)
TCO2: 28 mmol/L (ref 22–32)
pCO2, Ven: 44.2 mmHg (ref 44–60)
pCO2, Ven: 44.4 mmHg (ref 44–60)
pH, Ven: 7.379 (ref 7.25–7.43)
pH, Ven: 7.38 (ref 7.25–7.43)
pO2, Ven: 31 mmHg — CL (ref 32–45)
pO2, Ven: 31 mmHg — CL (ref 32–45)

## 2022-10-22 LAB — POCT I-STAT 7, (LYTES, BLD GAS, ICA,H+H)
Acid-base deficit: 2 mmol/L (ref 0.0–2.0)
Bicarbonate: 22.8 mmol/L (ref 20.0–28.0)
Calcium, Ion: 1.11 mmol/L — ABNORMAL LOW (ref 1.15–1.40)
HCT: 40 % (ref 39.0–52.0)
Hemoglobin: 13.6 g/dL (ref 13.0–17.0)
O2 Saturation: 91 %
Potassium: 3.5 mmol/L (ref 3.5–5.1)
Sodium: 142 mmol/L (ref 135–145)
TCO2: 24 mmol/L (ref 22–32)
pCO2 arterial: 37.8 mmHg (ref 32–48)
pH, Arterial: 7.389 (ref 7.35–7.45)
pO2, Arterial: 63 mmHg — ABNORMAL LOW (ref 83–108)

## 2022-10-22 LAB — TSH: TSH: 5.438 u[IU]/mL — ABNORMAL HIGH (ref 0.350–4.500)

## 2022-10-22 LAB — CBC
HCT: 43.1 % (ref 39.0–52.0)
Hemoglobin: 14.4 g/dL (ref 13.0–17.0)
MCH: 28.9 pg (ref 26.0–34.0)
MCHC: 33.4 g/dL (ref 30.0–36.0)
MCV: 86.5 fL (ref 80.0–100.0)
Platelets: 282 10*3/uL (ref 150–400)
RBC: 4.98 MIL/uL (ref 4.22–5.81)
RDW: 13.5 % (ref 11.5–15.5)
WBC: 6.8 10*3/uL (ref 4.0–10.5)
nRBC: 0 % (ref 0.0–0.2)

## 2022-10-22 LAB — MAGNESIUM: Magnesium: 2 mg/dL (ref 1.7–2.4)

## 2022-10-22 LAB — HEMOGLOBIN A1C
Hgb A1c MFr Bld: 6.1 % — ABNORMAL HIGH (ref 4.8–5.6)
Mean Plasma Glucose: 128.37 mg/dL

## 2022-10-22 LAB — LEGIONELLA PNEUMOPHILA SEROGP 1 UR AG: L. pneumophila Serogp 1 Ur Ag: NEGATIVE

## 2022-10-22 SURGERY — RIGHT/LEFT HEART CATH AND CORONARY ANGIOGRAPHY
Anesthesia: LOCAL

## 2022-10-22 MED ORDER — FUROSEMIDE 20 MG PO TABS
20.0000 mg | ORAL_TABLET | Freq: Every day | ORAL | Status: DC
  Administered 2022-10-22 – 2022-10-23 (×2): 20 mg via ORAL
  Filled 2022-10-22 (×2): qty 1

## 2022-10-22 MED ORDER — LABETALOL HCL 5 MG/ML IV SOLN: 10.0000 mg | INTRAVENOUS | Status: AC | PRN

## 2022-10-22 MED ORDER — MIDAZOLAM HCL 2 MG/2ML IJ SOLN
INTRAMUSCULAR | Status: AC
  Filled 2022-10-22: qty 2

## 2022-10-22 MED ORDER — SODIUM CHLORIDE 0.9% FLUSH: 3.0000 mL | INTRAVENOUS | Status: DC | PRN

## 2022-10-22 MED ORDER — FENTANYL CITRATE (PF) 100 MCG/2ML IJ SOLN
INTRAMUSCULAR | Status: DC | PRN
  Administered 2022-10-22 (×2): 25 ug via INTRAVENOUS

## 2022-10-22 MED ORDER — SODIUM CHLORIDE 0.9% FLUSH
3.0000 mL | Freq: Two times a day (BID) | INTRAVENOUS | Status: DC
  Administered 2022-10-22 – 2022-10-23 (×2): 3 mL via INTRAVENOUS

## 2022-10-22 MED ORDER — FENTANYL CITRATE (PF) 100 MCG/2ML IJ SOLN
INTRAMUSCULAR | Status: AC
  Filled 2022-10-22: qty 2

## 2022-10-22 MED ORDER — ACETAMINOPHEN 325 MG PO TABS
650.0000 mg | ORAL_TABLET | ORAL | Status: DC | PRN
  Administered 2022-10-22: 650 mg via ORAL

## 2022-10-22 MED ORDER — HEPARIN (PORCINE) IN NACL 1000-0.9 UT/500ML-% IV SOLN
INTRAVENOUS | Status: DC | PRN
  Administered 2022-10-22 (×2): 500 mL

## 2022-10-22 MED ORDER — MIDAZOLAM HCL 2 MG/2ML IJ SOLN
INTRAMUSCULAR | Status: DC | PRN
  Administered 2022-10-22 (×2): 1 mg via INTRAVENOUS

## 2022-10-22 MED ORDER — SPIRONOLACTONE 25 MG PO TABS
25.0000 mg | ORAL_TABLET | Freq: Every day | ORAL | Status: DC
  Administered 2022-10-22 – 2022-10-23 (×2): 25 mg via ORAL
  Filled 2022-10-22 (×2): qty 1

## 2022-10-22 MED ORDER — LIDOCAINE HCL (PF) 1 % IJ SOLN
INTRAMUSCULAR | Status: DC | PRN
  Administered 2022-10-22 (×2): 2 mL via INTRADERMAL

## 2022-10-22 MED ORDER — IOHEXOL 350 MG/ML SOLN
INTRAVENOUS | Status: DC | PRN
  Administered 2022-10-22: 40 mL via INTRA_ARTERIAL

## 2022-10-22 MED ORDER — HEPARIN SODIUM (PORCINE) 1000 UNIT/ML IJ SOLN
INTRAMUSCULAR | Status: DC | PRN
  Administered 2022-10-22: 5000 [IU] via INTRAVENOUS

## 2022-10-22 MED ORDER — HEPARIN SODIUM (PORCINE) 1000 UNIT/ML IJ SOLN
INTRAMUSCULAR | Status: AC
  Filled 2022-10-22: qty 10

## 2022-10-22 MED ORDER — VERAPAMIL HCL 2.5 MG/ML IV SOLN
INTRAVENOUS | Status: AC
  Filled 2022-10-22: qty 2

## 2022-10-22 MED ORDER — HYDRALAZINE HCL 20 MG/ML IJ SOLN: 10.0000 mg | INTRAMUSCULAR | Status: AC | PRN

## 2022-10-22 MED ORDER — SODIUM CHLORIDE 0.9 % IV SOLN: 250.0000 mL | INTRAVENOUS | Status: DC | PRN

## 2022-10-22 MED ORDER — ONDANSETRON HCL 4 MG/2ML IJ SOLN: 4.0000 mg | Freq: Four times a day (QID) | INTRAMUSCULAR | Status: DC | PRN

## 2022-10-22 MED ORDER — ENOXAPARIN SODIUM 40 MG/0.4ML IJ SOSY
40.0000 mg | PREFILLED_SYRINGE | INTRAMUSCULAR | Status: DC
  Administered 2022-10-23: 40 mg via SUBCUTANEOUS
  Filled 2022-10-22: qty 0.4

## 2022-10-22 MED ORDER — LIDOCAINE HCL (PF) 1 % IJ SOLN
INTRAMUSCULAR | Status: AC
  Filled 2022-10-22: qty 30

## 2022-10-22 MED ORDER — VERAPAMIL HCL 2.5 MG/ML IV SOLN
INTRAVENOUS | Status: DC | PRN
  Administered 2022-10-22: 10 mL via INTRA_ARTERIAL

## 2022-10-22 SURGICAL SUPPLY — 12 items
CATH 5FR JL3.5 JR4 ANG PIG MP (CATHETERS) IMPLANT
CATH BALLN WEDGE 5F 110CM (CATHETERS) IMPLANT
DEVICE RAD COMP TR BAND LRG (VASCULAR PRODUCTS) IMPLANT
GLIDESHEATH SLEND SS 6F .021 (SHEATH) IMPLANT
GUIDEWIRE .025 260CM (WIRE) IMPLANT
GUIDEWIRE INQWIRE 1.5J.035X260 (WIRE) IMPLANT
INQWIRE 1.5J .035X260CM (WIRE) ×1
KIT SYRINGE INJ CVI SPIKEX1 (MISCELLANEOUS) IMPLANT
PACK CARDIAC CATHETERIZATION (CUSTOM PROCEDURE TRAY) ×1 IMPLANT
SET ATX-X65L (MISCELLANEOUS) IMPLANT
SHEATH GLIDE SLENDER 4/5FR (SHEATH) IMPLANT
WIRE EMERALD 3MM-J .025X260CM (WIRE) IMPLANT

## 2022-10-22 NOTE — Progress Notes (Signed)
Progress Note   Patient: Maxwell Harris UJW:[id number] DOB: 1955-09-16 DOA: 10/20/2022     1 DOS: the patient was seen and examined on 10/22/2022   Subjective:  Patient seen and examined at bedside this afternoon Denies nausea vomiting abdominal pain chest pain Patient underwent cardiac catheterization today that was essentially normal   Brief hospital course: From HPI "Asahi Soyars  is a 67 y.o. male, with past medical history for hypertension, GERD, prediabetes, neuropathic pain, presents to ED secondary to complaints of shortness of breath, patient reports dyspnea, wheezing, orthopnea at nighttime, he denies any history of tobacco use or abuse, but reports smoking THC, and snorting cocaine occasionally) most recent 48 hours ago), asthma, COPD, and history of coronary artery disease, denies any chest pain, reports symptoms has been progressive over the last 3 days, no nausea, no vomiting, no fever, no chills, no sick contact -NAD workup significant for potassium of 3.4, EKG significant for old left bundle branch block, CTA chest negative for PE, but significant for bilateral bronchial wall thickening, with scattered groundglass airspace disease and trace bilateral effusion, consistent with multifocal bronchopneumonia bronchitis, cardiomegaly with prominent left ventricle dilation, ENP elevated at 366, patient received IV Lasix and [facility] hospitalist consulted to admit.  "   Assessment and Plan: Principal Problem:   Dyspnea Active Problems:   Essential hypertension   Acute CHF (HCC)   Polysubstance (excluding opioids) dependence (HCC)       Dyspnea is likely due to acute congestive heart failure (unspecified) Possible community-acquired pneumonia -Patient presents with dyspnea, CTA chest negative for PE however with concerns of atypical infection versus fluid overload  Respiratory panel results came back negative  -Patient presented with elevated BNP, elevated CVP and lower extremity  edema Continue telemetry monitoring on Lasix 40 mg IV daily We will continue IV Lasix for now Follow-up on echocardiogram report to rule out heart failure -EKG with left bundle branch block, appears to be old on previous EKG in 2018 Denies any chest pains -Continue IV Rocephin and azithromycin, check Legionella antigen, pneumonia antigen -Encouraged use incentive spirometry and flutter valve   Acute systolic congestive heart failure Patient with newly diagnosed acute systolic congestive heart failure Echocardiogram obtained this hospitalization showed EF less than 20% with moderate left ventricular dilatation Patient with no evidence of ACS Patient is s/p cardiac catheterization today by cardiologist Patient have been placed on Lasix 20 mg p.o. daily Also started on losartan 25 mg daily, spironolactone 25 mg daily Jardiance 10 mg daily Cardiologist on board and managing patient's medications we appreciate input Patient counseled on alcohol cessation as well as avoidance of drug use Patient will have to follow-up with heart failure clinic Monitor input and output closely as well as daily weights  Hypertension Norvasc discontinued Continue above heart failure medications   Substance abuse -Patient endorse smoking THC and snorting cocaine -He was counseled   Obesity BMI of 31 Nonsudden weight loss with diet and exercise when medically stable   Neuropathic pain Continue with gabapentin   Hypokalemia -Repleted   GERD-continue PPI therapy   DVT Prophylaxis continue Lovenox     Family Communication: No family present at bedside at this time   Code Status full   Physical Exam: General: Elderly male in no acute distress HEENT: Atraumatic normocephalic CVS: S1-S2 present no murmur, basal crackles noted Resp respiratory: Basilar crackles noted normal breath sounds Musculoskeletal patient with trace peripheral edema CNS: Alert and awake oriented moving all extremities  equally  Data Reviewed: I  have reviewed the patient's CBC as well as BMP, cardiology documentation, cardiac catheterization report, telemetry reading  Vitals:   10/22/22 1115 10/22/22 1130 10/22/22 1215 10/22/22 1334  BP: 114/77 99/87 125/84 116/85  Pulse: 79 87 81   Resp: 17 (!) 23 17   Temp:    (!) 97.5 F (36.4 C)  TempSrc:    Axillary  SpO2: 97% 98% 96%   Weight:      Height:          Latest Ref Rng & Units 10/22/2022    2:01 AM 10/20/2022   12:31 PM 10/20/2022   12:22 PM  CBC  WBC 4.0 - 10.5 K/uL 6.8   8.1   Hemoglobin 13.0 - 17.0 g/dL 16.1  09.6  04.5   Hematocrit 39.0 - 52.0 % 43.1  38.0  36.4   Platelets 150 - 400 K/uL 282   243        Latest Ref Rng & Units 10/22/2022    2:01 AM 10/21/2022    5:38 AM 10/20/2022   12:31 PM  BMP  Glucose 70 - 99 mg/dL 409  811  914   BUN 8 - 23 mg/dL 16  12  15    Creatinine 0.61 - 1.24 mg/dL 7.82  9.56  2.13   Sodium 135 - 145 mmol/L 136  139  140   Potassium 3.5 - 5.1 mmol/L 3.9  3.3  3.4   Chloride 98 - 111 mmol/L 98  100  104   CO2 22 - 32 mmol/L 24  26    Calcium 8.9 - 10.3 mg/dL 9.5  9.6      Author: Loyce Dys, MD 10/22/2022 4:31 PM  For on call review [email or web address].

## 2022-10-22 NOTE — Plan of Care (Signed)

## 2022-10-22 NOTE — TOC Benefit Eligibility Note (Signed)
Patient Product/process development scientist completed.    The patient is insured through [facility]. Patient has Medicare and is not eligible for a copay card, but may be able to apply for patient assistance, if available.    Ran test claim for Jardiance 10 mg and the current 30 day co-pay is $47.00.   This test claim was processed through [facility]- copay amounts may vary at other pharmacies due to pharmacy/plan contracts, or as the patient moves through the different stages of their insurance plan.     Roland Earl, CPHT Pharmacy Technician III Certified Patient Advocate [facility] Patient Advocate Team Direct Number: [phone]  Fax: [phone]

## 2022-10-22 NOTE — Progress Notes (Signed)
Heart Failure Navigator Progress Note  Assessed for Heart & Vascular TOC clinic readiness.  Patient does not meet criteria due to the Advanced Heart Failure team getting consulted.   Navigator will sign off at this time.    Agostino Gorin,RN, BSN,MSN Heart Failure Nurse Navigator. Contact by secure chat only.

## 2022-10-22 NOTE — Interval H&P Note (Signed)
History and Physical Interval Note:  10/22/2022 8:02 AM  Maxwell Harris  has presented today for surgery, with the diagnosis of heart failure.  The various methods of treatment have been discussed with the patient and family. After consideration of risks, benefits and other options for treatment, the patient has consented to  Procedure(s): RIGHT/LEFT HEART CATH AND CORONARY ANGIOGRAPHY (N/A) as a surgical intervention.  The patient's history has been reviewed, patient examined, no change in status, stable for surgery.  I have reviewed the patient's chart and labs.  Questions were answered to the patient's satisfaction.     Cyndy Braver [employer]

## 2022-10-22 NOTE — TOC Initial Note (Signed)
Transition of Care [facility]) - Initial/Assessment Note    Patient Details  Name: Maxwell Harris MRN: [id number] Date of Birth: January 25, 1956  Transition of Care [facility]) CM/SW Contact:    Nicanor Bake Phone Number: [phone] 10/22/2022, 2:33 PM  Clinical Narrative:   HF CSW met with pt at bedside. CSW built rapport with pt. Pt stated that he lives alone. Pt stated that he has no history of HH services. Pt stated that he has no scale at home. Pt stated that he uses a cane and chair lift in his home. CSW explained to the pt that a follow up hospital appointment will be scheduled closer to dc. TOC will continue following.    Expected Discharge Plan: Home/Self Care Barriers to Discharge: Continued Medical Work up   Patient Goals and CMS Choice Patient states their goals for this hospitalization and ongoing recovery are:: return home          Expected Discharge Plan and Services       Living arrangements for the past 2 months: Single Family Home                                      Prior Living Arrangements/Services Living arrangements for the past 2 months: Single Family Home Lives with:: Self Patient language and need for interpreter reviewed:: Yes Do you feel safe going back to the place where you live?: Yes      Need for Family Participation in Patient Care: Yes (Comment) Care giver support system in place?: Yes (comment)   Criminal Activity/Legal Involvement Pertinent to Current Situation/Hospitalization: No - Comment as needed  Activities of Daily Living Home Assistive Devices/Equipment: None ADL Screening (condition at time of admission) Patient's cognitive ability adequate to safely complete daily activities?: Yes Is the patient deaf or have difficulty hearing?: No Does the patient have difficulty seeing, even when wearing glasses/contacts?: No Does the patient have difficulty concentrating, remembering, or making decisions?: No Patient able to express  need for assistance with ADLs?: Yes Does the patient have difficulty dressing or bathing?: No Independently performs ADLs?: Yes (appropriate for developmental age) Does the patient have difficulty walking or climbing stairs?: No Weakness of Legs: None Weakness of Arms/Hands: None  Permission Sought/Granted                  Emotional Assessment Appearance:: Appears stated age Attitude/Demeanor/Rapport: Engaged Affect (typically observed): Appropriate Orientation: : Oriented to Self, Oriented to Place, Oriented to  Time, Oriented to Situation Alcohol / Substance Use: Not Applicable Psych Involvement: No (comment)  Admission diagnosis:  Dyspnea [R06.00] Patient Active Problem List   Diagnosis Date Noted   Dyspnea 10/20/2022   Essential hypertension 10/20/2022   Acute CHF (HCC) 10/20/2022   Polysubstance (excluding opioids) dependence (HCC) 10/20/2022   Iron deficiency anemia 03/27/2022   PCP:  Sherwood Gambler, MD Pharmacy:   [id number] - [place], [place] - [address] [place] [address] Phone: [phone] Fax: [phone]     Social Determinants of Health (SDOH) Social History: SDOH Screenings   Food Insecurity: No Food Insecurity (10/20/2022)  Housing: Low Risk  (10/20/2022)  Transportation Needs: No Transportation Needs (10/20/2022)  Utilities: Not At Risk (10/20/2022)  Social Connections: Unknown (07/03/2021)   Received from [facility]  Tobacco Use: Medium Risk (10/21/2022)   SDOH Interventions:     Readmission Risk Interventions  No data to display

## 2022-10-22 NOTE — Progress Notes (Signed)
Advanced Heart Failure Rounding Note  PCP-Cardiologist: None   Subjective:    No complaints this morning, breathing better.  Weight has trended down with IV Lasix.   LHC/RHC today: No significant coronary disease.  RA mean 6 PA 34/17 PCWP mean 15 CI 2  Objective:   Weight Range: 101.6 kg Body mass index is 29.56 kg/m.   Vital Signs:   Temp:  [97.5 F (36.4 C)-98.4 F (36.9 C)] 97.5 F (36.4 C) (08/29 0339) Pulse Rate:  [63-101] 97 (08/29 0339) BP: (108-128)/(59-99) 108/88 (08/29 0339) SpO2:  [96 %-99 %] 98 % (08/29 0757) Weight:  [101.6 kg-102 kg] 101.6 kg (08/29 0500) Last BM Date : 10/21/22  Weight change: Filed Weights   10/21/22 0700 10/21/22 2300 10/22/22 0500  Weight: 106.5 kg 102 kg 101.6 kg    Intake/Output:  No intake or output data in the 24 hours ending 10/22/22 0834    Physical Exam    General:  Well appearing. No resp difficulty HEENT: Normal Neck: Supple. JVP not elevated. Carotids 2+ bilat; no bruits. No lymphadenopathy or thyromegaly appreciated. Cor: PMI nondisplaced. Regular rate & rhythm. No rubs, gallops or murmurs. Lungs: Clear Abdomen: Soft, nontender, nondistended. No hepatosplenomegaly. No bruits or masses. Good bowel sounds. Extremities: No cyanosis, clubbing, rash, edema Neuro: Alert & orientedx3, cranial nerves grossly intact. moves all 4 extremities w/o difficulty. Affect pleasant   Telemetry   NSR 90s (personally reviewed)   Labs    CBC Recent Labs    10/20/22 1222 10/20/22 1231 10/22/22 0201  WBC 8.1  --  6.8  NEUTROABS 6.1  --   --   HGB 12.2* 12.9* 14.4  HCT 36.4* 38.0* 43.1  MCV 87.5  --  86.5  PLT 243  --  282   Basic Metabolic Panel Recent Labs    [birth year] 0538 10/22/22 0201  NA 139 136  K 3.3* 3.9  CL 100 98  CO2 26 24  GLUCOSE 126* 126*  BUN 12 16  CREATININE 1.12 1.40*  CALCIUM 9.6 9.5  MG  --  2.0   Liver Function Tests Recent Labs    10/20/22 1222  AST 55*  ALT 69*  ALKPHOS 75   BILITOT 0.3  PROT 7.2  ALBUMIN 3.7   No results for input(s): "LIPASE", "AMYLASE" in the last 72 hours. Cardiac Enzymes No results for input(s): "CKTOTAL", "CKMB", "CKMBINDEX", "TROPONINI" in the last 72 hours.  BNP: BNP (last 3 results) Recent Labs    10/20/22 1222 10/21/22 0538  BNP 366.0* 564.0*    ProBNP (last 3 results) No results for input(s): "PROBNP" in the last 8760 hours.   D-Dimer No results for input(s): "DDIMER" in the last 72 hours. Hemoglobin A1C Recent Labs    10/22/22 0201  HGBA1C 6.1*   Fasting Lipid Panel No results for input(s): "CHOL", "HDL", "LDLCALC", "TRIG", "CHOLHDL", "LDLDIRECT" in the last 72 hours. Thyroid Function Tests Recent Labs    10/22/22 0201  TSH 5.438*    Other results:   Imaging    ECHOCARDIOGRAM COMPLETE  Result Date: 10/21/2022    ECHOCARDIOGRAM REPORT   Patient Name:   HINTON COTA Date of Exam: 10/21/2022 Medical Rec #:  [id number]       Height:       73.0 in Accession #:    [id number]      Weight:       234.8 lb Date of Birth:  [birth year]       BSA:  2.303 m Patient Age:    66 years        BP:           127/98 mmHg Patient Gender: M               HR:           109 bpm. Exam Location:  Inpatient Procedure: 2D Echo, Intracardiac Opacification Agent, Cardiac Doppler and Color            Doppler  Results communicated to Dr Meriam Sprague at 1552 on 10/21/22. Indications:    Dyspnea R06.00  History:        Patient has no prior history of Echocardiogram examinations.                 CHF, Polysubstance Dependence, Signs/Symptoms:Dyspnea; Risk                 Factors:Hypertension and Former Smoker.  Sonographer:    Aron Baba Referring Phys: [id number] [address] ELGERGAW[address]  Sonographer Comments: Technically difficult study due to poor echo windows. IMPRESSIONS  1. Left ventricular ejection fraction, by estimation, is <20%. The left ventricle has severely decreased function. The left ventricle demonstrates global hypokinesis. The left  ventricular internal cavity size was moderately dilated. Left ventricular diastolic parameters are consistent with Grade I diastolic dysfunction (impaired relaxation).  2. Right ventricular systolic function is normal. The right ventricular size is normal. Tricuspid regurgitation signal is inadequate for assessing PA pressure.  3. Left atrial size was mildly dilated.  4. The mitral valve is normal in structure. Trivial mitral valve regurgitation.  5. The aortic valve is tricuspid. Aortic valve regurgitation is not visualized. No aortic stenosis is present.  6. The inferior vena cava is dilated in size with <50% respiratory variability, suggesting right atrial pressure of 15 mmHg. FINDINGS  Left Ventricle: Left ventricular ejection fraction, by estimation, is <20%. The left ventricle has severely decreased function. The left ventricle demonstrates global hypokinesis. Definity contrast agent was given IV to delineate the left ventricular endocardial borders. The left ventricular internal cavity size was moderately dilated. There is no left ventricular hypertrophy. Left ventricular diastolic parameters are consistent with Grade I diastolic dysfunction (impaired relaxation). Right Ventricle: The right ventricular size is normal. No increase in right ventricular wall thickness. Right ventricular systolic function is normal. Tricuspid regurgitation signal is inadequate for assessing PA pressure. The tricuspid regurgitant velocity is 0.78 m/s, and with an assumed right atrial pressure of 15 mmHg, the estimated right ventricular systolic pressure is 17.5 mmHg. Left Atrium: Left atrial size was mildly dilated. Right Atrium: Right atrial size was normal in size. Pericardium: Trivial pericardial effusion is present. Mitral Valve: The mitral valve is normal in structure. Trivial mitral valve regurgitation. Tricuspid Valve: The tricuspid valve is normal in structure. Tricuspid valve regurgitation is trivial. Aortic Valve: The  aortic valve is tricuspid. Aortic valve regurgitation is not visualized. No aortic stenosis is present. Pulmonic Valve: The pulmonic valve was not well visualized. Pulmonic valve regurgitation is trivial. Aorta: The aortic root and ascending aorta are structurally normal, with no evidence of dilitation. Venous: The inferior vena cava is dilated in size with less than 50% respiratory variability, suggesting right atrial pressure of 15 mmHg. IAS/Shunts: The interatrial septum was not well visualized.  LEFT VENTRICLE PLAX 2D LVIDd:         6.60 cm   Diastology LVIDs:         6.40 cm   LV e' medial:  7.54 cm/s LV PW:         1.20 cm   LV E/e' medial:  7.1 LV IVS:        0.70 cm   LV e' lateral:   14.80 cm/s LVOT diam:     1.70 cm   LV E/e' lateral: 3.6 LV SV:         22 LV SV Index:   10 LVOT Area:     2.27 cm  RIGHT VENTRICLE RV S prime:     11.70 cm/s TAPSE (M-mode): 1.6 cm LEFT ATRIUM             Index        RIGHT ATRIUM           Index LA diam:        3.70 cm 1.61 cm/m   RA Area:     13.50 cm LA Vol (A2C):   87.1 ml 37.81 ml/m  RA Volume:   30.90 ml  13.41 ml/m LA Vol (A4C):   89.1 ml 38.68 ml/m LA Biplane Vol: 90.1 ml 39.12 ml/m  AORTIC VALVE             PULMONIC VALVE LVOT Vmax:   80.55 cm/s  PR End Diast Vel: 3.91 msec LVOT Vmean:  46.400 cm/s LVOT VTI:    0.099 m  AORTA Ao Root diam: 3.60 cm Ao Asc diam:  3.50 cm MITRAL VALVE               TRICUSPID VALVE MV Area (PHT): 4.49 cm    TR Peak grad:   2.5 mmHg MV Decel Time: 169 msec    TR Vmax:        78.50 cm/s MR Peak grad: 3.8 mmHg MR Vmax:      97.90 cm/s   SHUNTS MV E velocity: 53.40 cm/s  Systemic VTI:  0.10 m MV A velocity: 81.70 cm/s  Systemic Diam: 1.70 cm MV E/A ratio:  0.65 Epifanio Lesches MD Electronically signed by Epifanio Lesches MD Signature Date/Time: 10/21/2022/3:54:56 PM    Final      Medications:     Scheduled Medications:  [MAR Hold] aspirin  81 mg Oral Daily   [MAR Hold] azithromycin  500 mg Oral Daily   [MAR Hold]  DULoxetine  20 mg Oral Daily   [MAR Hold] empagliflozin  10 mg Oral Daily   [MAR Hold] enoxaparin (LOVENOX) injection  50 mg Subcutaneous QHS   [MAR Hold] gabapentin  300 mg Oral TID   [MAR Hold] losartan  25 mg Oral Daily   [MAR Hold] pantoprazole  40 mg Oral Daily   [MAR Hold] sodium chloride flush  3 mL Intravenous Q12H   spironolactone  25 mg Oral Daily    Infusions:  [MAR Hold] sodium chloride     sodium chloride 10 mL/hr at 10/22/22 0558   [MAR Hold] cefTRIAXone (ROCEPHIN)  IV 2 g (10/21/22 1810)    PRN Medications: [MAR Hold] sodium chloride, [MAR Hold] acetaminophen, fentaNYL, Heparin (Porcine) in NaCl, heparin sodium (porcine), [MAR Hold] hydrALAZINE, lidocaine (PF), midazolam, [MAR Hold] ondansetron (ZOFRAN) IV, Radial Cocktail/Verapamil only, [MAR Hold] sodium chloride flush   Assessment/Plan   1. Acute systolic CHF: New diagnosis.  Admitted with CHF, echo this admission with EF < 20%, moderate LV dilation, RV normal, IVC dilated.  No evidence for ACS, mildly elevated TnI with no trend is likely demand ischemia from volume overload.  Possible etiologies include substance abuse (cocaine/ETOH), long-standing HTN, ?myocarditis. Also has  wide LBBB. No strong family history of CHF.  Coronary angiography showed no significant coronary disease.  RHC today with RA 6, PCWP 15, CI 2.  Not significantly volume overloaded on exam.  - Start Lasix 20 mg po daily.   - Continue losartan 25 mg daily (no ACEI or ARNI with angioedema on ACEI).  - Increase spironolactone to 25 mg daily - Continue Jardiance 10 mg daily.   - Would ideally get cardiac MRI but he has had trouble with claustrophobia in the past.  - Needs to quit ETOH and cocaine.  - With wide LBBB, may be CRT-D candidate in future if he cuts out substance abuse.  2. HTN: Meds as above.  3. OSA: Uses CPAP.  4. ETOH/cocaine abuse: Needs to quit, counseled that this could play a role in cardiomyopathy.   Think he will be ready for  home in am    Length of Stay: 1  Marca Ancona, MD  10/22/2022, 8:34 AM  Advanced Heart Failure Team Pager [phone] (M-F; 7a - 5p)  Please contact [facility] for night-coverage after hours (5p -7a ) and weekends on [email or web address]

## 2022-10-22 NOTE — Progress Notes (Signed)
LOCATION: right RADIAL  DEFLATED PER PROTOCOL: YES  TIME BAND OFF/DRESSING APPLIED: 1215, gauze with tegaderm  SITE UPON ARRIVAL: LEVEL 0  SITE AFTER BAND REMOVAL: LEVEL 0  CIRCULATION SENSATION AND MOVEMENT: BILATERAL RADIAL PULSES AT +2  COMMENTS:

## 2022-10-23 ENCOUNTER — Encounter: Payer: Self-pay | Admitting: Physician Assistant

## 2022-10-23 ENCOUNTER — Other Ambulatory Visit (HOSPITAL_COMMUNITY): Payer: Self-pay

## 2022-10-23 ENCOUNTER — Encounter (HOSPITAL_COMMUNITY): Payer: Self-pay | Admitting: Cardiology

## 2022-10-23 DIAGNOSIS — I5021 Acute systolic (congestive) heart failure: Secondary | ICD-10-CM

## 2022-10-23 LAB — BASIC METABOLIC PANEL
Anion gap: 12 (ref 5–15)
BUN: 16 mg/dL (ref 8–23)
CO2: 24 mmol/L (ref 22–32)
Calcium: 9.9 mg/dL (ref 8.9–10.3)
Chloride: 102 mmol/L (ref 98–111)
Creatinine, Ser: 1.25 mg/dL — ABNORMAL HIGH (ref 0.61–1.24)
GFR, Estimated: >60 mL/min (ref >60)
Glucose, Bld: 125 mg/dL — ABNORMAL HIGH (ref 70–99)
Potassium: 3.7 mmol/L (ref 3.5–5.1)
Sodium: 138 mmol/L (ref 135–145)

## 2022-10-23 LAB — CBC
HCT: 41.4 % (ref 39.0–52.0)
Hemoglobin: 13.9 g/dL (ref 13.0–17.0)
MCH: 29.6 pg (ref 26.0–34.0)
MCHC: 33.6 g/dL (ref 30.0–36.0)
MCV: 88.1 fL (ref 80.0–100.0)
Platelets: 285 10*3/uL (ref 150–400)
RBC: 4.7 MIL/uL (ref 4.22–5.81)
RDW: 13.4 % (ref 11.5–15.5)
WBC: 5.9 10*3/uL (ref 4.0–10.5)
nRBC: 0 % (ref 0.0–0.2)

## 2022-10-23 MED ORDER — AZITHROMYCIN 500 MG PO TABS
500.0000 mg | ORAL_TABLET | Freq: Every day | ORAL | 0 refills | Status: AC
  Filled 2022-10-23: qty 1, 1d supply, fill #0

## 2022-10-23 MED ORDER — POTASSIUM CHLORIDE CRYS ER 20 MEQ PO TBCR: 20.0000 meq | EXTENDED_RELEASE_TABLET | Freq: Every day | ORAL | Status: DC

## 2022-10-23 MED ORDER — CARVEDILOL 3.125 MG PO TABS: 3.1250 mg | ORAL_TABLET | Freq: Two times a day (BID) | ORAL | Status: DC

## 2022-10-23 MED ORDER — POTASSIUM CHLORIDE CRYS ER 20 MEQ PO TBCR
20.0000 meq | EXTENDED_RELEASE_TABLET | Freq: Every day | ORAL | 0 refills | Status: DC
  Filled 2022-10-23: qty 90, 90d supply, fill #0

## 2022-10-23 MED ORDER — LOSARTAN POTASSIUM 25 MG PO TABS
25.0000 mg | ORAL_TABLET | Freq: Every day | ORAL | 0 refills | Status: DC
  Filled 2022-10-23: qty 90, 90d supply, fill #0

## 2022-10-23 MED ORDER — FUROSEMIDE 20 MG PO TABS
20.0000 mg | ORAL_TABLET | Freq: Every day | ORAL | 0 refills | Status: DC
  Filled 2022-10-23: qty 90, 90d supply, fill #0

## 2022-10-23 MED ORDER — EMPAGLIFLOZIN 10 MG PO TABS
10.0000 mg | ORAL_TABLET | Freq: Every day | ORAL | 0 refills | Status: DC
Start: 2022-10-24 — End: 2022-11-02
  Filled 2022-10-23: qty 30, 30d supply, fill #0

## 2022-10-23 MED ORDER — CARVEDILOL 3.125 MG PO TABS
3.1250 mg | ORAL_TABLET | Freq: Two times a day (BID) | ORAL | Status: DC
Start: 2022-10-23 — End: 2022-10-23

## 2022-10-23 MED ORDER — SPIRONOLACTONE 25 MG PO TABS
25.0000 mg | ORAL_TABLET | Freq: Every day | ORAL | 0 refills | Status: DC
  Filled 2022-10-23: qty 90, 90d supply, fill #0

## 2022-10-23 MED ORDER — CEFDINIR 300 MG PO CAPS
300.0000 mg | ORAL_CAPSULE | Freq: Two times a day (BID) | ORAL | 0 refills | Status: AC
  Filled 2022-10-23: qty 4, 2d supply, fill #0

## 2022-10-23 NOTE — TOC Initial Note (Addendum)
Transition of Care [facility]) - Initial/Assessment Note    Patient Details  Name: Maxwell Harris MRN: [id number] Date of Birth: [birth year]  [facility]) CM/SW Contact:    Elliot Cousin, RN Phone Number: [phone] 10/23/2022, 1:11 PM  Clinical Narrative:    HF TOC CM spoke to pt and provided LIving Better with Heart Failure booklet. Reviewed with pt daily weight and low sodium and heart healthy diet. Pt will provide transportation home. Educated [facility] to [facility] up [facility]t.       [facility] auth # [id number]            Expected Discharge Plan: Home/Self Care Barriers to Discharge: No Barriers Identified   Patient Goals and CMS Choice Patient states their goals for this hospitalization and ongoing recovery are:: return home          Expected Discharge Plan and Services In-house Referral: Clinical Social Work Discharge Planning Services: CM Consult   Living arrangements for the past 2 months: Single Family Home Expected Discharge Date: 10/23/22                                    Prior Living Arrangements/Services Living arrangements for the past 2 months: Single Family Home Lives with:: Self Patient language and need for interpreter reviewed:: Yes Do you feel safe going back to the place where you live?: Yes      Need for Family Participation in Patient Care: Yes (Comment) Care giver support system in place?: Yes (comment) Current home services: DME (cane, stairlift) Criminal Activity/Legal Involvement Pertinent to Current Situation/Hospitalization: No - Comment as needed  Activities of Daily Living Home Assistive Devices/Equipment: None ADL Screening (condition at time of admission) Patient's cognitive ability adequate to safely complete daily activities?: Yes Is the patient deaf or have difficulty hearing?: No Does the patient have difficulty seeing, even when wearing glasses/contacts?: No Does the patient have  difficulty concentrating, remembering, or making decisions?: No Patient able to express need for assistance with ADLs?: Yes Does the patient have difficulty dressing or bathing?: No Independently performs ADLs?: Yes (appropriate for developmental age) Does the patient have difficulty walking or climbing stairs?: No Weakness of Legs: None Weakness of Arms/Hands: None  Permission Sought/Granted Permission sought to share information with : Family Supports                Emotional Assessment Appearance:: Appears stated age Attitude/Demeanor/Rapport: Engaged Affect (typically observed): Appropriate Orientation: : Oriented to Self, Oriented to Place, Oriented to  Time, Oriented to Situation Alcohol / Substance Use: Not Applicable Psych Involvement: No (comment)  Admission diagnosis:  Dyspnea [R06.00] Patient Active Problem List   Diagnosis Date Noted   Acute systolic CHF (congestive heart failure) (HCC) 10/23/2022   Dyspnea 10/20/2022   Essential hypertension 10/20/2022   Acute CHF (HCC) 10/20/2022   Polysubstance (excluding opioids) dependence (HCC) 10/20/2022   Iron deficiency anemia 03/27/2022   PCP:  Sherwood Gambler, MD Pharmacy:   [id number] - [place], [place] - [address] [place] [address] Phone: [phone] Fax: [phone]  [facility] [address] [place] [address] Phone: [phone] Fax: [phone]     Social Determinants of Health (SDOH) Social History: SDOH Screenings   Food Insecurity: No Food Insecurity (10/20/2022)  Housing: Low Risk  (10/20/2022)  Transportation Needs: No Transportation Needs (10/20/2022)  Utilities: Not At Risk (10/20/2022)  Social Connections: Unknown (07/03/2021)   Received from [facility]  Tobacco Use: Medium Risk (10/23/2022)   SDOH Interventions:     Readmission Risk Interventions     No data to display

## 2022-10-23 NOTE — Plan of Care (Signed)

## 2022-10-23 NOTE — Discharge Summary (Signed)
Physician Discharge Summary  Maxwell Harris NWG:[id number] DOB: 08-19-1955 DOA: 10/20/2022  PCP: Sherwood Gambler, MD  Admit date: 10/20/2022 Discharge date: 10/23/2022  Admitted From: Home Disposition: Home  Recommendations for Outpatient Follow-up:  Follow up with PCP in 1-2 weeks Follow-up with advanced heart failure clinic as scheduled on 11/02/2022 at 2:30 PM Started on losartan 25 mg p.o. daily, spironolactone 25 mg p.o. daily, furosemide 20 mg p.o. daily, potassium chloride 20 mg p.o. daily and Jardiance 10 mg p.o. daily. Stop aspirin Please obtain BMP in 1 week to ensure stability of renal function.  Also recommend repeat TFTs outpatient.  Slightly elevated TSH on admission.  Home Health: No Equipment/Devices: None  Discharge Condition: Stable CODE STATUS: Full code Diet recommendation: Heart healthy diet  History of present illness:  Maxwell Harris is a 67 year old male with past medical history significant for HTN, asthma, COPD, CAD, GERD, prediabetes, neuropathic pain, polysubstance abuse who presented to [facility] ED on 8/27 with progressive shortness of breath, wheezing, orthopnea at nighttime.  Onset 3 days prior to hospitalization and denies fever, no nausea/vomiting, no sick contacts.  In the ED, temperature 98.6 3 Fahrenheit, HR 95, RR 28, BP 120/67, SpO2 94% on room air.  WBC 8.1, hemoglobin 12.2, platelet count 243.  Sodium 138, potassium 3.4, chloride 104, CO2 22, glucose 151, BUN 14, creatinine 1.18.  EtOH level less than 10.  BNP elevated 366.  COVID/influenza/RSV/respiratory viral panel negative.  UDS positive for cocaine/THC.  Chest x-ray with no acute cardiopulmonary disease process.  CTA negative for PE but with significant bilateral bronchial wall thickening with scattered groundglass airspace disease and trace bilateral pleural effusion consistent with multifocal bronchopneumonia, cardiomegaly with prominent left ventricle dilation.  Patient received  IV Lasix in the ED.  TRH consulted for admission for further evaluation management for concern of multifocal pneumonia, possible new onset CHF.  Hospital course:  Acute systolic congestive heart failure, new diagnosis Patient presenting the ED with 3-day history of progressive shortness of breath, associate with orthopnea.  Noted to have elevated BNP on admission with groundglass airspace disease on CT angiogram chest.  TTE with LVEF less than 20%, moderate LV dilation, RV normal, IVC dilated.  Complicated by history of cocaine/alcohol abuse and longstanding hypertension.  Cardiology was consulted and patient underwent coronary angiography with no significant coronary disease noted.  Patient was started on IV diuresis with improvement of symptoms.  Cardiology would recommend cardiac MRI but has had significant claustrophobia in the past, will defer to outpatient.  Also with LBBB NABS ER-D candidate in the future if he is able to abstain from substance abuse.  Will continue losartan 25 mg p.o. daily, spironolactone 25 mg p.o. daily, furosemide 20 mg p.o. daily, Jardiance 10 mg p.o. daily, potassium chloride 20 mg p.o. daily.  Patient has outpatient appointment follow-up with advanced heart failure clinic on 11/02/2022 at 2:30 PM.  Multifocal pneumonia CT angiogram chest negative for PE with concerns for atypical infection, also likely complicated by volume overload with new CHF diagnosis above.  Legionella/strep pneumo antigen negative.  Will complete 5-day course of azithromycin/ceftriaxone.  Hypokalemia Repleted.  Will discharge on potassium supplement daily.  Essential hypertension Continue losartan, spironolactone, furosemide.  Neuropathy Continue gabapentin  GERD Continue omeprazole  Polysubstance abuse Patient with history of EtOH use disorder.  Also UDS positive for cocaine/THC.  Counseled on need for complete cessation/abstinence.  Obesity Body mass index is 29.24 kg/m.  Complicates  all facets of care.  Discharge Diagnoses:  Principal  Problem:   Dyspnea Active Problems:   Essential hypertension   Acute CHF (HCC)   Polysubstance (excluding opioids) dependence [facility])    Discharge Instructions  Discharge Instructions     Call MD for:  difficulty breathing, headache or visual disturbances   Complete by: As directed    Call MD for:  extreme fatigue   Complete by: As directed    Call MD for:  persistant dizziness or light-headedness   Complete by: As directed    Call MD for:  persistant nausea and vomiting   Complete by: As directed    Call MD for:  severe uncontrolled pain   Complete by: As directed    Call MD for:  temperature >100.4   Complete by: As directed    Diet - low sodium heart healthy   Complete by: As directed    Increase activity slowly   Complete by: As directed       Allergies as of 10/23/2022       Reactions   Lisinopril Swelling   Shellfish Allergy         Medication List     STOP taking these medications    amLODipine 5 MG tablet Commonly known as: NORVASC   aspirin 81 MG chewable tablet       TAKE these medications    acetaminophen 500 MG tablet Commonly known as: TYLENOL Take 500 mg by mouth every 6 (six) hours as needed. joint   azithromycin 500 MG tablet Commonly known as: Zithromax Take 1 tablet (500 mg total) by mouth daily for 1 day. Start taking on: October 24, 2022   cefdinir 300 MG capsule Commonly known as: OMNICEF Take 1 capsule (300 mg total) by mouth 2 (two) times daily for 2 days.   cholecalciferol 1000 units tablet Commonly known as: VITAMIN D Take 1,000 Units by mouth daily.   cycloSPORINE 0.05 % ophthalmic emulsion Commonly known as: RESTASIS Place 1 drop into both eyes 2 (two) times daily.   diclofenac Sodium 1 % Gel Commonly known as: VOLTAREN Apply 2 g topically 4 (four) times daily as needed (pain).   DULoxetine 20 MG capsule Commonly known as: CYMBALTA Take 20 mg by mouth  daily.   empagliflozin 10 MG Tabs tablet Commonly known as: JARDIANCE Take 1 tablet (10 mg total) by mouth daily. Start taking on: October 24, 2022   famotidine 20 MG tablet Commonly known as: PEPCID Take 1 tablet (20 mg total) by mouth daily.   furosemide 20 MG tablet Commonly known as: LASIX Take 1 tablet (20 mg total) by mouth daily. Start taking on: October 24, 2022   gabapentin 300 MG capsule Commonly known as: NEURONTIN Take 300 mg by mouth 3 (three) times daily.   losartan 25 MG tablet Commonly known as: COZAAR Take 1 tablet (25 mg total) by mouth daily. Start taking on: October 24, 2022   OMEPRAZOLE PO Take 20 mg by mouth daily.   potassium chloride SA 20 MEQ tablet Commonly known as: KLOR-CON M Take 1 tablet (20 mEq total) by mouth daily.   sildenafil 100 MG tablet Commonly known as: VIAGRA Take 100 mg by mouth as needed for erectile dysfunction.   spironolactone 25 MG tablet Commonly known as: ALDACTONE Take 1 tablet (25 mg total) by mouth daily. Start taking on: October 24, 2022        Follow-up Information     [facility] Follow up on 11/02/2022.   Specialty: Cardiology  Why: at 2:30 Contact information: [address] [place] [address] [phone]        Sherwood Gambler, MD. Schedule an appointment as soon as possible for a visit in 1 week(s).   Specialty: Internal Medicine Contact information: [phone] [facility] MEDICAL [facility] [place] [address] [phone]                Allergies  Allergen Reactions   Lisinopril Swelling   Shellfish Allergy     Consultations: Cardiology   Procedures/Studies: CARDIAC CATHETERIZATION  Result Date: 10/22/2022 1. Mild luminal irregularities in coronaries. 2. Near-normal filling pressures. 3. Low CI at 2.0.   ECHOCARDIOGRAM COMPLETE  Result Date: 10/21/2022    ECHOCARDIOGRAM REPORT   Patient Name:   ENEDINO CORIO Date of  Exam: 10/21/2022 Medical Rec #:  [id number]       Height:       73.0 in Accession #:    [id number]      Weight:       234.8 lb Date of Birth:  [birth year]       BSA:          2.303 m Patient Age:    66 years        BP:           127/98 mmHg Patient Gender: M               HR:           109 bpm. Exam Location:  Inpatient Procedure: 2D Echo, Intracardiac Opacification Agent, Cardiac Doppler and Color            Doppler  Results communicated to Dr Meriam Sprague at 1552 on 10/21/22. Indications:    Dyspnea R06.00  History:        Patient has no prior history of Echocardiogram examinations.                 CHF, Polysubstance Dependence, Signs/Symptoms:Dyspnea; Risk                 Factors:Hypertension and Former Smoker.  Sonographer:    Aron Baba Referring Phys: [id number]  Sonographer Comments: Technically difficult study due to poor echo windows. IMPRESSIONS  1. Left ventricular ejection fraction, by estimation, is <20%. The left ventricle has severely decreased function. The left ventricle demonstrates global hypokinesis. The left ventricular internal cavity size was moderately dilated. Left ventricular diastolic parameters are consistent with Grade I diastolic dysfunction (impaired relaxation).  2. Right ventricular systolic function is normal. The right ventricular size is normal. Tricuspid regurgitation signal is inadequate for assessing PA pressure.  3. Left atrial size was mildly dilated.  4. The mitral valve is normal in structure. Trivial mitral valve regurgitation.  5. The aortic valve is tricuspid. Aortic valve regurgitation is not visualized. No aortic stenosis is present.  6. The inferior vena cava is dilated in size with <50% respiratory variability, suggesting right atrial pressure of 15 mmHg. FINDINGS  Left Ventricle: Left ventricular ejection fraction, by estimation, is <20%. The left ventricle has severely decreased function. The left ventricle demonstrates global hypokinesis. Definity contrast  agent was given IV to delineate the left ventricular endocardial borders. The left ventricular internal cavity size was moderately dilated. There is no left ventricular hypertrophy. Left ventricular diastolic parameters are consistent with Grade I diastolic dysfunction (impaired relaxation). Right Ventricle: The right ventricular size is normal. No increase in right ventricular wall thickness. Right ventricular systolic function is normal. Tricuspid regurgitation signal  is inadequate for assessing PA pressure. The tricuspid regurgitant velocity is 0.78 m/s, and with an assumed right atrial pressure of 15 mmHg, the estimated right ventricular systolic pressure is 17.5 mmHg. Left Atrium: Left atrial size was mildly dilated. Right Atrium: Right atrial size was normal in size. Pericardium: Trivial pericardial effusion is present. Mitral Valve: The mitral valve is normal in structure. Trivial mitral valve regurgitation. Tricuspid Valve: The tricuspid valve is normal in structure. Tricuspid valve regurgitation is trivial. Aortic Valve: The aortic valve is tricuspid. Aortic valve regurgitation is not visualized. No aortic stenosis is present. Pulmonic Valve: The pulmonic valve was not well visualized. Pulmonic valve regurgitation is trivial. Aorta: The aortic root and ascending aorta are structurally normal, with no evidence of dilitation. Venous: The inferior vena cava is dilated in size with less than 50% respiratory variability, suggesting right atrial pressure of 15 mmHg. IAS/Shunts: The interatrial septum was not well visualized.  LEFT VENTRICLE PLAX 2D LVIDd:         6.60 cm   Diastology LVIDs:         6.40 cm   LV e' medial:    7.54 cm/s LV PW:         1.20 cm   LV E/e' medial:  7.1 LV IVS:        0.70 cm   LV e' lateral:   14.80 cm/s LVOT diam:     1.70 cm   LV E/e' lateral: 3.6 LV SV:         22 LV SV Index:   10 LVOT Area:     2.27 cm  RIGHT VENTRICLE RV S prime:     11.70 cm/s TAPSE (M-mode): 1.6 cm LEFT ATRIUM              Index        RIGHT ATRIUM           Index LA diam:        3.70 cm 1.61 cm/m   RA Area:     13.50 cm LA Vol (A2C):   87.1 ml 37.81 ml/m  RA Volume:   30.90 ml  13.41 ml/m LA Vol (A4C):   89.1 ml 38.68 ml/m LA Biplane Vol: 90.1 ml 39.12 ml/m  AORTIC VALVE             PULMONIC VALVE LVOT Vmax:   80.55 cm/s  PR End Diast Vel: 3.91 msec LVOT Vmean:  46.400 cm/s LVOT VTI:    0.099 m  AORTA Ao Root diam: 3.60 cm Ao Asc diam:  3.50 cm MITRAL VALVE               TRICUSPID VALVE MV Area (PHT): 4.49 cm    TR Peak grad:   2.5 mmHg MV Decel Time: 169 msec    TR Vmax:        78.50 cm/s MR Peak grad: 3.8 mmHg MR Vmax:      97.90 cm/s   SHUNTS MV E velocity: 53.40 cm/s  Systemic VTI:  0.10 m MV A velocity: 81.70 cm/s  Systemic Diam: 1.70 cm MV E/A ratio:  0.65 Epifanio Lesches MD Electronically signed by Epifanio Lesches MD Signature Date/Time: 10/21/2022/3:54:56 PM    Final    CT Angio Chest PE W and/or Wo Contrast  Result Date: 10/20/2022 CLINICAL DATA:  Short of breath EXAM: CT ANGIOGRAPHY CHEST WITH CONTRAST TECHNIQUE: Multidetector CT imaging of the chest was performed using the standard protocol during bolus administration of intravenous contrast.  Multiplanar CT image reconstructions and MIPs were obtained to evaluate the vascular anatomy. RADIATION DOSE REDUCTION: This exam was performed according to the departmental dose-optimization program which includes automated exposure control, adjustment of the mA and/or kV according to patient size and/or use of iterative reconstruction technique. CONTRAST:  75mL OMNIPAQUE IOHEXOL 350 MG/ML SOLN COMPARISON:  10/20/2022 FINDINGS: Cardiovascular: This is a technically adequate evaluation of the pulmonary vasculature. No filling defects or pulmonary emboli. Mild cardiomegaly, with prominent left ventricular dilatation. No pericardial effusion. Normal caliber of the thoracic aorta. Atherosclerosis of the aorta and coronary vasculature. Mediastinum/Nodes:  Borderline enlarged mediastinal and hilar lymph nodes, largest measuring 14 mm in the subcarinal region image 67/5, nonspecific. Thyroid, trachea, and esophagus are unremarkable. Lungs/Pleura: There are trace bilateral pleural effusions, right greater than left. Scattered ground-glass opacities are seen within the dependent upper lobes and bilateral lower lobes, which may reflect a combination of hypoventilatory change and underlying inflammatory/infectious etiology. There is bilateral bronchial wall thickening, most pronounced within the lower lobes. No pneumothorax. Central airways are patent. Upper Abdomen: Hepatic steatosis. No acute upper abdominal findings. Musculoskeletal: No acute displaced fracture. 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 Reconstructed images demonstrate no additional findings. Review of the MIP images confirms the above findings. IMPRESSION: 1. No evidence of pulmonary embolus. 2. Bilateral bronchial wall thickening, with scattered ground-glass airspace disease and trace bilateral pleural effusions, consistent with multifocal bronchopneumonia bronchitis. 3. Borderline mediastinal and hilar lymph nodes, consistent with reactive adenopathy. 4. Cardiomegaly, with prominent left ventricular dilatation. 5. Aortic Atherosclerosis (ICD10-I70.0). Coronary artery atherosclerosis. 6. Hepatic steatosis. Electronically Signed   By: Sharlet Salina M.D.   On: 10/20/2022 16:16   DG Chest 2 View  Result Date: 10/20/2022 CLINICAL DATA:  Shortness of breath EXAM: CHEST - 2 VIEW COMPARISON:  None Available. FINDINGS: No pneumothorax or effusion. Normal cardiopericardial silhouette. Overlapping cardiac leads. No edema. Degenerative changes along the spine. IMPRESSION: No acute cardiopulmonary disease. Electronically Signed   By: Karen Kays M.D.   On: 10/20/2022 14:43     Subjective: Patient seen examined bedside, resting  comfortably.  No specific complaints this morning.  Shortness of breath remains resolved.  Ready for discharge home.  Seen by cardiology and okay for discharge home with outpatient follow-up scheduled with advanced heart failure clinic.  Patient denies headache, no dizziness, no chest pain, no palpitations, no shortness of breath, no abdominal pain, no fever/chills/night sweats, no nausea/vomiting/diarrhea, no focal weakness, no fatigue, no paresthesias.  No acute events overnight per nurse staff.  Discharge Exam: Vitals:   10/23/22 0407 10/23/22 0811  BP: 115/82 120/85  Pulse: 87 71  Resp: 18 18  Temp: 98.3 F (36.8 C) 98.1 F (36.7 C)  SpO2: 96% 93%   Vitals:   10/22/22 2324 10/23/22 0407 10/23/22 0641 10/23/22 0811  BP: (!) 106/58 115/82  120/85  Pulse: 81 87  71  Resp: 17 18  18   Temp: 98.8 F (37.1 C) 98.3 F (36.8 C)  98.1 F (36.7 C)  TempSrc: Oral Oral  Oral  SpO2: 96% 96%  93%  Weight:   100.5 kg   Height:        Physical Exam: GEN: NAD, alert and oriented x 3, obese HEENT: NCAT, PERRL, EOMI, sclera clear, MMM PULM: CTAB w/o wheezes/crackles, normal respiratory effort, on room air CV: RRR w/o M/G/R GI: abd soft, NTND, NABS, no R/G/M MSK: no peripheral edema, muscle strength globally intact 5/5  bilateral upper/lower extremities NEURO: CN II-XII intact, no focal deficits, sensation to light touch intact PSYCH: normal mood/affect Integumentary: dry/intact, no rashes or wounds    The results of significant diagnostics from this hospitalization (including imaging, microbiology, ancillary and laboratory) are listed below for reference.     Microbiology: Recent Results (from the past 240 hour(s))  SARS CORONAVIRUS 2 (TAT 6-24 HRS) Anterior Nasal Swab     Status: None   Collection Time: 10/20/22 10:56 AM   Specimen: Anterior Nasal Swab  Result Value Ref Range Status   SARS Coronavirus 2 NEGATIVE NEGATIVE Final    Comment: (NOTE) SARS-CoV-2 target nucleic acids are  NOT DETECTED.  The SARS-CoV-2 RNA is generally detectable in upper and lower respiratory specimens during the acute phase of infection. Negative results do not preclude SARS-CoV-2 infection, do not rule out co-infections with other pathogens, and should not be used as the sole basis for treatment or other patient management decisions. Negative results must be combined with clinical observations, patient history, and epidemiological information. The expected result is Negative.  Fact Sheet for Patients: HairSlick.no  Fact Sheet for Healthcare Providers: quierodirigir.com  This test is not yet approved or cleared by the [place] FDA and  has been authorized for detection and/or diagnosis of SARS-CoV-2 by FDA under an Emergency Use Authorization (EUA). This EUA will remain  in effect (meaning this test can be used) for the duration of the COVID-19 declaration under Se ction 564(b)(1) of the Act, 21 U.S.C. section 360bbb-3(b)(1), unless the authorization is terminated or revoked sooner.  Performed at [facility], [address], [place], [place] [address]   SARS Coronavirus 2 by RT PCR (hospital order, performed in [facility] hospital lab) *cepheid single result test* Anterior Nasal Swab     Status: None   Collection Time: 10/20/22  1:33 PM   Specimen: Anterior Nasal Swab  Result Value Ref Range Status   SARS Coronavirus 2 by RT PCR NEGATIVE NEGATIVE Final    Comment: Performed at [facility], [address] [place], [place] [address]  Respiratory (~20 pathogens) panel by PCR     Status: None   Collection Time: 10/20/22  4:43 PM   Specimen: Nasopharyngeal Swab; Respiratory  Result Value Ref Range Status   Adenovirus NOT DETECTED NOT DETECTED Final   Coronavirus 229E NOT DETECTED NOT DETECTED Final    Comment: (NOTE) The Coronavirus on the Respiratory Panel, DOES NOT test for the novel  Coronavirus (2019  nCoV)    Coronavirus HKU1 NOT DETECTED NOT DETECTED Final   Coronavirus NL63 NOT DETECTED NOT DETECTED Final   Coronavirus OC43 NOT DETECTED NOT DETECTED Final   Metapneumovirus NOT DETECTED NOT DETECTED Final   Rhinovirus / Enterovirus NOT DETECTED NOT DETECTED Final   Influenza A NOT DETECTED NOT DETECTED Final   Influenza B NOT DETECTED NOT DETECTED Final   Parainfluenza Virus 1 NOT DETECTED NOT DETECTED Final   Parainfluenza Virus 2 NOT DETECTED NOT DETECTED Final   Parainfluenza Virus 3 NOT DETECTED NOT DETECTED Final   Parainfluenza Virus 4 NOT DETECTED NOT DETECTED Final   Respiratory Syncytial Virus NOT DETECTED NOT DETECTED Final   Bordetella pertussis NOT DETECTED NOT DETECTED Final   Bordetella Parapertussis NOT DETECTED NOT DETECTED Final   Chlamydophila pneumoniae NOT DETECTED NOT DETECTED Final   Mycoplasma pneumoniae NOT DETECTED NOT DETECTED Final    Comment: Performed at [facility], [address] [place], [place] [address]  Labs: BNP (last 3 results) Recent Labs    10/20/22 1222 10/21/22 0538  BNP 366.0* 564.0*   Basic Metabolic Panel: Recent Labs  Lab 10/20/22 1222 10/20/22 1231 10/21/22 0538 10/22/22 0201 10/22/22 0824 10/22/22 0836 10/23/22 0307  NA 138 140 139 136 140  141 142 138  K 3.3* 3.4* 3.3* 3.9 3.8  3.7 3.5 3.7  CL 104 104 100 98  --   --  102  CO2 22  --  26 24  --   --  24  GLUCOSE 151* 149* 126* 126*  --   --  125*  BUN 14 15 12 16   --   --  16  CREATININE 1.18 1.10 1.12 1.40*  --   --  1.25*  CALCIUM 9.3  --  9.6 9.5  --   --  9.9  MG  --   --   --  2.0  --   --   --    Liver Function Tests: Recent Labs  Lab 10/20/22 1222  AST 55*  ALT 69*  ALKPHOS 75  BILITOT 0.3  PROT 7.2  ALBUMIN 3.7   No results for input(s): "LIPASE", "AMYLASE" in the last 168 hours. No results for input(s): "AMMONIA" in the last 168 hours. CBC: Recent Labs  Lab 10/20/22 1222 10/20/22 1231 10/22/22 0201 10/22/22 0824  10/22/22 0836 10/23/22 0307  WBC 8.1  --  6.8  --   --  5.9  NEUTROABS 6.1  --   --   --   --   --   HGB 12.2* 12.9* 14.4 14.6  14.3 13.6 13.9  HCT 36.4* 38.0* 43.1 43.0  42.0 40.0 41.4  MCV 87.5  --  86.5  --   --  88.1  PLT 243  --  282  --   --  285   Cardiac Enzymes: No results for input(s): "CKTOTAL", "CKMB", "CKMBINDEX", "TROPONINI" in the last 168 hours. BNP: Invalid input(s): "POCBNP" CBG: Recent Labs  Lab 10/20/22 1237  GLUCAP 120*   D-Dimer No results for input(s): "DDIMER" in the last 72 hours. Hgb A1c Recent Labs    10/22/22 0201  HGBA1C 6.1*   Lipid Profile No results for input(s): "CHOL", "HDL", "LDLCALC", "TRIG", "CHOLHDL", "LDLDIRECT" in the last 72 hours. Thyroid function studies Recent Labs    10/22/22 0201  TSH 5.438*   Anemia work up No results for input(s): "VITAMINB12", "FOLATE", "FERRITIN", "TIBC", "IRON", "RETICCTPCT" in the last 72 hours. Urinalysis    Component Value Date/Time   COLORURINE YELLOW 07/24/2011 0439   APPEARANCEUR CLEAR 07/24/2011 0439   LABSPEC 1.021 07/24/2011 0439   PHURINE 5.5 07/24/2011 0439   GLUCOSEU NEGATIVE 07/24/2011 0439   HGBUR LARGE (A) 07/24/2011 0439   BILIRUBINUR NEGATIVE 07/24/2011 0439   KETONESUR NEGATIVE 07/24/2011 0439   PROTEINUR 30 (A) 07/24/2011 0439   UROBILINOGEN 0.2 07/24/2011 0439   NITRITE NEGATIVE 07/24/2011 0439   LEUKOCYTESUR NEGATIVE 07/24/2011 0439   Sepsis Labs Recent Labs  Lab 10/20/22 1222 10/22/22 0201 10/23/22 0307  WBC 8.1 6.8 5.9   Microbiology Recent Results (from the past 240 hour(s))  SARS CORONAVIRUS 2 (TAT 6-24 HRS) Anterior Nasal Swab     Status: None   Collection Time: 10/20/22 10:56 AM   Specimen: Anterior Nasal Swab  Result Value Ref Range Status   SARS Coronavirus 2 NEGATIVE NEGATIVE Final    Comment: (NOTE) SARS-CoV-2 target nucleic acids are NOT DETECTED.  The SARS-CoV-2 RNA is generally detectable in upper and lower  respiratory specimens during the  acute phase of infection. Negative results do not preclude SARS-CoV-2 infection, do not rule out co-infections with other pathogens, and should not be used as the sole basis for treatment or other patient management decisions. Negative results must be combined with clinical observations, patient history, and epidemiological information. The expected result is Negative.  Fact Sheet for Patients: HairSlick.no  Fact Sheet for Healthcare Providers: quierodirigir.com  This test is not yet approved or cleared by the [place] FDA and  has been authorized for detection and/or diagnosis of SARS-CoV-2 by FDA under an Emergency Use Authorization (EUA). This EUA will remain  in effect (meaning this test can be used) for the duration of the COVID-19 declaration under Se ction 564(b)(1) of the Act, 21 U.S.C. section 360bbb-3(b)(1), unless the authorization is terminated or revoked sooner.  Performed at [facility] [facility], [address], [place], [place] [address]   SARS Coronavirus 2 by RT PCR (hospital order, performed in [facility] hospital lab) *cepheid single result test* Anterior Nasal Swab     Status: None   Collection Time: 10/20/22  1:33 PM   Specimen: Anterior Nasal Swab  Result Value Ref Range Status   SARS Coronavirus 2 by RT PCR NEGATIVE NEGATIVE Final    Comment: Performed at [facility], [address], [place], [place] [address]  Respiratory (~20 pathogens) panel by PCR     Status: None   Collection Time: 10/20/22  4:43 PM   Specimen: Nasopharyngeal Swab; Respiratory  Result Value Ref Range Status   Adenovirus NOT DETECTED NOT DETECTED Final   Coronavirus 229E NOT DETECTED NOT DETECTED Final    Comment: (NOTE) The Coronavirus on the Respiratory Panel, DOES NOT test for the novel  Coronavirus (2019 nCoV)    Coronavirus HKU1 NOT DETECTED NOT DETECTED Final   Coronavirus NL63 NOT DETECTED NOT DETECTED Final    Coronavirus OC43 NOT DETECTED NOT DETECTED Final   Metapneumovirus NOT DETECTED NOT DETECTED Final   Rhinovirus / Enterovirus NOT DETECTED NOT DETECTED Final   Influenza A NOT DETECTED NOT DETECTED Final   Influenza B NOT DETECTED NOT DETECTED Final   Parainfluenza Virus 1 NOT DETECTED NOT DETECTED Final   Parainfluenza Virus 2 NOT DETECTED NOT DETECTED Final   Parainfluenza Virus 3 NOT DETECTED NOT DETECTED Final   Parainfluenza Virus 4 NOT DETECTED NOT DETECTED Final   Respiratory Syncytial Virus NOT DETECTED NOT DETECTED Final   Bordetella pertussis NOT DETECTED NOT DETECTED Final   Bordetella Parapertussis NOT DETECTED NOT DETECTED Final   Chlamydophila pneumoniae NOT DETECTED NOT DETECTED Final   Mycoplasma pneumoniae NOT DETECTED NOT DETECTED Final    Comment: Performed at [facility], [address] [place], [place] [address]     Time coordinating discharge: Over 30 minutes  SIGNED:   Alvira Philips [place], DO  [facility] 10/23/2022, 12:20 PM

## 2022-10-23 NOTE — Progress Notes (Addendum)
Advanced Heart Failure Rounding Note  PCP-Cardiologist: None   Subjective:    Feeling well. No dyspnea. Ready to go home.    LHC/RHC 08/29: No significant coronary disease.  RA mean 6 PA 34/17 PCWP mean 15 CI 2  Objective:   Weight Range: 100.5 kg Body mass index is 29.24 kg/m.   Vital Signs:   Temp:  [97.5 F (36.4 C)-98.8 F (37.1 C)] 98.1 F (36.7 C) (08/30 0811) Pulse Rate:  [71-87] 71 (08/30 0811) Resp:  [17-18] 18 (08/30 0811) BP: (102-125)/(58-85) 120/85 (08/30 0811) SpO2:  [93 %-97 %] 93 % (08/30 0811) Weight:  [100.5 kg] 100.5 kg (08/30 0641) Last BM Date : 10/22/22  Weight change: Filed Weights   10/21/22 2300 10/22/22 0500 10/23/22 0641  Weight: 102 kg 101.6 kg 100.5 kg    Intake/Output:   Intake/Output Summary (Last 24 hours) at 10/23/2022 1134 Last data filed at 10/23/2022 0600 Gross per 24 hour  Intake 352.24 ml  Output 400 ml  Net -47.76 ml      Physical Exam    General:  Well appearing.  HEENT: Normal Neck: Supple. JVP not elevated. Carotids 2+ bilat; no bruits.  Cor: PMI nondisplaced. Regular rate & rhythm. No rubs, gallops or murmurs. Lungs: Clear Abdomen: Soft, nontender, nondistended.  Extremities: No cyanosis, clubbing, rash, edema Neuro: Alert & orientedx3. Affect pleasant   Telemetry   NSR 70s, rare PVCs   Labs    CBC Recent Labs    10/20/22 1222 10/20/22 1231 10/22/22 0201 10/22/22 0824 10/22/22 0836 10/23/22 0307  WBC 8.1  --  6.8  --   --  5.9  NEUTROABS 6.1  --   --   --   --   --   HGB 12.2*   < > 14.4   < > 13.6 13.9  HCT 36.4*   < > 43.1   < > 40.0 41.4  MCV 87.5  --  86.5  --   --  88.1  PLT 243  --  282  --   --  285   < > = values in this interval not displayed.   Basic Metabolic Panel Recent Labs    40/98/11 0201 10/22/22 0824 10/22/22 0836 10/23/22 0307  NA 136   < > 142 138  K 3.9   < > 3.5 3.7  CL 98  --   --  102  CO2 24  --   --  24  GLUCOSE 126*  --   --  125*  BUN 16  --    --  16  CREATININE 1.40*  --   --  1.25*  CALCIUM 9.5  --   --  9.9  MG 2.0  --   --   --    < > = values in this interval not displayed.   Liver Function Tests Recent Labs    10/20/22 1222  AST 55*  ALT 69*  ALKPHOS 75  BILITOT 0.3  PROT 7.2  ALBUMIN 3.7   No results for input(s): "LIPASE", "AMYLASE" in the last 72 hours. Cardiac Enzymes No results for input(s): "CKTOTAL", "CKMB", "CKMBINDEX", "TROPONINI" in the last 72 hours.  BNP: BNP (last 3 results) Recent Labs    10/20/22 1222 10/21/22 0538  BNP 366.0* 564.0*    ProBNP (last 3 results) No results for input(s): "PROBNP" in the last 8760 hours.   D-Dimer No results for input(s): "DDIMER" in the last 72 hours. Hemoglobin A1C Recent Labs  10/22/22 0201  HGBA1C 6.1*   Fasting Lipid Panel No results for input(s): "CHOL", "HDL", "LDLCALC", "TRIG", "CHOLHDL", "LDLDIRECT" in the last 72 hours. Thyroid Function Tests Recent Labs    10/22/22 0201  TSH 5.438*    Other results:   Imaging    No results found.   Medications:     Scheduled Medications:  aspirin  81 mg Oral Daily   azithromycin  500 mg Oral Daily   DULoxetine  20 mg Oral Daily   empagliflozin  10 mg Oral Daily   enoxaparin (LOVENOX) injection  40 mg Subcutaneous Q24H   enoxaparin (LOVENOX) injection  50 mg Subcutaneous QHS   furosemide  20 mg Oral Daily   gabapentin  300 mg Oral TID   losartan  25 mg Oral Daily   pantoprazole  40 mg Oral Daily   sodium chloride flush  3 mL Intravenous Q12H   sodium chloride flush  3 mL Intravenous Q12H   spironolactone  25 mg Oral Daily    Infusions:  sodium chloride     sodium chloride     cefTRIAXone (ROCEPHIN)  IV 2 g (10/22/22 1724)    PRN Medications: sodium chloride, sodium chloride, acetaminophen, acetaminophen, hydrALAZINE, ondansetron (ZOFRAN) IV, ondansetron (ZOFRAN) IV, sodium chloride flush, sodium chloride flush   Assessment/Plan   1. Acute systolic CHF: New diagnosis.   Admitted with CHF, echo this admission with EF < 20%, moderate LV dilation, RV normal, IVC dilated.  No evidence for ACS, mildly elevated TnI with no trend is likely demand ischemia from volume overload.  Possible etiologies include substance abuse (cocaine/ETOH), long-standing HTN, ?myocarditis. Also has wide LBBB. No strong family history of CHF.  Coronary angiography showed no significant coronary disease.  RHC with RA 6, PCWP 15, CI 2.   - Not volume overloaded on exam. Continue Lasix 20 mg po daily.  Can use extra 20 mg as needed after discharge, discussed when to take extra diuretic. - Continue losartan 25 mg daily (no ACEI or ARNI with angioedema on ACEI).  - Continue spironolactone 25 mg daily - Continue Jardiance 10 mg daily.   - Would ideally get cardiac MRI but he has had trouble with claustrophobia in the past.  - Needs to quit ETOH and cocaine.  - With wide LBBB, may be CRT-D candidate in future if he cuts out substance abuse.  2. HTN: Meds as above. BP stable. 3. OSA: Uses CPAP.  4. ETOH/cocaine abuse: Needs to quit, counseled that this could play a role in cardiomyopathy.   Ready for discharge today.   Has f/u scheduled in HF clinic.  Medications at discharge: -Losartan 25 mg daily -Spironolactone 25 mg daily -Jardiance 10 mg daily -Furosemide 20 mg daily, can take extra 20 mg as needed -KCL 20 mEq daily - Coreg 3.125 mg bid -Stop aspirin    Length of Stay: 2  FINCH, LINDSAY N, PA-C  10/23/2022, 11:34 AM  Advanced Heart Failure Team Pager [phone] (M-F; 7a - 5p)  Please contact [facility] for night-coverage after hours (5p -7a ) and weekends on [email or web address]   Patient seen with PA, agree with the above note.   He feels good today, no complaints/no dyspnea.  Creatinine stable 1.25. BP stable.   General: NAD Neck: No JVD, no thyromegaly or thyroid nodule.  Lungs: Clear to auscultation bilaterally with normal respiratory effort. CV: Nondisplaced PMI.  Heart  regular S1/S2, no S3/S4, no murmur.  No peripheral edema.   Abdomen: Soft, nontender, no hepatosplenomegaly,  no distention.  Skin: Intact without lesions or rashes.  Neurologic: Alert and oriented x 3.  Psych: Normal affect. Extremities: No clubbing or cyanosis.  HEENT: Normal.   I think he can go home today.  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 HTN may also play a role.  Unable to get cardiac MRI due to claustrophobia.   Would send home on the above meds (see PA's list). I think he can tolerate addition of low dose Coreg.   Will arrange followup in CHF clinic.   Marca Ancona 10/23/2022 12:57 PM

## 2022-10-27 LAB — LIPOPROTEIN A (LPA): Lipoprotein (a): 134.6 nmol/L — ABNORMAL HIGH (ref <75.0)

## 2022-10-29 NOTE — Progress Notes (Signed)
ADVANCED HF CLINIC CONSULT NOTE  Primary Care: Sherwood Gambler, MD HF Cardiologist: Dr. Shirlee Latch  HPI: 67 y.o. male with history of LBBB (noted on ECGs in 2018), HTN, OSA on CPAP, iron deficiency anemia, GERD, DM, alcohol, THC and cocaine use. [name] most of his care through the [place].   Admitted 8/24 with acute systolic heart failure and PNA. Started on abx. Echo showed EF <20%, RV ok, IVC dilated and estimated RAP 15.  UDS + cocaine and THC. Diuresed with IV lasix. R/LHC showed no significant coronary disease. RHC with near normal filling pressures, and low CI at 2. Unable to complete cMRI with claustrophobia. GDMT titrated, and he was discharged home, weight 221 lbs.  Today he returns for post hospital HF follow up. Overall feeling fine. He has SOB walking up steps. Denies palpitations, CP, dizziness, edema, or PND/Orthopnea. Appetite ok. No fever or chills. Weight at home 220 pounds. Taking all medications, but unsure of med names. 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 Chronic knee/hip OA. No family Hx of CHF. No cocaine since discharge, drank 1 glass of wine since discharge. Has 1 daughter, and 2 grandchildren. Follows at the [place].  ECG (personally reviewed): NSR LBBB, QRS 154 msec  ReDs: 38%  Labs (8/24): K 3.7, creatinine 1.25, Lp(a) 134.6  Cardiac Studies: - R/LHC (8/24): normal cors, RA 6, PA 34/17(25), PCWP 15, CO/Ci (Fick) 4.52/2, PVR 2.3 WU  - Echo (8/24): EF < 20%, RV normal, dilated IVC, estimated RAP 15  Review of Systems: [y] = yes, [ ]  = no   General: Weight gain Cove.Etienne ]; Weight loss [ ] ; Anorexia [ ] ; Fatigue [ ] ; Fever [ ] ; Chills [ ] ; Weakness [ ]   Cardiac: Chest pain/pressure [ ] ; Resting SOB [ ] ; Exertional SOB Cove.Etienne ]; Orthopnea [ ] ; Pedal Edema [ ] ; Palpitations [ ] ; Syncope [ ] ; Presyncope [ ] ; Paroxysmal nocturnal dyspnea[ ]   Pulmonary: Cough [ ] ; Wheezing[ ] ; Hemoptysis[ ] ; Sputum [ ] ; Snoring Cove.Etienne ]  GI:  Vomiting[ ] ; Dysphagia[ ] ; Melena[ ] ; Hematochezia [ ] ; Heartburn[ ] ; Abdominal pain [ ] ; Constipation [ ] ; Diarrhea [ ] ; BRBPR [ ]   GU: Hematuria[ ] ; Dysuria [ ] ; Nocturia[ ]   Vascular: Pain in legs with walking [ ] ; Pain in feet with lying flat [ ] ; Non-healing sores [ ] ; Stroke [ ] ; TIA [ ] ; Slurred speech [ ] ;  Neuro: Headaches[ ] ; Vertigo[ ] ; Seizures[ ] ; Paresthesias[ ] ;Blurred vision [ ] ; Diplopia [ ] ; Vision changes [ ]   Ortho/Skin: Arthritis [ ] ; Joint pain Cove.Etienne ]; Muscle pain [ ] ; Joint swelling [ ] ; Back Pain [ ] ; Rash [ ]   Psych: Depression[ ] ; Anxiety[ ]   Heme: Bleeding problems [ ] ; Clotting disorders [ ] ; Anemia [ ]   Endocrine: Diabetes [ ] ; Thyroid dysfunction[ ]   Past Medical History:  Diagnosis Date   Chronic GERD    Hypertension    Pain    Pre-diabetes    Current Outpatient Medications  Medication Sig Dispense Refill   acetaminophen (TYLENOL) 500 MG tablet Take 500 mg by mouth every 6 (six) hours as needed. joint     cholecalciferol (VITAMIN D) 1000 UNITS tablet Take 1,000 Units by mouth daily.     cycloSPORINE (RESTASIS) 0.05 % ophthalmic emulsion Place 1 drop into both eyes 2 (two) times daily.     diclofenac Sodium (VOLTAREN) 1 % GEL Apply 2 g topically 4 (four) times daily as needed (pain).  DULoxetine (CYMBALTA) 20 MG capsule Take 20 mg by mouth daily.     empagliflozin (JARDIANCE) 10 MG TABS tablet Take 1 tablet (10 mg total) by mouth daily. 90 tablet 0   furosemide (LASIX) 20 MG tablet Take 1 tablet (20 mg total) by mouth daily. 90 tablet 0   gabapentin (NEURONTIN) 300 MG capsule Take 300 mg by mouth 3 (three) times daily.     losartan (COZAAR) 25 MG tablet Take 1 tablet (25 mg total) by mouth daily. 90 tablet 0   OMEPRAZOLE PO Take 20 mg by mouth daily.     potassium chloride SA (KLOR-CON M) 20 MEQ tablet Take 1 tablet (20 mEq total) by mouth daily. 90 tablet 0   sildenafil (VIAGRA) 100 MG tablet Take 100 mg by mouth as needed for erectile dysfunction.      spironolactone (ALDACTONE) 25 MG tablet Take 1 tablet (25 mg total) by mouth daily. 90 tablet 0   No current facility-administered medications for this encounter.   Allergies  Allergen Reactions   Lisinopril Swelling   Shellfish Allergy    Social History   Socioeconomic History   Marital status: Single    Spouse name: Not on file   Number of children: 1   Years of education: Not on file   Highest education level: Not on file  Occupational History   Occupation: retired  Tobacco Use   Smoking status: Former    Types: Cigarettes   Smokeless tobacco: Never   Tobacco comments:    Not able to remember when he quit  Vaping Use   Vaping status: Never Used  Substance and Sexual Activity   Alcohol use: Yes    Alcohol/week: 1.0 standard drink of alcohol    Types: 1 Cans of beer per week    Comment: social   Drug use: Yes    Frequency: 1.0 times per week    Types: Marijuana   Sexual activity: Not Currently  Other Topics Concern   Not on file  Social History Narrative   Not on file   Social Determinants of Health   Financial Resource Strain: Not on file  Food Insecurity: No Food Insecurity (10/20/2022)   Hunger Vital Sign    Worried About Running Out of Food in the Last Year: Never true    Ran Out of Food in the Last Year: Never true  Transportation Needs: No Transportation Needs (10/20/2022)   PRAPARE - Administrator, Civil Service (Medical): No    Lack of Transportation (Non-Medical): No  Physical Activity: Not on file  Stress: Not on file  Social Connections: Unknown (07/03/2021)   [facility] from [facility]   Social Network    Social Network: Not on file  Intimate Partner Violence: Not At Risk (10/20/2022)   Humiliation, Afraid, Rape, and Kick questionnaire    Fear of Current or Ex-Partner: No    Emotionally Abused: No    Physically Abused: No    Sexually Abused: No   Family History  Problem Relation Age of Onset   Stroke Mother     Other Father        patient was a kid when his Dad died, not sure of the cause   Colon cancer Neg Hx    Esophageal cancer Neg Hx    BP 138/84   Pulse 89   Wt 104.2 kg (229 lb 12.8 oz)   SpO2 96%   BMI 30.33 kg/m   Wt Readings from Last  3 Encounters:  11/02/22 104.2 kg (229 lb 12.8 oz)  10/23/22 100.5 kg (221 lb 9.6 oz)  10/20/22 106.6 kg (235 lb)   PHYSICAL EXAM: General:  NAD. No resp difficulty, walked into clinic HEENT: Normal Neck: Supple. No JVD. Carotids 2+ bilat; no bruits. No lymphadenopathy or thryomegaly appreciated. Cor: PMI nondisplaced. Regular rate & rhythm. No rubs, gallops or murmurs. Lungs: Clear Abdomen: Soft, nontender, nondistended. No hepatosplenomegaly. No bruits or masses. Good bowel sounds. Extremities: No cyanosis, clubbing, rash, edema Neuro: Alert & oriented x 3, cranial nerves grossly intact. Moves all 4 extremities w/o difficulty. Affect pleasant.  ASSESSMENT & PLAN: 1. Chronic systolic CHF: New diagnosis.  Admitted with CHF, echo (8/24): EF < 20%, moderate LV dilation, RV normal, IVC dilated.  No evidence for ACS, mildly elevated TnI with no trend is likely demand ischemia from volume overload.  Possible etiologies include substance abuse (cocaine/ETOH), long-standing HTN, ? myocarditis. Also has wide LBBB. No strong family history of CHF.  Coronary angiography showed no significant coronary disease; RHC with RA 6, PCWP 15, CI 2.  NYHA II, weight up a bit from discharge but he is not volume overloaded on exam, ReDs 38% (suspect body habitus contributing to elevated reading) - Start Coreg 3.125 mg bid. - Continue Lasix 20 mg daily + 20 KCL daily. - Continue losartan 25 mg daily (no ACEI or ARNI with angioedema on ACEi). BMET and BNP today - Continue spironolactone 25 mg daily. - Continue Jardiance 10 mg daily.   - Would ideally get cardiac MRI but he has had trouble with claustrophobia in the past. I offered Valium as pre-med, he will think about it. -  Needs to quit ETOH and cocaine.  - With wide LBBB, may be CRT-D candidate in future if he can stop substance abuse.  2. HTN: BP elevated. - Start Coreg as above. 3. OSA: Uses CPAP.  4. ETOH/cocaine abuse: Counseled that this could play a role in cardiomyopathy.  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 Not currently on a statin. May benefit from statin vs PCSK9i - Check lipids today to have baseline.   Follow up in 4 weeks with APP and 12 weeks with Dr. Shirlee Latch + echo. I asked him to bring all of his meds to his next appt.  Prince Rome, FNP-BC 11/02/22

## 2022-11-02 ENCOUNTER — Ambulatory Visit (HOSPITAL_COMMUNITY)
Admit: 2022-11-02 | Discharge: 2022-11-02 | Disposition: A | Discharge status: Home / Self Care | Payer: Medicare Other | Source: Ambulatory Visit | Attending: Family Medicine | Admitting: Family Medicine

## 2022-11-02 ENCOUNTER — Encounter (HOSPITAL_COMMUNITY): Payer: Self-pay

## 2022-11-02 VITALS — BP 138/84 | HR 89 | Wt 229.8 lb

## 2022-11-02 DIAGNOSIS — F1010 Alcohol abuse, uncomplicated: Secondary | ICD-10-CM | POA: Insufficient documentation

## 2022-11-02 DIAGNOSIS — E7841 Elevated Lipoprotein(a): Secondary | ICD-10-CM | POA: Insufficient documentation

## 2022-11-02 DIAGNOSIS — I110 Hypertensive heart disease with heart failure: Secondary | ICD-10-CM | POA: Insufficient documentation

## 2022-11-02 DIAGNOSIS — I10 Essential (primary) hypertension: Secondary | ICD-10-CM | POA: Diagnosis not present

## 2022-11-02 DIAGNOSIS — I447 Left bundle-branch block, unspecified: Secondary | ICD-10-CM | POA: Insufficient documentation

## 2022-11-02 DIAGNOSIS — G4733 Obstructive sleep apnea (adult) (pediatric): Secondary | ICD-10-CM | POA: Diagnosis not present

## 2022-11-02 DIAGNOSIS — F1410 Cocaine abuse, uncomplicated: Secondary | ICD-10-CM | POA: Diagnosis not present

## 2022-11-02 DIAGNOSIS — I5022 Chronic systolic (congestive) heart failure: Secondary | ICD-10-CM | POA: Diagnosis not present

## 2022-11-02 LAB — BASIC METABOLIC PANEL
Anion gap: 11 (ref 5–15)
BUN: 16 mg/dL (ref 8–23)
CO2: 20 mmol/L — ABNORMAL LOW (ref 22–32)
Calcium: 9.5 mg/dL (ref 8.9–10.3)
Chloride: 104 mmol/L (ref 98–111)
Creatinine, Ser: 1.27 mg/dL — ABNORMAL HIGH (ref 0.61–1.24)
GFR, Estimated: >60 mL/min (ref >60)
Glucose, Bld: 115 mg/dL — ABNORMAL HIGH (ref 70–99)
Potassium: 4.3 mmol/L (ref 3.5–5.1)
Sodium: 135 mmol/L (ref 135–145)

## 2022-11-02 LAB — BRAIN NATRIURETIC PEPTIDE: B Natriuretic Peptide: 468.9 pg/mL — ABNORMAL HIGH (ref 0.0–100.0)

## 2022-11-02 LAB — LIPID PANEL
Cholesterol: 185 mg/dL (ref 0–200)
HDL: 40 mg/dL — ABNORMAL LOW (ref >40)
LDL Cholesterol: 111 mg/dL — ABNORMAL HIGH (ref 0–99)
Total CHOL/HDL Ratio: 4.6 ratio
Triglycerides: 171 mg/dL — ABNORMAL HIGH (ref <150)
VLDL: 34 mg/dL (ref 0–40)

## 2022-11-02 MED ORDER — CARVEDILOL 3.125 MG PO TABS: 3.1250 mg | ORAL_TABLET | Freq: Two times a day (BID) | ORAL | 6 refills | Status: DC

## 2022-11-02 MED ORDER — POTASSIUM CHLORIDE CRYS ER 20 MEQ PO TBCR: 20.0000 meq | EXTENDED_RELEASE_TABLET | Freq: Every day | ORAL | 3 refills | Status: DC

## 2022-11-02 MED ORDER — EMPAGLIFLOZIN 25 MG PO TABS
ORAL_TABLET | ORAL | 3 refills | Status: DC
Start: 2022-11-02 — End: 2022-11-04

## 2022-11-02 MED ORDER — SPIRONOLACTONE 25 MG PO TABS
25.0000 mg | ORAL_TABLET | Freq: Every day | ORAL | 3 refills | Status: DC
Start: 2022-11-02 — End: 2022-11-04

## 2022-11-02 MED ORDER — FUROSEMIDE 20 MG PO TABS
20.0000 mg | ORAL_TABLET | Freq: Every day | ORAL | 3 refills | Status: DC
Start: 2022-11-02 — End: 2022-11-02

## 2022-11-02 MED ORDER — EMPAGLIFLOZIN 10 MG PO TABS: 10.0000 mg | ORAL_TABLET | Freq: Every day | ORAL | 3 refills | Status: DC

## 2022-11-02 MED ORDER — FUROSEMIDE 20 MG PO TABS: 20.0000 mg | ORAL_TABLET | Freq: Every day | ORAL | 3 refills | Status: DC

## 2022-11-02 MED ORDER — LOSARTAN POTASSIUM 25 MG PO TABS
25.0000 mg | ORAL_TABLET | Freq: Every day | ORAL | 3 refills | Status: DC
Start: 2022-11-02 — End: 2022-11-04

## 2022-11-02 MED ORDER — LOSARTAN POTASSIUM 25 MG PO TABS
25.0000 mg | ORAL_TABLET | Freq: Every day | ORAL | 3 refills | Status: DC
Start: 2022-11-02 — End: 2022-11-02

## 2022-11-02 MED ORDER — CARVEDILOL 3.125 MG PO TABS
3.1250 mg | ORAL_TABLET | Freq: Two times a day (BID) | ORAL | 6 refills | Status: DC
Start: 2022-11-02 — End: 2022-11-02

## 2022-11-02 MED ORDER — SPIRONOLACTONE 25 MG PO TABS
25.0000 mg | ORAL_TABLET | Freq: Every day | ORAL | 3 refills | Status: DC
Start: 2022-11-02 — End: 2022-11-02

## 2022-11-02 NOTE — Addendum Note (Signed)
Encounter addended by: Levonne Spiller, RN on: 11/02/2022 3:16 PM  Actions taken: Pharmacy for encounter modified, Order list changed, Diagnosis association updated, Clinical Note Signed

## 2022-11-02 NOTE — Patient Instructions (Addendum)
Start Coreg 3.215 mg twice daily - Rx sent to [facility] [place] pharmacy. Labs today - will call you if abnormal.  Referral sent to [facility] - see below. [facility]. Return to Heart Failure APP Clinic in 4 weeks - see below. Return to see Dr. Shirlee Latch in Heart Failure Clinic in 12 weeks with echo - see below. Please call us at [phone] if any questions or concerns prior to your next visit.   Please bring your medications to your next clinic visit. It is very important that we know what you are taking.

## 2022-11-02 NOTE — Progress Notes (Signed)
ReDS Vest / Clip - 11/02/22 1415       ReDS Vest / Clip   Station Marker D    Ruler Value 36    ReDS Value Range Moderate volume overload    ReDS Actual Value 38    Anatomical Comments sitting

## 2022-11-03 ENCOUNTER — Telehealth (HOSPITAL_COMMUNITY): Payer: Self-pay

## 2022-11-03 ENCOUNTER — Other Ambulatory Visit (HOSPITAL_COMMUNITY): Payer: Self-pay

## 2022-11-03 ENCOUNTER — Telehealth (HOSPITAL_COMMUNITY): Payer: Self-pay | Admitting: *Deleted

## 2022-11-03 DIAGNOSIS — I5022 Chronic systolic (congestive) heart failure: Secondary | ICD-10-CM

## 2022-11-03 MED ORDER — FUROSEMIDE 20 MG PO TABS: 40.0000 mg | ORAL_TABLET | Freq: Every day | ORAL | 11 refills | Status: DC

## 2022-11-03 MED ORDER — POTASSIUM CHLORIDE CRYS ER 20 MEQ PO TBCR: 20.0000 meq | EXTENDED_RELEASE_TABLET | Freq: Every day | ORAL | 3 refills | Status: DC

## 2022-11-03 NOTE — Telephone Encounter (Signed)
-----   Message from Jacklynn Ganong sent at 11/02/2022  4:48 PM EDT ----- BNP elevated. Increase Lasix to 40 mg daily, add 20 KCL daily. Repeat BMET in 10 days

## 2022-11-03 NOTE — Telephone Encounter (Signed)
Received referral from Dr. Shirlee Latch for this pt to participate in Cardiac Rehab with the diagnosis of Chronic Systolic heart failure.  Pt seen in post hospitalization follow up on 9/9. Pt was hospitalized 8/27-8/30.  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 Looked for any authorization for the service of cardiac rehab through care in the community. Could not locate one.  Called pt to determine interest in a group exercise program and upcoming appt with primary care through the [place] to ask for [facility] authorization for Cardiac rehab.  Message left. Unclear if he has another source of medical insurance. Alanson Aly, BSN Cardiac and Emergency planning/management officer

## 2022-11-03 NOTE — Telephone Encounter (Signed)
Patient's med list has been changed and updated.in pt's chart. In addition his labs has been ordered and his appointment scheduled. Pt aware, agreeable, and verbalized understanding.

## 2022-11-04 ENCOUNTER — Other Ambulatory Visit (HOSPITAL_COMMUNITY): Payer: Self-pay

## 2022-11-04 DIAGNOSIS — I5022 Chronic systolic (congestive) heart failure: Secondary | ICD-10-CM

## 2022-11-04 MED ORDER — SPIRONOLACTONE 25 MG PO TABS
25.0000 mg | ORAL_TABLET | Freq: Every day | ORAL | 3 refills | Status: DC
Start: 2022-11-04 — End: 2024-01-10

## 2022-11-04 MED ORDER — CARVEDILOL 3.125 MG PO TABS: 3.1250 mg | ORAL_TABLET | Freq: Two times a day (BID) | ORAL | 6 refills | Status: DC

## 2022-11-04 MED ORDER — LOSARTAN POTASSIUM 25 MG PO TABS
25.0000 mg | ORAL_TABLET | Freq: Every day | ORAL | 3 refills | Status: DC
Start: 2022-11-04 — End: 2023-02-01

## 2022-11-04 MED ORDER — EMPAGLIFLOZIN 25 MG PO TABS: ORAL_TABLET | ORAL | 3 refills | Status: DC

## 2022-11-13 ENCOUNTER — Ambulatory Visit (HOSPITAL_COMMUNITY)
Admission: RE | Admit: 2022-11-13 | Discharge: 2022-11-13 | Disposition: A | Discharge status: Home / Self Care | Payer: Non-veteran care | Source: Ambulatory Visit | Attending: Family Medicine | Admitting: Family Medicine

## 2022-11-13 DIAGNOSIS — I5022 Chronic systolic (congestive) heart failure: Secondary | ICD-10-CM | POA: Diagnosis present

## 2022-11-13 LAB — BASIC METABOLIC PANEL WITH GFR
Anion gap: 9 (ref 5–15)
BUN: 23 mg/dL (ref 8–23)
CO2: 23 mmol/L (ref 22–32)
Calcium: 9.3 mg/dL (ref 8.9–10.3)
Chloride: 104 mmol/L (ref 98–111)
Creatinine, Ser: 1.31 mg/dL — ABNORMAL HIGH (ref 0.61–1.24)
GFR, Estimated: 59 mL/min — ABNORMAL LOW
Glucose, Bld: 122 mg/dL — ABNORMAL HIGH (ref 70–99)
Potassium: 3.9 mmol/L (ref 3.5–5.1)
Sodium: 136 mmol/L (ref 135–145)

## 2022-11-23 ENCOUNTER — Other Ambulatory Visit: Payer: Self-pay | Admitting: Physician Assistant

## 2022-11-23 ENCOUNTER — Other Ambulatory Visit (HOSPITAL_COMMUNITY): Payer: Self-pay

## 2022-11-23 DIAGNOSIS — D509 Iron deficiency anemia, unspecified: Secondary | ICD-10-CM

## 2022-11-24 ENCOUNTER — Inpatient Hospital Stay: Payer: Medicare Other | Attending: Physician Assistant

## 2022-11-24 ENCOUNTER — Inpatient Hospital Stay (HOSPITAL_BASED_OUTPATIENT_CLINIC_OR_DEPARTMENT_OTHER): Payer: Medicare Other | Admitting: Physician Assistant

## 2022-11-24 VITALS — BP 130/87 | HR 81 | Temp 97.5°F | Resp 15 | Wt 230.4 lb

## 2022-11-24 DIAGNOSIS — D509 Iron deficiency anemia, unspecified: Secondary | ICD-10-CM | POA: Insufficient documentation

## 2022-11-24 DIAGNOSIS — Z87891 Personal history of nicotine dependence: Secondary | ICD-10-CM | POA: Insufficient documentation

## 2022-11-24 DIAGNOSIS — I509 Heart failure, unspecified: Secondary | ICD-10-CM | POA: Diagnosis not present

## 2022-11-24 LAB — CBC WITH DIFFERENTIAL (CANCER CENTER ONLY)
Abs Immature Granulocytes: 0.02 10*3/uL (ref 0.00–0.07)
Basophils Absolute: 0 10*3/uL (ref 0.0–0.1)
Basophils Relative: 1 %
Eosinophils Absolute: 0.2 10*3/uL (ref 0.0–0.5)
Eosinophils Relative: 4 %
HCT: 41.5 % (ref 39.0–52.0)
Hemoglobin: 14.2 g/dL (ref 13.0–17.0)
Immature Granulocytes: 0 %
Lymphocytes Relative: 23 %
Lymphs Abs: 1.1 10*3/uL (ref 0.7–4.0)
MCH: 28.7 pg (ref 26.0–34.0)
MCHC: 34.2 g/dL (ref 30.0–36.0)
MCV: 83.8 fL (ref 80.0–100.0)
Monocytes Absolute: 0.7 10*3/uL (ref 0.1–1.0)
Monocytes Relative: 14 %
Neutro Abs: 2.9 10*3/uL (ref 1.7–7.7)
Neutrophils Relative %: 58 %
Platelet Count: 243 10*3/uL (ref 150–400)
RBC: 4.95 MIL/uL (ref 4.22–5.81)
RDW: 13 % (ref 11.5–15.5)
WBC Count: 5 10*3/uL (ref 4.0–10.5)
nRBC: 0 % (ref 0.0–0.2)

## 2022-11-24 LAB — IRON AND IRON BINDING CAPACITY (CC-WL,HP ONLY)
Iron: 78 ug/dL (ref 45–182)
Saturation Ratios: 15 % — ABNORMAL LOW (ref 17.9–39.5)
TIBC: 532 ug/dL — ABNORMAL HIGH (ref 250–450)
UIBC: 454 ug/dL — ABNORMAL HIGH (ref 117–376)

## 2022-11-24 LAB — FERRITIN: Ferritin: 33 ng/mL (ref 24–336)

## 2022-11-24 NOTE — Progress Notes (Unsigned)
[facility] [phone]   [phone]  PROGRESS NOTE  Patient Care Team: Sherwood Gambler, MD as PCP - General (Internal Medicine)  Hematological/Oncological History 03/03/2022: Labs from [place]: WBC 6.41, Hgb 7.9 (L), MCV 63.8 (L), Plt 349, Iron 17 (L), TIBC 591 (H), Ferritin 9.9 (L), Saturation 2.9% (L). 03/27/2022: Establish care with [facility] Hematology 04/07/2022: Received IV monoferric 1000 mg x 1 dose 06/03/2022: Received IV monoferric 1000 mg x 1 dose  CHIEF COMPLAINTS/PURPOSE OF CONSULTATION:  Iron deficiency anemia.  HISTORY OF PRESENTING ILLNESS:  Maxwell Harris 67 y.o. male returns for a follow up for iron deficiency anemia.  He was last seen on 05/22/2022. In the interim, he received IV monoferric x 1 dose on 06/03/2022. In addition, he was admitted from 10/20/2022-10/23/2022 for newly diagnosed congestive heart failure. He is unaccompanied for this visit.  On exam today, Maxwell Harris his energy levels have improved since recent hospitalization. He Harris his appetite is stable. He denies nausea, vomiting or bowel habit changes. He denies easy bruising or signs of bleeding. He denies any swelling at this time. He has occasional shortness of breath mainly with exertion. Patient denies fevers, chills or sweats, chest pain or cough.  He has no other complaints.  Rest of the 10 point ROS is below.   MEDICAL HISTORY:  Past Medical History:  Diagnosis Date   Chronic GERD    Hypertension    Pain    Pre-diabetes     SURGICAL HISTORY: Past Surgical History:  Procedure Laterality Date   COLONOSCOPY     ESOPHAGOGASTRODUODENOSCOPY     RIGHT/LEFT HEART CATH AND CORONARY ANGIOGRAPHY N/A 10/22/2022   Procedure: RIGHT/LEFT HEART CATH AND CORONARY ANGIOGRAPHY;  Surgeon: Laurey Morale, MD;  Location: [facility] INVASIVE CV LAB;  Service: Cardiovascular;  Laterality: N/A;    SOCIAL HISTORY: Social History   Socioeconomic History   Marital status:  Single    Spouse name: Not on file   Number of children: 1   Years of education: Not on file   Highest education level: Not on file  Occupational History   Occupation: retired  Tobacco Use   Smoking status: Former    Types: Cigarettes   Smokeless tobacco: Never   Tobacco comments:    Not able to remember when he quit  Vaping Use   Vaping status: Never Used  Substance and Sexual Activity   Alcohol use: Yes    Alcohol/week: 1.0 standard drink of alcohol    Types: 1 Cans of beer per week    Comment: social   Drug use: Yes    Frequency: 1.0 times per week    Types: Marijuana   Sexual activity: Not Currently  Other Topics Concern   Not on file  Social History Narrative   Not on file   Social Determinants of Health   Financial Resource Strain: Not on file  Food Insecurity: No Food Insecurity (10/20/2022)   Hunger Vital Sign    Worried About Running Out of Food in the Last Year: Never true    Ran Out of Food in the Last Year: Never true  Transportation Needs: No Transportation Needs (10/20/2022)   PRAPARE - Administrator, Civil Service (Medical): No    Lack of Transportation (Non-Medical): No  Physical Activity: Not on file  Stress: Not on file  Social Connections: Unknown (07/03/2021)   Received from [facility], [facility]   Social Network    Social Network: Not  on file  Intimate Partner Violence: Not At Risk (10/20/2022)   Humiliation, Afraid, Rape, and Kick questionnaire    Fear of Current or Ex-Partner: No    Emotionally Abused: No    Physically Abused: No    Sexually Abused: No    FAMILY HISTORY: Family History  Problem Relation Age of Onset   Stroke Mother    Other Father        patient was a kid when his Dad died, not sure of the cause   Colon cancer Neg Hx    Esophageal cancer Neg Hx     ALLERGIES:  is allergic to lisinopril and shellfish allergy.  MEDICATIONS:  Current Outpatient Medications  Medication Sig Dispense Refill    acetaminophen (TYLENOL) 500 MG tablet Take 500 mg by mouth every 6 (six) hours as needed. joint     carvedilol (COREG) 3.125 MG tablet Take 1 tablet (3.125 mg total) by mouth 2 (two) times daily with a meal. 60 tablet 6   cholecalciferol (VITAMIN D) 1000 UNITS tablet Take 1,000 Units by mouth daily.     cycloSPORINE (RESTASIS) 0.05 % ophthalmic emulsion Place 1 drop into both eyes 2 (two) times daily.     diclofenac Sodium (VOLTAREN) 1 % GEL Apply 2 g topically 4 (four) times daily as needed (pain).     DULoxetine (CYMBALTA) 20 MG capsule Take 20 mg by mouth daily.     empagliflozin (JARDIANCE) 25 MG TABS tablet Take 1/2 tablet daily 45 tablet 3   furosemide (LASIX) 20 MG tablet Take 2 tablets (40 mg total) by mouth daily. 60 tablet 11   gabapentin (NEURONTIN) 300 MG capsule Take 300 mg by mouth 3 (three) times daily.     lidocaine (LIDODERM) 5 % Place onto the skin.     losartan (COZAAR) 25 MG tablet Take 1 tablet (25 mg total) by mouth daily. 90 tablet 3   OMEPRAZOLE PO Take 20 mg by mouth daily.     potassium chloride SA (KLOR-CON M) 20 MEQ tablet Take 1 tablet (20 mEq total) by mouth daily. 90 tablet 3   sildenafil (VIAGRA) 100 MG tablet Take 100 mg by mouth as needed for erectile dysfunction.     spironolactone (ALDACTONE) 25 MG tablet Take 1 tablet (25 mg total) by mouth daily. 90 tablet 3   No current facility-administered medications for this visit.    REVIEW OF SYSTEMS:   Constitutional: ( - ) fevers, ( - )  chills , ( - ) night sweats Eyes: ( - ) blurriness of vision, ( - ) double vision, ( - ) watery eyes Ears, nose, mouth, throat, and face: ( - ) mucositis, ( - ) sore throat Respiratory: ( - ) cough, (-) dyspnea, ( - ) wheezes Cardiovascular: ( - ) palpitation, ( - ) chest discomfort, ( - ) lower extremity swelling Gastrointestinal:  ( - ) nausea, ( - ) heartburn, ( - ) change in bowel habits Skin: ( - ) abnormal skin rashes Lymphatics: ( - ) new lymphadenopathy, ( - ) easy  bruising Neurological: ( - ) numbness, ( - ) tingling, ( - ) new weaknesses Behavioral/Psych: ( - ) mood change, ( - ) new changes  All other systems were reviewed with the patient and are negative.  PHYSICAL EXAMINATION: ECOG PERFORMANCE STATUS: 0 - Asymptomatic  Vitals:   11/24/22 1404  BP: 130/87  Pulse: 81  Resp: 15  Temp: (!) 97.5 F (36.4 C)  SpO2: 93%  Filed Weights   11/24/22 1404  Weight: 230 lb 6.4 oz (104.5 kg)     GENERAL: well appearing male in NAD  SKIN: skin color, texture, turgor are normal, no rashes or significant lesions EYES: conjunctiva are pink and non-injected, sclera clear LUNGS: clear to auscultation and percussion with normal breathing effort HEART: regular rate & rhythm and no murmurs and no lower extremity edema Musculoskeletal: no cyanosis of digits and no clubbing  PSYCH: alert & oriented x 3, fluent speech NEURO: no focal motor/sensory deficits  LABORATORY DATA:  I have reviewed the data as listed    Latest Ref Rng & Units 11/24/2022    1:35 PM 10/23/2022    3:07 AM 10/22/2022    8:36 AM  CBC  WBC 4.0 - 10.5 K/uL 5.0  5.9    Hemoglobin 13.0 - 17.0 g/dL 16.1  09.6  04.5   Hematocrit 39.0 - 52.0 % 41.5  41.4  40.0   Platelets 150 - 400 K/uL 243  285         Latest Ref Rng & Units 11/13/2022   10:53 AM 11/02/2022    3:18 PM 10/23/2022    3:07 AM  CMP  Glucose 70 - 99 mg/dL 409  811  914   BUN 8 - 23 mg/dL 23  16  16    Creatinine 0.61 - 1.24 mg/dL 7.82  9.56  2.13   Sodium 135 - 145 mmol/L 136  135  138   Potassium 3.5 - 5.1 mmol/L 3.9  4.3  3.7   Chloride 98 - 111 mmol/L 104  104  102   CO2 22 - 32 mmol/L 23  20  24    Calcium 8.9 - 10.3 mg/dL 9.3  9.5  9.9    ASSESSMENT & PLAN KYLO GAVIN is a 67 y.o. male who presents to for a follow up for iron deficiency anemia.    # Iron deficiency anemia: --Underwent EGD and colonoscopy on 07/28/2022 with Dr Tomasa Rand. Colonoscopy showed one polyp in distal sigmoid colon, removed and  mild diverticulosis in descending colon without bleeding. EGD was unremarkable.  --Patient denies any signs of bleeding.  --Last received IV monoferric 1000 mg x 1 dose on 06/03/2022.  --Unable to tolerate p.o. iron supplementation due to constipation. --Labs today show no evidence of anemia with Hgb 14.2. Iron panel shows saturation 15%, TIBC 532, ferritin 33 --Recommend IV iron to bolster levels.  --RTC in 6 months with repeat labs.   No orders of the defined types were placed in this encounter.   All questions were answered. The patient knows to call the clinic with any problems, questions or concerns.  I have spent a total of 30 minutes minutes of face-to-face and non-face-to-face time, preparing to see the patient,  performing a medically appropriate examination, counseling and educating the patient, ordering medications/tests/procedures, documenting clinical information in the electronic health record,  and care coordination.   Georga Kaufmann, PA-C Department of Hematology/Oncology [facility] at [facility] Phone: [phone]

## 2022-11-25 ENCOUNTER — Telehealth (HOSPITAL_COMMUNITY): Payer: Self-pay

## 2022-11-25 NOTE — Telephone Encounter (Signed)
No response from pt in regards to cardiac rehab. Closed referral 

## 2022-11-26 ENCOUNTER — Telehealth: Payer: Self-pay

## 2022-11-26 ENCOUNTER — Telehealth: Payer: Self-pay | Admitting: *Deleted

## 2022-11-26 ENCOUNTER — Encounter: Payer: Self-pay | Admitting: Physician Assistant

## 2022-11-26 NOTE — Telephone Encounter (Signed)
Auth Submission: NO AUTH NEEDED Site of care: Site of care: [facility] Payer: [employer] medicare Medication & CPT/J Code(s) submitted: Feraheme (ferumoxytol) [id number] Route of submission (phone, fax, portal): portal Phone # Fax # Auth type: Buy/Bill PB Units/visits requested: 510mg  x 1 dose Reference number: [id number] Approval from: 11/26/22 to 02/23/24   I confirmed on the [facility] portal that Feraheme does not need a prior authorization for this patient. He has a community of care form from the [place] on file to get treatment at the [facility] infusion center through 03/27/23.

## 2022-11-26 NOTE — Telephone Encounter (Signed)
PC to patient, informed him of message below, [facility] infusion center will be contacting him to set up appointments for iron infusions.  He verbalizes understanding.

## 2022-11-26 NOTE — Telephone Encounter (Signed)
-----   Message from Briant Cedar sent at 11/26/2022 10:32 AM EDT ----- Please notify patient that iron levels are low so recommend IV iron to bolster levels. Will order IV iron at [facility] who will contact him for scheduling. ----- Message ----- From: Leory Plowman, Lab In [place] Sent: 11/24/2022   1:46 PM EDT To: Briant Cedar, PA-C

## 2022-11-30 NOTE — Progress Notes (Signed)
ADVANCED HF CLINIC NOTE  Primary Care: Sherwood Gambler, MD HF Cardiologist: Dr. Shirlee Latch  HPI: 67 y.o. male with history of LBBB (noted on ECGs in 2018), HTN, OSA on CPAP, iron deficiency anemia, GERD, DM, alcohol, THC and cocaine use. Maxwell Harris most of his care through the [place].   Admitted 8/24 with acute systolic heart failure and PNA. Started on abx. Echo showed EF <20%, RV ok, IVC dilated and estimated RAP 15.  UDS + cocaine and THC. Diuresed with IV lasix. R/LHC showed no significant coronary disease. RHC with near normal filling pressures, and low CI at 2. Unable to complete cMRI with claustrophobia. GDMT titrated, and he was discharged home, weight 221 lbs.  Today he returns for HF follow up. Overall feeling more SOB walking on flat ground. He is drinking > 2 L fluid/day. Did not notice significantly more urination on doubled dose of Lasix. Denies palpitations, CP, dizziness, edema, or PND/Orthopnea. Appetite ok. No fever or chills. Weight at home 222 pounds. Taking all medications.  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 Chronic knee/hip OA. No family Hx of CHF. No cocaine since discharge, back to drinking daily 1-2 glasses/day of wine. Smokes THC daily. Has 1 daughter, and 2 grandchildren. Follows at the [place].  ECG (personally reviewed): none ordered today.  ReDs: 38-->46%  Labs (8/24): K 3.7, creatinine 1.25, Lp(a) 134.6 Labs (9/24): K 3.9, creatinine 1.31, LDL 111  Cardiac Studies: - R/LHC (8/24): normal cors, RA 6, PA 34/17(25), PCWP 15, CO/Ci (Fick) 4.52/2, PVR 2.3 WU  - Echo (8/24): EF < 20%, RV normal, dilated IVC, estimated RAP 15  Past Medical History:  Diagnosis Date   Chronic GERD    Hypertension    Pain    Pre-diabetes    Current Outpatient Medications  Medication Sig Dispense Refill   acetaminophen (TYLENOL) 500 MG tablet Take 500 mg by mouth every 6 (six) hours as needed. joint     carvedilol  (COREG) 3.125 MG tablet Take 1 tablet (3.125 mg total) by mouth 2 (two) times daily with a meal. 60 tablet 6   cholecalciferol (VITAMIN D) 1000 UNITS tablet Take 1,000 Units by mouth daily.     cycloSPORINE (RESTASIS) 0.05 % ophthalmic emulsion Place 1 drop into both eyes 2 (two) times daily.     diclofenac Sodium (VOLTAREN) 1 % GEL Apply 2 g topically 4 (four) times daily as needed (pain).     DULoxetine (CYMBALTA) 20 MG capsule Take 20 mg by mouth daily.     empagliflozin (JARDIANCE) 25 MG TABS tablet Take 1/2 tablet daily 45 tablet 3   furosemide (LASIX) 20 MG tablet Take 2 tablets (40 mg total) by mouth daily. 60 tablet 11   gabapentin (NEURONTIN) 300 MG capsule Take 300 mg by mouth 3 (three) times daily.     lidocaine (LIDODERM) 5 % Place onto the skin.     losartan (COZAAR) 25 MG tablet Take 1 tablet (25 mg total) by mouth daily. 90 tablet 3   OMEPRAZOLE PO Take 20 mg by mouth daily.     potassium chloride SA (KLOR-CON M) 20 MEQ tablet Take 1 tablet (20 mEq total) by mouth daily. 90 tablet 3   sildenafil (VIAGRA) 100 MG tablet Take 100 mg by mouth as needed for erectile dysfunction.     spironolactone (ALDACTONE) 25 MG tablet Take 1 tablet (25 mg total) by mouth daily. 90 tablet 3   No current facility-administered medications for  this encounter.   Facility-Administered Medications Ordered in Other Encounters  Medication Dose Route Frequency Provider Last Rate Last Admin   ferumoxytol (FERAHEME) 510 mg in sodium chloride 0.9 % 100 mL IVPB  510 mg Intravenous Once Thayil, Irene T, PA-C       Allergies  Allergen Reactions   Lisinopril Swelling   Shellfish Allergy    Social History   Socioeconomic History   Marital status: Single    Spouse name: Not on file   Number of children: 1   Years of education: Not on file   Highest education level: Not on file  Occupational History   Occupation: retired  Tobacco Use   Smoking status: Former    Types: Cigarettes   Smokeless tobacco:  Never   Tobacco comments:    Not able to remember when he quit  Vaping Use   Vaping status: Never Used  Substance and Sexual Activity   Alcohol use: Yes    Alcohol/week: 1.0 standard drink of alcohol    Types: 1 Cans of beer per week    Comment: social   Drug use: Yes    Frequency: 1.0 times per week    Types: Marijuana   Sexual activity: Not Currently  Other Topics Concern   Not on file  Social History Narrative   Not on file   Social Determinants of Health   Financial Resource Strain: Not on file  Food Insecurity: No Food Insecurity (10/20/2022)   Hunger Vital Sign    Worried About Running Out of Food in the Last Year: Never true    Ran Out of Food in the Last Year: Never true  Transportation Needs: No Transportation Needs (10/20/2022)   PRAPARE - Administrator, Civil Service (Medical): No    Lack of Transportation (Non-Medical): No  Physical Activity: Not on file  Stress: Not on file  Social Connections: Unknown (07/03/2021)   Received from [facility]   Social Network    Social Network: Not on file  Intimate Partner Violence: Not At Risk (10/20/2022)   Humiliation, Afraid, Rape, and Kick questionnaire    Fear of Current or Ex-Partner: No    Emotionally Abused: No    Physically Abused: No    Sexually Abused: No   Family History  Problem Relation Age of Onset   Stroke Mother    Other Father        patient was a kid when his Dad died, not sure of the cause   Colon cancer Neg Hx    Esophageal cancer Neg Hx    BP 134/82   Pulse 69   Wt 105.8 kg (233 lb 3.2 oz)   SpO2 99%   BMI 30.77 kg/m   Wt Readings from Last 3 Encounters:  12/01/22 105.8 kg (233 lb 3.2 oz)  11/24/22 104.5 kg (230 lb 6.4 oz)  11/02/22 104.2 kg (229 lb 12.8 oz)   PHYSICAL EXAM: General:  NAD. No resp difficulty HEENT: Normal Neck: Supple. JVP to jaw. Carotids 2+ bilat; no bruits. No lymphadenopathy or thryomegaly appreciated. Cor: PMI nondisplaced. Regular  rate & rhythm. No rubs, gallops or murmurs. Lungs: Crackles LLL Abdomen: Soft, nontender, +distended. No hepatosplenomegaly. No bruits or masses. Good bowel sounds. Extremities: No cyanosis, clubbing, rash, edema Neuro: Alert & oriented x 3, cranial nerves grossly intact. Moves all 4 extremities w/o difficulty. Affect pleasant.  ASSESSMENT & PLAN: 1. Chronic systolic CHF: New diagnosis.  Admitted with CHF, echo (8/24): EF <  20%, moderate LV dilation, RV normal, IVC dilated.  No evidence for ACS, mildly elevated TnI with no trend is likely demand ischemia from volume overload.  Possible etiologies include substance abuse (cocaine/ETOH), long-standing HTN, ? myocarditis. Also has wide LBBB. No strong family history of CHF.  Coronary angiography showed no significant coronary disease; RHC with RA 6, PCWP 15, CI 2.  NYHA IIb, he is volume overloaded on exam, weight up and ReDs 38-->46%. Did not have significant diuresis with doubling Lasix. - Stop Lasix - Start torsemide 40 mg daily. BMET/BNP today, repeat BMET in 10 days. - Continue Coreg 3.125 mg bid. - Continue losartan 25 mg daily (no ACEI or ARNI with angioedema on ACEi). BMET and BNP today - Continue spironolactone 25 mg daily. - Continue Jardiance. - Would ideally get cardiac MRI but he has had trouble with claustrophobia in the past. I offered Valium as pre-med, he will think about it. - Needs to quit ETOH and cocaine.  - With wide LBBB, may be CRT-D candidate in future if he can stop substance abuse.  2. HTN: BP better controlled. - Meds as above. 3. OSA: Uses CPAP.  4. ETOH/cocaine abuse: Counseled that this could play a role in cardiomyopathy.  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 Not currently on a statin.  - Start rosuvastatin 10 mg daily. LFTs and lipids in 8 weeks.   Follow up in 3-4 weeks with APP for fluid check. I asked him to bring all of his meds to his next appt. Keep follow up with Dr. Shirlee Latch in 2 months as  scheduled.  Prince Rome, FNP-BC 12/01/22

## 2022-12-01 ENCOUNTER — Encounter (HOSPITAL_COMMUNITY): Payer: Self-pay

## 2022-12-01 ENCOUNTER — Ambulatory Visit (HOSPITAL_COMMUNITY)
Admission: RE | Admit: 2022-12-01 | Discharge: 2022-12-01 | Disposition: A | Discharge status: Home / Self Care | Payer: Medicare Other | Source: Ambulatory Visit | Attending: Family Medicine | Admitting: Family Medicine

## 2022-12-01 ENCOUNTER — Ambulatory Visit (INDEPENDENT_AMBULATORY_CARE_PROVIDER_SITE_OTHER): Payer: Medicare Other | Admitting: *Deleted

## 2022-12-01 VITALS — BP 134/82 | HR 69 | Wt 233.2 lb

## 2022-12-01 VITALS — BP 112/68 | HR 73 | Temp 95.6°F | Resp 16 | Ht 73.0 in | Wt 231.2 lb

## 2022-12-01 DIAGNOSIS — K219 Gastro-esophageal reflux disease without esophagitis: Secondary | ICD-10-CM | POA: Diagnosis not present

## 2022-12-01 DIAGNOSIS — E119 Type 2 diabetes mellitus without complications: Secondary | ICD-10-CM | POA: Diagnosis not present

## 2022-12-01 DIAGNOSIS — I110 Hypertensive heart disease with heart failure: Secondary | ICD-10-CM | POA: Diagnosis present

## 2022-12-01 DIAGNOSIS — F1010 Alcohol abuse, uncomplicated: Secondary | ICD-10-CM | POA: Diagnosis not present

## 2022-12-01 DIAGNOSIS — I447 Left bundle-branch block, unspecified: Secondary | ICD-10-CM | POA: Diagnosis not present

## 2022-12-01 DIAGNOSIS — I5022 Chronic systolic (congestive) heart failure: Secondary | ICD-10-CM | POA: Diagnosis not present

## 2022-12-01 DIAGNOSIS — Z79899 Other long term (current) drug therapy: Secondary | ICD-10-CM | POA: Insufficient documentation

## 2022-12-01 DIAGNOSIS — G4733 Obstructive sleep apnea (adult) (pediatric): Secondary | ICD-10-CM | POA: Diagnosis not present

## 2022-12-01 DIAGNOSIS — D508 Other iron deficiency anemias: Secondary | ICD-10-CM

## 2022-12-01 DIAGNOSIS — D509 Iron deficiency anemia, unspecified: Secondary | ICD-10-CM

## 2022-12-01 DIAGNOSIS — F1410 Cocaine abuse, uncomplicated: Secondary | ICD-10-CM | POA: Insufficient documentation

## 2022-12-01 DIAGNOSIS — I10 Essential (primary) hypertension: Secondary | ICD-10-CM

## 2022-12-01 DIAGNOSIS — E7841 Elevated Lipoprotein(a): Secondary | ICD-10-CM | POA: Diagnosis not present

## 2022-12-01 LAB — BASIC METABOLIC PANEL
Anion gap: 10 (ref 5–15)
BUN: 21 mg/dL (ref 8–23)
CO2: 21 mmol/L — ABNORMAL LOW (ref 22–32)
Calcium: 9.3 mg/dL (ref 8.9–10.3)
Chloride: 104 mmol/L (ref 98–111)
Creatinine, Ser: 1.24 mg/dL (ref 0.61–1.24)
GFR, Estimated: >60 mL/min (ref >60)
Glucose, Bld: 122 mg/dL — ABNORMAL HIGH (ref 70–99)
Potassium: 4.1 mmol/L (ref 3.5–5.1)
Sodium: 135 mmol/L (ref 135–145)

## 2022-12-01 MED ORDER — SODIUM CHLORIDE 0.9 % IV SOLN
510.0000 mg | Freq: Once | INTRAVENOUS | Status: AC
  Administered 2022-12-01: 510 mg via INTRAVENOUS
  Filled 2022-12-01: qty 17

## 2022-12-01 MED ORDER — ROSUVASTATIN CALCIUM 10 MG PO TABS: 10.0000 mg | ORAL_TABLET | Freq: Every day | ORAL | 3 refills | Status: DC

## 2022-12-01 MED ORDER — TORSEMIDE 20 MG PO TABS: 20.0000 mg | ORAL_TABLET | Freq: Every day | ORAL | 6 refills | Status: DC

## 2022-12-01 NOTE — Progress Notes (Signed)
Diagnosis: Iron Deficiency Anemia  Provider:  Chilton Greathouse MD  Procedure: IV Infusion  IV Type: Peripheral, IV Location: R Forearm  Feraheme (Ferumoxytol), Dose: 510 mg  Infusion Start Time: 1432 pm  Infusion Stop Time: 1459 pm  Post Infusion IV Care: Observation period completed and Peripheral IV Discontinued  Discharge: Condition: Good, Destination: Home . AVS Provided  Performed by:  Forrest Moron, RN

## 2022-12-01 NOTE — Progress Notes (Signed)
ReDS Vest / Clip - 12/01/22 0920       ReDS Vest / Clip   Station Marker C    Ruler Value 31    ReDS Value Range High volume overload    ReDS Actual Value 46    Anatomical Comments sitting

## 2022-12-01 NOTE — Patient Instructions (Signed)
Thank you for coming in today  If you had labs drawn today, any labs that are abnormal the clinic will call you No news is good news  Medications: STOP Lasix START Torsemide 20 mg 1 tablet daily START Rosuvastatin 10 mg 1 tablet daily   PLEASE BEING IN ALL MEDICATIONS BOTTLES TO NEXT APPOINTMENT  Follow up appointments:  Your physician recommends that you return for lab work in: 10 days BMET  Your physician recommends that you schedule a follow-up appointment in:  3-4 WEEKS in clinic   Do the following things EVERYDAY: Weigh yourself in the morning before breakfast. Write it down and keep it in a log. Take your medicines as prescribed Eat low salt foods--Limit salt (sodium) to 2000 mg per day.  Stay as active as you can everyday Limit all fluids for the day to less than 2 liters (64 ounces) in 24 hours    At the [facility], you and your health needs are our priority. As part of our continuing mission to provide you with exceptional heart care, we have created designated Provider Care Teams. These Care Teams include your primary Cardiologist (physician) and Advanced Practice Providers (APPs- Physician Assistants and Nurse Practitioners) who all work together to provide you with the care you need, when you need it.   You may see any of the following providers on your designated Care Team at your next follow up: Dr Arvilla Meres Dr Marca Ancona Dr. Marcos Eke, NP Robbie Lis, [place] [facility] [place], [place] Brynda Peon, NP Karle Plumber, PharmD   Please be sure to bring in all your medications bottles to every appointment.    Thank you for choosing [facility]  If you have any questions or concerns before your next appointment please send [place] a message through [place] or call our office at [phone].    TO LEAVE A MESSAGE FOR THE NURSE SELECT OPTION 2, PLEASE LEAVE A MESSAGE  INCLUDING: YOUR NAME DATE OF BIRTH CALL BACK NUMBER REASON FOR CALL**this is important as we prioritize the call backs  YOU WILL RECEIVE A CALL BACK THE SAME DAY AS LONG AS YOU CALL BEFORE 4:00 PM

## 2022-12-11 ENCOUNTER — Ambulatory Visit (HOSPITAL_COMMUNITY)
Admission: RE | Admit: 2022-12-11 | Discharge: 2022-12-11 | Disposition: A | Discharge status: Home / Self Care | Payer: Non-veteran care | Source: Ambulatory Visit | Attending: Cardiology | Admitting: Cardiology

## 2022-12-11 DIAGNOSIS — I5022 Chronic systolic (congestive) heart failure: Secondary | ICD-10-CM | POA: Insufficient documentation

## 2022-12-11 LAB — BASIC METABOLIC PANEL
Anion gap: 9 (ref 5–15)
BUN: 22 mg/dL (ref 8–23)
CO2: 24 mmol/L (ref 22–32)
Calcium: 9.3 mg/dL (ref 8.9–10.3)
Chloride: 101 mmol/L (ref 98–111)
Creatinine, Ser: 1.39 mg/dL — ABNORMAL HIGH (ref 0.61–1.24)
GFR, Estimated: 55 mL/min — ABNORMAL LOW (ref >60)
Glucose, Bld: 117 mg/dL — ABNORMAL HIGH (ref 70–99)
Potassium: 4.2 mmol/L (ref 3.5–5.1)
Sodium: 134 mmol/L — ABNORMAL LOW (ref 135–145)

## 2022-12-21 NOTE — Progress Notes (Signed)
ADVANCED HF CLINIC NOTE  Primary Care: Sherwood Gambler, MD HF Cardiologist: Dr. Shirlee Latch  HPI: 67 y.o. male with history of LBBB (noted on ECGs in 2018), HTN, OSA on CPAP, iron deficiency anemia, GERD, DM, alcohol, THC and cocaine use. Maxwell Harris most of his care through the [place].   Admitted 8/24 with acute systolic heart failure and PNA. Started on abx. Echo showed EF <20%, RV ok, IVC dilated and estimated RAP 15.  UDS + cocaine and THC. Diuresed with IV lasix. R/LHC showed no significant coronary disease. RHC with near normal filling pressures, and low CI at 2. Unable to complete cMRI with claustrophobia. GDMT titrated, and he was discharged home, weight 221 lbs.  Today he returns for HF follow up. Overall feeling more SOB walking on flat ground. He is drinking > 2 L fluid/day. Did not notice significantly more urination on doubled dose of Lasix. Denies palpitations, CP, dizziness, edema, or PND/Orthopnea. Appetite ok. No fever or chills. Weight at home 222 pounds. Taking all medications.  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 Chronic knee/hip OA. No family Hx of CHF. No cocaine since discharge, back to drinking daily 1-2 glasses/day of wine. Smokes THC daily. Has 1 daughter, and 2 grandchildren. Follows at the [place].  ECG (personally reviewed): none ordered today.  ReDs: 38-->46%  Labs (8/24): K 3.7, creatinine 1.25, Lp(a) 134.6 Labs (9/24): K 3.9, creatinine 1.31, LDL 111  Cardiac Studies: - R/LHC (8/24): normal cors, RA 6, PA 34/17(25), PCWP 15, CO/Ci (Fick) 4.52/2, PVR 2.3 WU  - Echo (8/24): EF < 20%, RV normal, dilated IVC, estimated RAP 15  Past Medical History:  Diagnosis Date   Chronic GERD    Hypertension    Pain    Pre-diabetes    Current Outpatient Medications  Medication Sig Dispense Refill   acetaminophen (TYLENOL) 500 MG tablet Take 500 mg by mouth every 6 (six) hours as needed. joint     carvedilol  (COREG) 3.125 MG tablet Take 1 tablet (3.125 mg total) by mouth 2 (two) times daily with a meal. 60 tablet 6   cholecalciferol (VITAMIN D) 1000 UNITS tablet Take 1,000 Units by mouth daily.     cycloSPORINE (RESTASIS) 0.05 % ophthalmic emulsion Place 1 drop into both eyes 2 (two) times daily.     diclofenac Sodium (VOLTAREN) 1 % GEL Apply 2 g topically 4 (four) times daily as needed (pain).     DULoxetine (CYMBALTA) 20 MG capsule Take 20 mg by mouth daily.     empagliflozin (JARDIANCE) 25 MG TABS tablet Take 1/2 tablet daily 45 tablet 3   gabapentin (NEURONTIN) 300 MG capsule Take 300 mg by mouth 3 (three) times daily.     lidocaine (LIDODERM) 5 % Place onto the skin.     losartan (COZAAR) 25 MG tablet Take 1 tablet (25 mg total) by mouth daily. 90 tablet 3   OMEPRAZOLE PO Take 20 mg by mouth daily.     potassium chloride SA (KLOR-CON M) 20 MEQ tablet Take 1 tablet (20 mEq total) by mouth daily. 90 tablet 3   rosuvastatin (CRESTOR) 10 MG tablet Take 1 tablet (10 mg total) by mouth daily. 90 tablet 3   sildenafil (VIAGRA) 100 MG tablet Take 100 mg by mouth as needed for erectile dysfunction.     spironolactone (ALDACTONE) 25 MG tablet Take 1 tablet (25 mg total) by mouth daily. 90 tablet 3   torsemide (DEMADEX) 20 MG tablet  Take 1 tablet (20 mg total) by mouth daily. 30 tablet 6   No current facility-administered medications for this visit.   Allergies  Allergen Reactions   Lisinopril Swelling   Shellfish Allergy    Social History   Socioeconomic History   Marital status: Single    Spouse name: Not on file   Number of children: 1   Years of education: Not on file   Highest education level: Not on file  Occupational History   Occupation: retired  Tobacco Use   Smoking status: Former    Types: Cigarettes   Smokeless tobacco: Never   Tobacco comments:    Not able to remember when he quit  Vaping Use   Vaping status: Never Used  Substance and Sexual Activity   Alcohol use: Yes     Alcohol/week: 1.0 standard drink of alcohol    Types: 1 Cans of beer per week    Comment: social   Drug use: Yes    Frequency: 1.0 times per week    Types: Marijuana   Sexual activity: Not Currently  Other Topics Concern   Not on file  Social History Narrative   Not on file   Social Determinants of Health   Financial Resource Strain: Not on file  Food Insecurity: No Food Insecurity (10/20/2022)   Hunger Vital Sign    Worried About Running Out of Food in the Last Year: Never true    Ran Out of Food in the Last Year: Never true  Transportation Needs: No Transportation Needs (10/20/2022)   PRAPARE - Administrator, Civil Service (Medical): No    Lack of Transportation (Non-Medical): No  Physical Activity: Not on file  Stress: Not on file  Social Connections: Unknown (07/03/2021)   [facility]   Social Network    Social Network: Not on file  Intimate Partner Violence: Not At Risk (10/20/2022)   Humiliation, Afraid, Rape, and Kick questionnaire    Fear of Current or Ex-Partner: No    Emotionally Abused: No    Physically Abused: No    Sexually Abused: No   Family History  Problem Relation Age of Onset   Stroke Mother    Other Father        patient was a kid when his Dad died, not sure of the cause   Colon cancer Neg Hx    Esophageal cancer Neg Hx    There were no vitals taken for this visit.  Wt Readings from Last 3 Encounters:  12/01/22 105.8 kg (233 lb 3.2 oz)  12/01/22 104.9 kg (231 lb 3.2 oz)  11/24/22 104.5 kg (230 lb 6.4 oz)   PHYSICAL EXAM: General:  NAD. No resp difficulty HEENT: Normal Neck: Supple. JVP to jaw. Carotids 2+ bilat; no bruits. No lymphadenopathy or thryomegaly appreciated. Cor: PMI nondisplaced. Regular rate & rhythm. No rubs, gallops or murmurs. Lungs: Crackles LLL Abdomen: Soft, nontender, +distended. No hepatosplenomegaly. No bruits or masses. Good bowel sounds. Extremities: No cyanosis, clubbing,  rash, edema Neuro: Alert & oriented x 3, cranial nerves grossly intact. Moves all 4 extremities w/o difficulty. Affect pleasant.  ASSESSMENT & PLAN: 1. Chronic systolic CHF: New diagnosis.  Admitted with CHF, echo (8/24): EF < 20%, moderate LV dilation, RV normal, IVC dilated.  No evidence for ACS, mildly elevated TnI with no trend is likely demand ischemia from volume overload.  Possible etiologies include substance abuse (cocaine/ETOH), long-standing HTN, ? myocarditis. Also has wide LBBB. No strong  family history of CHF.  Coronary angiography showed no significant coronary disease; RHC with RA 6, PCWP 15, CI 2.  NYHA IIb, he is volume overloaded on exam, weight up and ReDs 38-->46%. Did not have significant diuresis with doubling Lasix. - Stop Lasix - Start torsemide 40 mg daily. BMET/BNP today, repeat BMET in 10 days. - Continue Coreg 3.125 mg bid. - Continue losartan 25 mg daily (no ACEI or ARNI with angioedema on ACEi). BMET and BNP today - Continue spironolactone 25 mg daily. - Continue Jardiance. - Would ideally get cardiac MRI but he has had trouble with claustrophobia in the past. I offered Valium as pre-med, he will think about it. - Needs to quit ETOH and cocaine.  - With wide LBBB, may be CRT-D candidate in future if he can stop substance abuse.  2. HTN: BP better controlled. - Meds as above. 3. OSA: Uses CPAP.  4. ETOH/cocaine abuse: Counseled that this could play a role in cardiomyopathy.  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 Not currently on a statin.  - Start rosuvastatin 10 mg daily. LFTs and lipids in 8 weeks.   Follow up in 3-4 weeks with APP for fluid check. I asked him to bring all of his meds to his next appt. Keep follow up with Dr. Shirlee Latch in 2 months as scheduled.  Maxwell Rome, FNP-BC 12/21/22

## 2022-12-22 ENCOUNTER — Ambulatory Visit (HOSPITAL_COMMUNITY)
Admission: RE | Admit: 2022-12-22 | Discharge: 2022-12-22 | Disposition: A | Discharge status: Home / Self Care | Payer: Medicare Other | Source: Ambulatory Visit | Attending: Family Medicine | Admitting: Family Medicine

## 2022-12-22 ENCOUNTER — Encounter (HOSPITAL_COMMUNITY): Payer: Self-pay

## 2022-12-22 VITALS — BP 116/80 | HR 72 | Wt 231.0 lb

## 2022-12-22 DIAGNOSIS — G4733 Obstructive sleep apnea (adult) (pediatric): Secondary | ICD-10-CM | POA: Diagnosis not present

## 2022-12-22 DIAGNOSIS — I10 Essential (primary) hypertension: Secondary | ICD-10-CM

## 2022-12-22 DIAGNOSIS — F1010 Alcohol abuse, uncomplicated: Secondary | ICD-10-CM | POA: Diagnosis not present

## 2022-12-22 DIAGNOSIS — F1410 Cocaine abuse, uncomplicated: Secondary | ICD-10-CM | POA: Insufficient documentation

## 2022-12-22 DIAGNOSIS — F1210 Cannabis abuse, uncomplicated: Secondary | ICD-10-CM | POA: Diagnosis not present

## 2022-12-22 DIAGNOSIS — K219 Gastro-esophageal reflux disease without esophagitis: Secondary | ICD-10-CM | POA: Diagnosis not present

## 2022-12-22 DIAGNOSIS — I447 Left bundle-branch block, unspecified: Secondary | ICD-10-CM | POA: Insufficient documentation

## 2022-12-22 DIAGNOSIS — I110 Hypertensive heart disease with heart failure: Secondary | ICD-10-CM | POA: Diagnosis not present

## 2022-12-22 DIAGNOSIS — D509 Iron deficiency anemia, unspecified: Secondary | ICD-10-CM | POA: Diagnosis not present

## 2022-12-22 DIAGNOSIS — E7841 Elevated Lipoprotein(a): Secondary | ICD-10-CM

## 2022-12-22 DIAGNOSIS — I5022 Chronic systolic (congestive) heart failure: Secondary | ICD-10-CM | POA: Insufficient documentation

## 2022-12-22 DIAGNOSIS — E119 Type 2 diabetes mellitus without complications: Secondary | ICD-10-CM | POA: Insufficient documentation

## 2022-12-22 LAB — BASIC METABOLIC PANEL
Anion gap: 10 (ref 5–15)
BUN: 24 mg/dL — ABNORMAL HIGH (ref 8–23)
CO2: 23 mmol/L (ref 22–32)
Calcium: 9.5 mg/dL (ref 8.9–10.3)
Chloride: 104 mmol/L (ref 98–111)
Creatinine, Ser: 1.6 mg/dL — ABNORMAL HIGH (ref 0.61–1.24)
GFR, Estimated: 46 mL/min — ABNORMAL LOW (ref >60)
Glucose, Bld: 127 mg/dL — ABNORMAL HIGH (ref 70–99)
Potassium: 4.4 mmol/L (ref 3.5–5.1)
Sodium: 137 mmol/L (ref 135–145)

## 2022-12-22 LAB — BRAIN NATRIURETIC PEPTIDE: B Natriuretic Peptide: 211.4 pg/mL — ABNORMAL HIGH (ref 0.0–100.0)

## 2022-12-22 NOTE — Patient Instructions (Addendum)
Thank you for coming in today  If you had labs drawn today, any labs that are abnormal the clinic will call you No news is good news  Medications: No changes   Follow up appointments:  Your physician recommends that you schedule a follow-up appointment in:  Keep follow up with With Dr. Shirlee Latch     Do the following things EVERYDAY: Weigh yourself in the morning before breakfast. Write it down and keep it in a log. Take your medicines as prescribed Eat low salt foods--Limit salt (sodium) to 2000 mg per day.  Stay as active as you can everyday Limit all fluids for the day to less than 2 liters   At the [facility], you and your health needs are our priority. As part of our continuing mission to provide you with exceptional heart care, we have created designated Provider Care Teams. These Care Teams include your primary Cardiologist (physician) and Advanced Practice Providers (APPs- Physician Assistants and Nurse Practitioners) who all work together to provide you with the care you need, when you need it.   You may see any of the following providers on your designated Care Team at your next follow up: Dr Arvilla Meres Dr Marca Ancona Dr. Marcos Eke, NP Robbie Lis, [place] [facility] [place], [place] Brynda Peon, NP Karle Plumber, PharmD   Please be sure to bring in all your medications bottles to every appointment.    Thank you for choosing [facility]  If you have any questions or concerns before your next appointment please send [place] a message through [place] or call our office at [phone].    TO LEAVE A MESSAGE FOR THE NURSE SELECT OPTION 2, PLEASE LEAVE A MESSAGE INCLUDING: YOUR NAME DATE OF BIRTH CALL BACK NUMBER REASON FOR CALL**this is important as we prioritize the call backs  YOU WILL RECEIVE A CALL BACK THE SAME DAY AS LONG AS YOU CALL BEFORE 4:00 PM

## 2022-12-22 NOTE — Progress Notes (Signed)
ReDS Vest / Clip - 12/22/22 0900       ReDS Vest / Clip   Station Marker D    Ruler Value 34    ReDS Value Range Low volume    ReDS Actual Value 34

## 2023-01-19 ENCOUNTER — Other Ambulatory Visit (HOSPITAL_COMMUNITY): Payer: Self-pay

## 2023-01-28 ENCOUNTER — Encounter: Payer: Self-pay | Admitting: Physician Assistant

## 2023-01-28 ENCOUNTER — Ambulatory Visit
Admission: EM | Admit: 2023-01-28 | Discharge: 2023-01-28 | Disposition: A | Discharge status: Home / Self Care | Payer: Non-veteran care | Attending: Emergency Medicine | Admitting: Emergency Medicine

## 2023-01-28 DIAGNOSIS — J069 Acute upper respiratory infection, unspecified: Secondary | ICD-10-CM

## 2023-01-28 LAB — POC COVID19/FLU A&B COMBO
Covid Antigen, POC: NEGATIVE
Influenza A Antigen, POC: NEGATIVE
Influenza B Antigen, POC: NEGATIVE

## 2023-01-28 MED ORDER — BENZONATATE 100 MG PO CAPS: 100.0000 mg | ORAL_CAPSULE | Freq: Three times a day (TID) | ORAL | 0 refills | Status: DC

## 2023-01-28 NOTE — Discharge Instructions (Addendum)
COVID and flu tests negative. Take Tessalon as prescribed to help with cough. Can also continue OTC medicine, rest, and keep hydrated. Return to care if no improvement in a week. Go to the ER if develop difficulty breathing or chest pain.

## 2023-01-28 NOTE — ED Provider Notes (Signed)
[facility]    CSN: [id number] Arrival date & time: 01/28/23  0840      History   Chief Complaint No chief complaint on file.   HPI Maxwell Harris is a 67 y.o. male.   Patient presents with concerns of cough, congestion, and fatigue for the past few days. He reports headache, nasal congestion, and productive cough. He reports some mild discomfort in the center of his chest when he coughs hard but denies any difficulty breathing, wheezing, or history of asthma or COPD. He denies fever, sore throat, or body aches. The patient has been taking Robitussin with minimal improvement. He states he has an echo next week and wants to make sure he doesn't have anything before then.   The history is provided by the patient.    Past Medical History:  Diagnosis Date   Chronic GERD    Hypertension    Pain    Pre-diabetes     Patient Active Problem List   Diagnosis Date Noted   Acute systolic CHF (congestive heart failure) (HCC) 10/23/2022   Dyspnea 10/20/2022   Essential hypertension 10/20/2022   Acute CHF (HCC) 10/20/2022   Polysubstance (excluding opioids) dependence (HCC) 10/20/2022   Iron deficiency anemia 03/27/2022    Past Surgical History:  Procedure Laterality Date   COLONOSCOPY     ESOPHAGOGASTRODUODENOSCOPY     RIGHT/LEFT HEART CATH AND CORONARY ANGIOGRAPHY N/A 10/22/2022   Procedure: RIGHT/LEFT HEART CATH AND CORONARY ANGIOGRAPHY;  Surgeon: Laurey Morale, MD;  Location: [facility] INVASIVE CV LAB;  Service: Cardiovascular;  Laterality: N/A;       Home Medications    Prior to Admission medications   Medication Sig Start Date End Date Taking? Authorizing Provider  benzonatate (TESSALON) 100 MG capsule Take 1 capsule (100 mg total) by mouth every 8 (eight) hours. 01/28/23  Yes Disney Ruggiero L, PA  acetaminophen (TYLENOL) 500 MG tablet Take 500 mg by mouth every 6 (six) hours as needed. joint    [provider]  carvedilol (COREG) 3.125 MG tablet Take 1  tablet (3.125 mg total) by mouth 2 (two) times daily with a meal. 11/04/22   [place], Anderson Malta, FNP  cholecalciferol (VITAMIN D) 1000 UNITS tablet Take 1,000 Units by mouth daily.    [provider]  cycloSPORINE (RESTASIS) 0.05 % ophthalmic emulsion Place 1 drop into both eyes 2 (two) times daily. 05/15/22   [provider]  diclofenac Sodium (VOLTAREN) 1 % GEL Apply 2 g topically 4 (four) times daily as needed (pain). 03/03/22   [provider]  DULoxetine (CYMBALTA) 20 MG capsule Take 20 mg by mouth daily.    [provider]  empagliflozin (JARDIANCE) 25 MG TABS tablet Take 1/2 tablet daily 11/04/22   Jacklynn Ganong, FNP  gabapentin (NEURONTIN) 300 MG capsule Take 300 mg by mouth 3 (three) times daily.    [provider]  lidocaine (LIDODERM) 5 % Place onto the skin. As needed 11/06/22   [provider]  losartan (COZAAR) 25 MG tablet Take 1 tablet (25 mg total) by mouth daily. 11/04/22 02/02/23  Jacklynn Ganong, FNP  OMEPRAZOLE PO Take 20 mg by mouth daily.    [provider]  potassium chloride SA (KLOR-CON M) 20 MEQ tablet Take 1 tablet (20 mEq total) by mouth daily. 11/03/22 02/01/23  Jacklynn Ganong, FNP  rosuvastatin (CRESTOR) 10 MG tablet Take 1 tablet (10 mg total) by mouth daily. 12/01/22 03/01/23  Jacklynn Ganong, FNP  sildenafil (VIAGRA) 100 MG tablet Take 100 mg by mouth as needed for erectile dysfunction. 09/15/22   [provider]  spironolactone (ALDACTONE) 25 MG tablet Take 1 tablet (25 mg total) by mouth daily. 11/04/22 02/02/23  Jacklynn Ganong, FNP  torsemide (DEMADEX) 20 MG tablet Take 1 tablet (20 mg total) by mouth daily. 12/01/22   [place], Anderson Malta, FNP    Family History Family History  Problem Relation Age of Onset   Stroke Mother    Other Father        patient was a kid when his Dad died, not sure of the cause   Colon cancer Neg Hx    Esophageal cancer Neg Hx     Social History Social  History   Tobacco Use   Smoking status: Former    Types: Cigarettes   Smokeless tobacco: Never   Tobacco comments:    Not able to remember when he quit  Vaping Use   Vaping status: Never Used  Substance Use Topics   Alcohol use: Yes    Alcohol/week: 1.0 standard drink of alcohol    Types: 1 Cans of beer per week    Comment: social   Drug use: Not Currently    Frequency: 1.0 times per week    Types: Marijuana     Allergies   Lisinopril and Shellfish allergy   Review of Systems Review of Systems  Constitutional:  Positive for fatigue. Negative for fever.  HENT:  Positive for congestion. Negative for ear pain and sore throat.   Respiratory:  Positive for cough. Negative for chest tightness, shortness of breath and wheezing.   Gastrointestinal:  Negative for diarrhea, nausea and vomiting.  Musculoskeletal:  Negative for myalgias.  Skin:  Negative for rash.  Neurological:  Positive for headaches. Negative for dizziness.     Physical Exam Triage Vital Signs ED Triage Vitals  Encounter Vitals Group     BP 01/28/23 0903 118/77     Systolic BP Percentile --      Diastolic BP Percentile --      Pulse Rate 01/28/23 0903 76     Resp 01/28/23 0903 20     Temp 01/28/23 0903 98 F (36.7 C)     Temp Source 01/28/23 0903 Oral     SpO2 01/28/23 0903 98 %     Weight 01/28/23 0905 230 lb (104.3 kg)     Height 01/28/23 0905 6\' 1"  (1.854 m)     Head Circumference --      Peak Flow --      Pain Score 01/28/23 0905 4     Pain Loc --      Pain Education --      Exclude from Growth Chart --    No data found.  Updated Vital Signs BP 118/77 (BP Location: Left Arm)   Pulse 76   Temp 98 F (36.7 C) (Oral)   Resp 20   Ht 6\' 1"  (1.854 m)   Wt 230 lb (104.3 kg)   SpO2 98%   BMI 30.34 kg/m   Visual Acuity Right Eye Distance:   Left Eye Distance:   Bilateral Distance:    Right Eye Near:   Left Eye Near:    Bilateral Near:     Physical Exam Vitals and nursing note  reviewed.  Constitutional:      General: He is not in acute distress. HENT:     Head: Normocephalic.     Nose: Congestion present. No rhinorrhea.  Mouth/Throat:     Mouth: Mucous membranes are moist.     Pharynx: Oropharynx is clear. No oropharyngeal exudate or posterior oropharyngeal erythema.  Eyes:     Conjunctiva/sclera: Conjunctivae normal.     Pupils: Pupils are equal, round, and reactive to light.  Cardiovascular:     Rate and Rhythm: Normal rate and regular rhythm.     Heart sounds: Normal heart sounds.  Pulmonary:     Effort: Pulmonary effort is normal.     Breath sounds: Normal breath sounds. No wheezing, rhonchi or rales.  Chest:     Chest wall: No tenderness.  Musculoskeletal:     Cervical back: Normal range of motion.  Lymphadenopathy:     Cervical: No cervical adenopathy.  Skin:    Findings: No rash.  Neurological:     Mental Status: He is alert.  Psychiatric:        Mood and Affect: Mood normal.      UC Treatments / Results  Labs (all labs ordered are listed, but only abnormal results are displayed) Labs Reviewed  POC COVID19/FLU A&B COMBO    EKG   Radiology No results found.  Procedures Procedures (including critical care time)  Medications Ordered in UC Medications - No data to display  Initial Impression / Assessment and Plan / UC Course  I have reviewed the triage vital signs and the nursing notes.  Pertinent labs & imaging results that were available during my care of the patient were reviewed by me and considered in my medical decision making (see chart for details).     Neg COVID and flu. Sx tx and reassurance, discussed return precautions.   E/M: 1 acute uncomplicated illness, 2 data, moderate risk due to prescription management  Final Clinical Impressions(s) / UC Diagnoses   Final diagnoses:  Viral URI     Discharge Instructions      COVID and flu tests negative. Take Tessalon as prescribed to help with cough. Can  also continue OTC medicine, rest, and keep hydrated. Return to care if no improvement in a week. Go to the ER if develop difficulty breathing or chest pain.      ED Prescriptions     Medication Sig Dispense Auth. Provider   benzonatate (TESSALON) 100 MG capsule Take 1 capsule (100 mg total) by mouth every 8 (eight) hours. 21 capsule Vallery Sa, Tiki Tucciarone L, PA      PDMP not reviewed this encounter.   Estanislado Pandy, [place] 01/28/23 1003

## 2023-01-28 NOTE — ED Triage Notes (Signed)
Patient presents with congestion and headaches x day 4. Treated with Robitussin, "unsure if it helped with symptoms but it put me to sleep."

## 2023-01-29 ENCOUNTER — Encounter: Payer: Self-pay | Admitting: Physician Assistant

## 2023-02-01 ENCOUNTER — Ambulatory Visit (HOSPITAL_BASED_OUTPATIENT_CLINIC_OR_DEPARTMENT_OTHER)
Admission: RE | Admit: 2023-02-01 | Discharge: 2023-02-01 | Disposition: A | Discharge status: Home / Self Care | Payer: Medicare Other | Source: Ambulatory Visit | Attending: Cardiology | Admitting: Cardiology

## 2023-02-01 ENCOUNTER — Encounter (HOSPITAL_COMMUNITY): Payer: Self-pay | Admitting: Cardiology

## 2023-02-01 ENCOUNTER — Ambulatory Visit (HOSPITAL_COMMUNITY)
Admission: RE | Admit: 2023-02-01 | Discharge: 2023-02-01 | Disposition: A | Discharge status: Home / Self Care | Payer: Medicare Other | Source: Ambulatory Visit | Attending: Cardiology | Admitting: Cardiology

## 2023-02-01 DIAGNOSIS — E785 Hyperlipidemia, unspecified: Secondary | ICD-10-CM | POA: Diagnosis not present

## 2023-02-01 DIAGNOSIS — K219 Gastro-esophageal reflux disease without esophagitis: Secondary | ICD-10-CM | POA: Insufficient documentation

## 2023-02-01 DIAGNOSIS — I5022 Chronic systolic (congestive) heart failure: Secondary | ICD-10-CM | POA: Diagnosis not present

## 2023-02-01 DIAGNOSIS — I5023 Acute on chronic systolic (congestive) heart failure: Secondary | ICD-10-CM | POA: Diagnosis present

## 2023-02-01 DIAGNOSIS — G4733 Obstructive sleep apnea (adult) (pediatric): Secondary | ICD-10-CM | POA: Insufficient documentation

## 2023-02-01 DIAGNOSIS — F1010 Alcohol abuse, uncomplicated: Secondary | ICD-10-CM | POA: Insufficient documentation

## 2023-02-01 DIAGNOSIS — F40240 Claustrophobia: Secondary | ICD-10-CM | POA: Insufficient documentation

## 2023-02-01 DIAGNOSIS — I447 Left bundle-branch block, unspecified: Secondary | ICD-10-CM | POA: Diagnosis not present

## 2023-02-01 DIAGNOSIS — I110 Hypertensive heart disease with heart failure: Secondary | ICD-10-CM | POA: Insufficient documentation

## 2023-02-01 DIAGNOSIS — Z006 Encounter for examination for normal comparison and control in clinical research program: Secondary | ICD-10-CM

## 2023-02-01 DIAGNOSIS — Z79899 Other long term (current) drug therapy: Secondary | ICD-10-CM | POA: Insufficient documentation

## 2023-02-01 DIAGNOSIS — Z87891 Personal history of nicotine dependence: Secondary | ICD-10-CM | POA: Diagnosis not present

## 2023-02-01 DIAGNOSIS — Z7984 Long term (current) use of oral hypoglycemic drugs: Secondary | ICD-10-CM | POA: Insufficient documentation

## 2023-02-01 LAB — BASIC METABOLIC PANEL
Anion gap: 11 (ref 5–15)
BUN: 20 mg/dL (ref 8–23)
CO2: 23 mmol/L (ref 22–32)
Calcium: 9.7 mg/dL (ref 8.9–10.3)
Chloride: 102 mmol/L (ref 98–111)
Creatinine, Ser: 1.59 mg/dL — ABNORMAL HIGH (ref 0.61–1.24)
GFR, Estimated: 47 mL/min — ABNORMAL LOW (ref >60)
Glucose, Bld: 129 mg/dL — ABNORMAL HIGH (ref 70–99)
Potassium: 4.5 mmol/L (ref 3.5–5.1)
Sodium: 136 mmol/L (ref 135–145)

## 2023-02-01 LAB — LIPID PANEL
Cholesterol: 109 mg/dL (ref 0–200)
HDL: 33 mg/dL — ABNORMAL LOW (ref >40)
LDL Cholesterol: 46 mg/dL (ref 0–99)
Total CHOL/HDL Ratio: 3.3 {ratio}
Triglycerides: 152 mg/dL — ABNORMAL HIGH (ref <150)
VLDL: 30 mg/dL (ref 0–40)

## 2023-02-01 LAB — BRAIN NATRIURETIC PEPTIDE: B Natriuretic Peptide: 161.4 pg/mL — ABNORMAL HIGH (ref 0.0–100.0)

## 2023-02-01 MED ORDER — LOSARTAN POTASSIUM 25 MG PO TABS: 25.0000 mg | ORAL_TABLET | Freq: Two times a day (BID) | ORAL | 3 refills | Status: DC

## 2023-02-01 MED ORDER — CARVEDILOL 6.25 MG PO TABS: 6.2500 mg | ORAL_TABLET | Freq: Two times a day (BID) | ORAL | 3 refills | Status: DC

## 2023-02-01 NOTE — Progress Notes (Signed)
ADVANCED HF CLINIC NOTE  Primary Care: Sherwood Gambler, MD HF Cardiologist: Dr. Shirlee Latch  HPI: 67 y.o. male with history of LBBB (noted on ECGs in 2018), HTN, OSA on CPAP, iron deficiency anemia, GERD, alcohol, THC and cocaine use. Maxwell Harris most of his care through the [place].   Admitted 8/24 with acute systolic heart failure and PNA. Started on abx. Echo showed EF <20%, RV ok, IVC dilated and estimated RAP 15.  UDS + cocaine and THC. Diuresed with IV lasix. R/LHC showed no significant coronary disease. RHC with near normal filling pressures, and low CI at 2. Unable to complete cMRI with claustrophobia. GDMT titrated, and he was discharged home, weight 221 lbs.  Echo was done today and reviewed, EF 20-25% range with septal-lateral dyssynchrony, normal RV size and systolic function, IVC normal.   He returns for followup of CHF.  No significant dyspnea.  He can get lightheaded if he stands up too fast.  No orthopnea/PND. No chest pain.  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  Now says he just drinks 1 glass wine/day.  Occasional cocaine use => says he has now quit. Chronic knee/hip OA.  He has low back pain with sciatica-type symptoms, pain shoots down the backs of his legs.   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 Cocaine abuse 2. ETOH abuse 3. HTN 4. GERD 5. Chronic systolic CHF: Nonischemic cardiomyopathy.  - R/LHC (8/24): normal coronaries; RA 6, PA 34/17(25), PCWP 15, CO/Ci (Fick) 4.52/2, PVR 2.3 WU.  - Echo (8/24): EF < 20%, RV normal, dilated IVC, estimated RAP 15 - Echo (12/24): EF 20-25% with septal-lateral dyssynchrony, normal RV, IVC normal.  6. OSA: CPAP 7. Fe deficiency anemia.  8. Osteoarthritis  Current Outpatient Medications  Medication Sig Dispense Refill   acetaminophen (TYLENOL) 500 MG tablet Take 500 mg by mouth  every 6 (six) hours as needed. joint     benzonatate (TESSALON) 100 MG capsule Take 1 capsule (100 mg total) by mouth every 8 (eight) hours. 21 capsule 0   cholecalciferol (VITAMIN D) 1000 UNITS tablet Take 1,000 Units by mouth daily.     cycloSPORINE (RESTASIS) 0.05 % ophthalmic emulsion Place 1 drop into both eyes 2 (two) times daily.     diclofenac Sodium (VOLTAREN) 1 % GEL Apply 2 g topically 4 (four) times daily as needed (pain).     DULoxetine (CYMBALTA) 20 MG capsule Take 20 mg by mouth daily.     empagliflozin (JARDIANCE) 25 MG TABS tablet Take 1/2 tablet daily 45 tablet 3   gabapentin (NEURONTIN) 300 MG capsule Take 300 mg by mouth 3 (three) times daily.     lidocaine (LIDODERM) 5 % Place onto the skin. As needed     OMEPRAZOLE PO Take 20 mg by mouth daily.     potassium chloride SA (KLOR-CON M) 20 MEQ tablet Take 1 tablet (20 mEq total) by mouth daily. 90 tablet 3   rosuvastatin (CRESTOR) 10 MG tablet Take 1 tablet (10 mg total) by mouth daily. 90 tablet 3   sildenafil (VIAGRA) 100 MG tablet Take 100 mg by mouth as needed for erectile dysfunction.     spironolactone (ALDACTONE) 25 MG tablet Take 1 tablet (25 mg total) by mouth daily. 90 tablet 3   torsemide (DEMADEX) 20 MG tablet Take 1 tablet (20 mg total) by mouth  daily. 30 tablet 6   carvedilol (COREG) 6.25 MG tablet Take 1 tablet (6.25 mg total) by mouth 2 (two) times daily with a meal. 60 tablet 3   losartan (COZAAR) 25 MG tablet Take 1 tablet (25 mg total) by mouth 2 (two) times daily. 60 tablet 3   No current facility-administered medications for this encounter.   Allergies  Allergen Reactions   Lisinopril Swelling   Shellfish Allergy    Social History   Socioeconomic History   Marital status: Single    Spouse name: Not on file   Number of children: 1   Years of education: Not on file   Highest education level: Not on file  Occupational History   Occupation: retired  Tobacco Use   Smoking status: Former    Types:  Cigarettes   Smokeless tobacco: Never   Tobacco comments:    Not able to remember when he quit  Vaping Use   Vaping status: Never Used  Substance and Sexual Activity   Alcohol use: Yes    Alcohol/week: 1.0 standard drink of alcohol    Types: 1 Cans of beer per week    Comment: social   Drug use: Not Currently    Frequency: 1.0 times per week    Types: Marijuana   Sexual activity: Not Currently  Other Topics Concern   Not on file  Social History Narrative   Not on file   Social Determinants of Health   Financial Resource Strain: Not on file  Food Insecurity: No Food Insecurity (10/20/2022)   Hunger Vital Sign    Worried About Running Out of Food in the Last Year: Never true    Ran Out of Food in the Last Year: Never true  Transportation Needs: No Transportation Needs (10/20/2022)   PRAPARE - Administrator, Civil Service (Medical): No    Lack of Transportation (Non-Medical): No  Physical Activity: Not on file  Stress: Not on file  Social Connections: Unknown (07/03/2021)   [facility]   Social Network    Social Network: Not on file  Intimate Partner Violence: Not At Risk (10/20/2022)   Humiliation, Afraid, Rape, and Kick questionnaire    Fear of Current or Ex-Partner: No    Emotionally Abused: No    Physically Abused: No    Sexually Abused: No   Family History  Problem Relation Age of Onset   Stroke Mother    Other Father        patient was a kid when his Dad died, not sure of the cause   Colon cancer Neg Hx    Esophageal cancer Neg Hx    BP 110/70   Pulse 69   Wt 105 kg (231 lb 6.4 oz)   SpO2 96%   BMI 30.53 kg/m   Wt Readings from Last 3 Encounters:  02/01/23 105 kg (231 lb 6.4 oz)  01/28/23 104.3 kg (230 lb)  12/22/22 104.8 kg (231 lb)   PHYSICAL EXAM: General: NAD Neck: No JVD, no thyromegaly or thyroid nodule.  Lungs: Clear to auscultation bilaterally with normal respiratory effort. CV: Nondisplaced PMI.   Heart regular S1/S2, no S3/S4, no murmur.  No peripheral edema.  No carotid bruit.  Normal pedal pulses.  Abdomen: Soft, nontender, no hepatosplenomegaly, no distention.  Skin: Intact without lesions or rashes.  Neurologic: Alert and oriented x 3.  Psych: Normal affect. Extremities: No clubbing or cyanosis.  HEENT: Normal.   ASSESSMENT & PLAN: 1.  Chronic systolic CHF: New diagnosis.  Admitted with CHF, echo (8/24): EF < 20%, moderate LV dilation, RV normal, IVC dilated.  No evidence for ACS, mildly elevated TnI with no trend is likely demand ischemia from volume overload.  Possible etiologies include substance abuse (cocaine/ETOH), long-standing HTN, ? myocarditis. Also has wide LBBB. No strong family history of CHF.  Coronary angiography showed no significant coronary disease; RHC with RA 6, PCWP 15, CI 2.  Echo today showed EF 20-25% range with septal-lateral dyssynchrony, normal RV size and systolic function, IVC normal.  NYHA class II, he is not volume overloaded on exam.  - Continue torsemide 20 mg daily.  - Increase Coreg to 6.25 mg bid.  - Increase losartan to 25 mg bid (no ACEI or ARNI with angioedema on ACEi). BMET and BNP today with BMET again in 10 days.  - Continue spironolactone 25 mg daily. - Continue Jardiance. - Unable to complete cardiac MRI due to claustrophobia.   - With wide LBBB and persistently low EF, I will refer him to EP for CRT-D.   2. HTN: BP controlled.  3. OSA: Uses CPAP.  4. ETOH abuse: now drinking no more than 1 glass wine/day.  5. Cocaine abuse: He has quit. 6. Hyperlipidemia: He is on Crestor 10 mg daily.  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  - Check lipids today.    Follow up in 2 months with APP.   Maxwell Harris 02/01/23

## 2023-02-01 NOTE — Progress Notes (Signed)
  Echocardiogram 2D Echocardiogram has been performed.  Maxwell Harris 02/01/2023, 10:02 AM

## 2023-02-01 NOTE — Research (Signed)
SITE: [id number]     Subject # [id number]    Subprotocol: A  Inclusion Criteria  Patients who meet all of the following criteria are eligible for enrollment as study participants:  Yes No  Age > 69 years old X   Eligible to wear Holter Study X    Exclusion Criteria  Patients who meet any of these criteria are not eligible for enrollment as study participants: Yes No  1. Receiving any mechanical (respiratory or circulatory) or renal support therapy at Screening or during Visit #1.  X  2.  Any other conditions that in the opinion of the investigators are likely to prevent compliance with the study protocol or pose a safety concern if the subject participates in the study.  X  3. Poor tolerance, namely susceptible to severe skin allergies from ECG adhesive patch application.  X   Protocol: REV H                                     Residential Zip [address] (First 3 digits ONLY)                                             [facility] Informed Consent   Subject Name: Maxwell Harris  Subject met inclusion and exclusion criteria.  The informed consent form, study requirements and expectations were reviewed with the subject. Subject had opportunity to read consent and questions and concerns were addressed prior to the signing of the consent form.  The subject verbalized understanding of the trial requirements.  The subject agreed to participate in the [facility] EF ACT trial and signed the informed consent at 09:49 on 01-Feb-2023.  The informed consent was obtained prior to performance of any protocol-specific procedures for the subject.  A copy of the signed informed consent was given to the subject and a copy was placed in the subject's medical record.   Dyanne Iha          Current Outpatient Medications:    acetaminophen (TYLENOL) 500 MG tablet, Take 500 mg by mouth every 6 (six) hours as needed. joint, Disp: , Rfl:    benzonatate (TESSALON) 100 MG capsule, Take 1 capsule (100 mg total) by mouth  every 8 (eight) hours., Disp: 21 capsule, Rfl: 0   carvedilol (COREG) 6.25 MG tablet, Take 1 tablet (6.25 mg total) by mouth 2 (two) times daily with a meal., Disp: 60 tablet, Rfl: 3   cholecalciferol (VITAMIN D) 1000 UNITS tablet, Take 1,000 Units by mouth daily., Disp: , Rfl:    cycloSPORINE (RESTASIS) 0.05 % ophthalmic emulsion, Place 1 drop into both eyes 2 (two) times daily., Disp: , Rfl:    diclofenac Sodium (VOLTAREN) 1 % GEL, Apply 2 g topically 4 (four) times daily as needed (pain)., Disp: , Rfl:    DULoxetine (CYMBALTA) 20 MG capsule, Take 20 mg by mouth daily., Disp: , Rfl:    empagliflozin (JARDIANCE) 25 MG TABS tablet, Take 1/2 tablet daily, Disp: 45 tablet, Rfl: 3   gabapentin (NEURONTIN) 300 MG capsule, Take 300 mg by mouth 3 (three) times daily., Disp: , Rfl:    lidocaine (LIDODERM) 5 %, Place onto the skin. As needed, Disp: , Rfl:    losartan (COZAAR) 25 MG tablet, Take 1 tablet (25 mg total) by  mouth 2 (two) times daily., Disp: 60 tablet, Rfl: 3   OMEPRAZOLE PO, Take 20 mg by mouth daily., Disp: , Rfl:    potassium chloride SA (KLOR-CON M) 20 MEQ tablet, Take 1 tablet (20 mEq total) by mouth daily., Disp: 90 tablet, Rfl: 3   rosuvastatin (CRESTOR) 10 MG tablet, Take 1 tablet (10 mg total) by mouth daily., Disp: 90 tablet, Rfl: 3   sildenafil (VIAGRA) 100 MG tablet, Take 100 mg by mouth as needed for erectile dysfunction., Disp: , Rfl:    spironolactone (ALDACTONE) 25 MG tablet, Take 1 tablet (25 mg total) by mouth daily., Disp: 90 tablet, Rfl: 3   torsemide (DEMADEX) 20 MG tablet, Take 1 tablet (20 mg total) by mouth daily., Disp: 30 tablet, Rfl: 6

## 2023-02-01 NOTE — Progress Notes (Signed)
  Echocardiogram 2D Echocardiogram has been performed.  Delcie Roch 02/01/2023, 10:01 AM

## 2023-02-01 NOTE — Patient Instructions (Signed)
Medication Changes:  INCREASE LOSARTAN TO 25MG  TWICE DAILY   INCREASE CARVEDILOL TO 6.25MG  TWICE DAILY   Lab Work:  Labs done today, your results will be available in MyChart, we will contact you for abnormal readings  Referrals:  YOU HAVE BEEN REFERRED TO ELECTROPHYSIOLOGY THEY WILL REACH OUT TO YOU OR CALL TO ARRANGE THIS. PLEASE CALL us WITH ANY CONCERNS   Follow-Up in: 2 MONTHS WITH APP AS SCHEDULED   At the [facility], you and your health needs are our priority. We have a designated team specialized in the treatment of Heart Failure. This Care Team includes your primary Heart Failure Specialized Cardiologist (physician), Advanced Practice Providers (APPs- Physician Assistants and Nurse Practitioners), and Pharmacist who all work together to provide you with the care you need, when you need it.   You may see any of the following providers on your designated Care Team at your next follow up:  Dr. Arvilla Meres Dr. Marca Ancona Dr. Dorthula Nettles Dr. Theresia Bough Tonye Becket, NP Robbie Lis, [place] [facility] [place], [place] Brynda Peon, NP [place] Lee, NP Karle Plumber, PharmD   Please be sure to bring in all your medications bottles to every appointment.   Need to Contact us:  If you have any questions or concerns before your next appointment please send [place] a message through [place] or call our office at [phone].    TO LEAVE A MESSAGE FOR THE NURSE SELECT OPTION 2, PLEASE LEAVE A MESSAGE INCLUDING: YOUR NAME DATE OF BIRTH CALL BACK NUMBER REASON FOR CALL**this is important as we prioritize the call backs  YOU WILL RECEIVE A CALL BACK THE SAME DAY AS LONG AS YOU CALL BEFORE 4:00 PM

## 2023-02-02 LAB — ECHOCARDIOGRAM COMPLETE
Area-P 1/2: 3.48 cm2
Est EF: <20
S' Lateral: 6 cm
Single Plane A4C EF: 10.7 %

## 2023-02-11 ENCOUNTER — Ambulatory Visit (HOSPITAL_COMMUNITY)
Admission: RE | Admit: 2023-02-11 | Discharge: 2023-02-11 | Disposition: A | Discharge status: Home / Self Care | Payer: Medicare Other | Source: Ambulatory Visit | Attending: Cardiology | Admitting: Cardiology

## 2023-02-11 DIAGNOSIS — I5022 Chronic systolic (congestive) heart failure: Secondary | ICD-10-CM | POA: Diagnosis present

## 2023-02-11 LAB — BASIC METABOLIC PANEL
Anion gap: 10 (ref 5–15)
BUN: 23 mg/dL (ref 8–23)
CO2: 26 mmol/L (ref 22–32)
Calcium: 9.9 mg/dL (ref 8.9–10.3)
Chloride: 103 mmol/L (ref 98–111)
Creatinine, Ser: 1.39 mg/dL — ABNORMAL HIGH (ref 0.61–1.24)
GFR, Estimated: 55 mL/min — ABNORMAL LOW (ref >60)
Glucose, Bld: 122 mg/dL — ABNORMAL HIGH (ref 70–99)
Potassium: 4.3 mmol/L (ref 3.5–5.1)
Sodium: 139 mmol/L (ref 135–145)

## 2023-02-15 ENCOUNTER — Other Ambulatory Visit (HOSPITAL_COMMUNITY): Payer: Self-pay | Admitting: Cardiology

## 2023-02-15 DIAGNOSIS — I5022 Chronic systolic (congestive) heart failure: Secondary | ICD-10-CM

## 2023-02-26 ENCOUNTER — Other Ambulatory Visit (HOSPITAL_COMMUNITY): Payer: Self-pay

## 2023-02-26 MED ORDER — ROSUVASTATIN CALCIUM 10 MG PO TABS: 10.0000 mg | ORAL_TABLET | Freq: Every day | ORAL | 3 refills | Status: DC

## 2023-02-26 MED ORDER — TORSEMIDE 20 MG PO TABS: 20.0000 mg | ORAL_TABLET | Freq: Every day | ORAL | 3 refills | Status: DC

## 2023-03-01 ENCOUNTER — Telehealth (HOSPITAL_COMMUNITY): Payer: Self-pay

## 2023-03-01 NOTE — Telephone Encounter (Addendum)
 Patient called our office because his pharmacy at [facility] gave him 2 rx's of furosemide  and torsemide .  Patient  is only suppose to take torsemide  20 mg daily. Spoke to [employer] and asked for patient's furosemide  medication to be discontinued. Patient has also been advised and agreed understanding. Patient also said he was having some head aches but was not sure if it was caused by his heart meds and will reach out to his pcp and will call our office back if needed.

## 2023-03-23 ENCOUNTER — Encounter: Payer: Self-pay | Admitting: *Deleted

## 2023-03-23 ENCOUNTER — Encounter: Payer: Self-pay | Admitting: Cardiology

## 2023-03-23 ENCOUNTER — Ambulatory Visit: Payer: No Typology Code available for payment source | Attending: Cardiology | Admitting: Cardiology

## 2023-03-23 VITALS — BP 124/80 | HR 63 | Ht 73.0 in | Wt 238.0 lb

## 2023-03-23 DIAGNOSIS — I10 Essential (primary) hypertension: Secondary | ICD-10-CM | POA: Diagnosis not present

## 2023-03-23 DIAGNOSIS — Z01812 Encounter for preprocedural laboratory examination: Secondary | ICD-10-CM | POA: Diagnosis not present

## 2023-03-23 DIAGNOSIS — I5022 Chronic systolic (congestive) heart failure: Secondary | ICD-10-CM | POA: Diagnosis not present

## 2023-03-23 DIAGNOSIS — G4733 Obstructive sleep apnea (adult) (pediatric): Secondary | ICD-10-CM | POA: Diagnosis not present

## 2023-03-23 LAB — CBC

## 2023-03-23 NOTE — Progress Notes (Signed)
  Electrophysiology Office Note:   Date:  03/23/2023  ID:  Maxwell Harris, DOB 09/02/1955, MRN [id number]  Primary Cardiologist: None Primary Heart Failure: Marca Ancona, MD Electrophysiologist: Regan Lemming, MD      History of Present Illness:   Maxwell Harris is a 68 y.o. male with h/o hypertension, sleep apnea, alcohol, tobacco, marijuana use, heart failure, left bundle branch block seen today for  for Electrophysiology evaluation of chronic systolic heart failure at the request of Marca Ancona.    He was admitted to the hospital August 2024 with systolic heart failure.  Ejection fraction is less than 20%.  Left heart catheterization showed no coronary artery disease.  He was started on optimal heart failure therapy.  Repeat echo shows an ejection fraction of 20 to 25%.  Today he is feeling well.  He has some baseline shortness of breath when he exerts himself, but otherwise is without complaint.  Today, denies symptoms of palpitations, chest pain, shortness of breath, orthopnea, PND, lower extremity edema, claudication, dizziness, presyncope, syncope, bleeding, or neurologic sequela. The patient is tolerating medications without difficulties.    Review of systems complete and found to be negative unless listed in HPI.   EP Information / Studies Reviewed:    EKG is ordered today. Personal review as below.  EKG Interpretation Date/Time:  Tuesday March 23 2023 09:11:41 EST Ventricular Rate:  63 PR Interval:  172 QRS Duration:  162 QT Interval:  464 QTC Calculation: 474 R Axis:   128  Text Interpretation: Normal sinus rhythm Left bundle branch block When compared with ECG of 02-Nov-2022 14:18, No significant change since last tracing Confirmed by Sudiksha Victor ([id number]) on 03/23/2023 9:18:59 AM     Risk Assessment/Calculations:              Physical Exam:   VS:  BP 124/80 (BP Location: Left Arm, Patient Position: Sitting, Cuff Size: Large)   Pulse 63   Ht 6\' 1"   (1.854 m)   Wt 238 lb (108 kg)   SpO2 96%   BMI 31.40 kg/m    Wt Readings from Last 3 Encounters:  03/23/23 238 lb (108 kg)  02/01/23 231 lb 6.4 oz (105 kg)  01/28/23 230 lb (104.3 kg)     GEN: Well nourished, well developed in no acute distress NECK: No JVD; No carotid bruits CARDIAC: Regular rate and rhythm, no murmurs, rubs, gallops RESPIRATORY:  Clear to auscultation without rales, wheezing or rhonchi  ABDOMEN: Soft, non-tender, non-distended EXTREMITIES:  No edema; No deformity   ASSESSMENT AND PLAN:    1.  Chronic systolic heart failure: Due to nonischemic cardiomyopathy.  Currently on optimal medical therapy per heart failure cardiology.  He has a wide left bundle branch block and a persistently low ejection fraction.  Due to this, he would benefit from CRT-D implant.  Risks and benefits have been discussed.  The patient understands these risks and is agreed to the procedure.  Risks include but not limited to bleeding, infection, pneumothorax, perforation, tamponade, vascular damage, renal failure, MI, stroke, death, and lead dislodgement .  2.  Hypertension: Well-controlled  3.  Obstructive sleep apnea: CPAP compliance encouraged  4.  Cocaine abuse: Patient is quit  5.  Alcohol abuse: Drinking no more than 1 glass of wine per day  Follow up with Dr. Elberta Fortis as usual post procedure  Signed, Lorilyn Laitinen Jorja Loa, MD

## 2023-03-23 NOTE — Patient Instructions (Signed)
Medication Instructions:  Your physician recommends that you continue on your current medications as directed. Please refer to the Current Medication list given to you today.     * If you need a refill on your cardiac medications before your next appointment, please call your pharmacy. *   Labwork: Pre procedure lab work today: BMET & CBC  If you have any lab test that is abnormal or we need to change your treatment, we will call you to review the results.   Testing/Procedures: Your physician has recommended that you have a defibrillator inserted. An implantable cardioverter defibrillator (ICD) is a small device that is placed in your chest or, in rare cases, your abdomen. This device uses electrical pulses or shocks to help control life-threatening, irregular heartbeats that could lead the heart to suddenly stop beating (sudden cardiac arrest). Leads are attached to the ICD that goes into your heart. This is done in the hospital and usually requires an overnight stay. Please follow your instruction letter   Follow-Up: You will be scheduled for a 2 week wound check and 3 month physician check.  * Please note that any paperwork needing to be filled out by the provider will need to be addressed at the front desk prior to seeing the provider.  Please note that any FMLA, disability or other documents regarding health condition is subject to a $29.00 charge that must be received prior to completion of paperwork in the form of a money order or check. *  Thank you for choosing [facility]!!   Dory Horn, RN [phone]   Other Instructions    Cardioverter Defibrillator Implantation An implantable cardioverter defibrillator (ICD) is a small, lightweight, battery-powered device that is placed (implanted) under the skin in the chest or abdomen. Your caregiver may prescribe an ICD if: You have had an irregular heart rhythm (arrhythmia) that originated in the lower chambers of the heart  (ventricles). Your heart has been damaged by a disease (such as coronary artery disease) or heart condition (such as a heart attack). An ICD consists of a battery that lasts several years, a small computer called a pulse generator, and wires called leads that go into the heart. It is used to detect and correct two dangerous arrhythmias: a rapid heart rhythm (tachycardia) and an arrhythmia in which the ventricles contract in an uncoordinated way (fibrillation). When an ICD detects tachycardia, it sends an electrical signal to the heart that restores the heartbeat to normal (cardioversion). This signal is usually painless. If cardioversion does not work or if the ICD detects fibrillation, it delivers a small electrical shock to the heart (defibrillation) to restart the heart. The shock may feel like a strong jolt in the chest. ICDs may be programmed to correct other problems. Sometimes, ICDs are programmed to act as another type of implantable device called a pacemaker. Pacemakers are used to treat a slow heartbeat (bradycardia). LET YOUR CAREGIVER KNOW ABOUT: Any allergies you have. All medicines you are taking, including vitamins, herbs, eyedrops, and over-the-counter medicines and creams. Previous problems you or members of your family have had with the use of anesthetics. Any blood disorders you have had. Other health problems you have. RISKS AND COMPLICATIONS Generally, the procedure to implant an ICD is safe. However, as with any surgical procedure, complications can occur. Possible complications associated with implanting an ICD include: Swelling, bleeding, or bruising at the site where the ICD was implanted. Infection at the site where the ICD was implanted. A reaction to  medicine used during the procedure. Nerve, heart, or blood vessel damage. Blood clots. BEFORE THE PROCEDURE You may need to have blood tests, heart tests, or a chest X-ray done before the day of the procedure. Ask your  caregiver about changing or stopping your regular medicines. Make plans to have someone drive you home. You may need to stay in the hospital overnight after the procedure. Stop smoking at least 24 hours before the procedure. Take a bath or shower the night before the procedure. You may need to scrub your chest or abdomen with a special type of soap. Do not eat or drink before your procedure for as long as directed by your caregiver. Ask if it is okay to take any needed medicine with a small sip of water. PROCEDURE  The procedure to implant an ICD in your chest or abdomen is usually done at a hospital in a room that has a large X-ray machine called a fluoroscope. The machine will be above you during the procedure. It will help your caregiver see your heart during the procedure. Implanting an ICD usually takes 1-3 hours. Before the procedure:  Small monitors will be put on your body. They will be used to check your heart, blood pressure, and oxygen level. A needle will be put into a vein in your hand or arm. This is called an intravenous (IV) access tube. Fluids and medicine will flow directly into your body through the IV tube. Your chest or abdomen will be cleaned with a germ-killing (antiseptic) solution. The area may be shaved. You may be given medicine to help you relax (sedative). You will be given a medicine called a local anesthetic. This medicine will make the surgical site numb while the ICD is implanted. You will be sleepy but awake during the procedure. After you are numb the procedure will begin. The caregiver will: Make a small cut (incision). This will make a pocket deep under your skin that will hold the pulse generator. Guide the leads through a large blood vessel into your heart and attach them to the heart muscles. Depending on the ICD, the leads may go into one ventricle or they may go to both ventricles and into an upper chamber of the heart (atrium). Test the ICD. Close the  incision with stitches, glue, or staples. AFTER THE PROCEDURE You may feel pain. Some pain is normal. It may last a few days. You may stay in a recovery area until the local anesthetic has worn off. Your blood pressure and pulse will be checked often. You will be taken to a room where your heart will be monitored. A chest X-ray will be taken. This is done to check that the cardioverter defibrillator is in the right place. You may stay in the hospital overnight. A slight bump may be seen over the skin where the ICD was placed. Sometimes, it is possible to feel the ICD under the skin. This is normal. In the months and years afterward, your caregiver will check the device, the leads, and the battery every few months. Eventually, when the battery is low, the ICD will be replaced.   This information is not intended to replace advice given to you by your health care provider. Make sure you discuss any questions you have with your health care provider.   Document Released: 11/01/2001 Document Revised: 11/30/2012 Document Reviewed: 02/29/2012 Elsevier Interactive Patient Education 2016 [employer].    Cardioverter Defibrillator Implantation, Care After This sheet gives you information about how  to care for yourself after your procedure. Your health care provider may also give you more specific instructions. If you have problems or questions, contact your health care provider. What can I expect after the procedure? After the procedure, it is common to have: Some pain. It may last a few days. A slight bump over the skin where the device was placed. Sometimes, it is possible to feel the device under the skin. This is normal.  During the months and years after your procedure, your health care provider will check the device, the leads, and the battery every few months. Eventually, when the battery is low, the device will be replaced. Follow these instructions at home: Medicines Take over-the-counter  and prescription medicines only as told by your health care provider. If you were prescribed an antibiotic medicine, take it as told by your health care provider. Do not stop taking the antibiotic even if you start to feel better. Incision care  Follow instructions from your health care provider about how to take care of your incision area. Make sure you: Wash your hands with soap and water before you change your bandage (dressing). If soap and water are not available, use hand sanitizer. Change your dressing as told by your health care provider. Leave stitches (sutures), skin glue, or adhesive strips in place. These skin closures may need to stay in place for 2 weeks or longer. If adhesive strip edges start to loosen and curl up, you may trim the loose edges. Do not remove adhesive strips completely unless your health care provider tells you to do that. Check your incision area every day for signs of infection. Check for: More redness, swelling, or pain. More fluid or blood. Warmth. Pus or a bad smell. Do not use lotions or ointments near the incision area unless told by your health care provider. Keep the incision area clean and dry for 2-3 days after the procedure or for as long as told by your health care provider. It takes several weeks for the incision site to heal completely. Do not take baths, swim, or use a hot tub until your health care provider approves. Activity Try to walk a little every day. Exercising is important after this procedure. Also, use your shoulder on the side of the defibrillator in daily tasks that do not require a lot of motion. For at least 6 weeks: Do not lift your upper arm above your shoulders. This means no tennis, golf, or swimming for this period of time. If you tend to sleep with your arm above your head, use a restraint to prevent this during sleep. Avoid sudden jerking, pulling, or chopping movements that pull your upper arm far away from your body. Ask  your health care provider when you may go back to work. Check with your health care provider before you start to drive or play sports. Electric and magnetic fields Tell all health care providers that you have a defibrillator. This may prevent them from giving you an MRI scan because strong magnets are used for that test. If you must pass through a metal detector, quickly walk through it. Do not stop under the detector, and do not stand near it. Avoid places or objects that have a strong electric or magnetic field, including: Scientist, physiological. At the airport, let officials know that you have a defibrillator. Your defibrillator ID card will let you be checked in a way that is safe for you and will not damage your defibrillator. Also, do  not let a security person wave a magnetic wand near your defibrillator. That can make it stop working. Power plants. Large electrical generators. Anti-theft systems or electronic article surveillance (EAS). Radiofrequency transmission towers, such as cell phone and radio towers. Do not use amateur (ham) radio equipment or electric (arc) welding torches. Some devices are safe to use if held at least 12 inches (30 cm) from your defibrillator. These include power tools, lawn mowers, and speakers. If you are unsure if something is safe to use, ask your health care provider. Do not use MP3 player headphones. They have magnets. You may safely use electric blankets, heating pads, computers, and microwave ovens. When using your cell phone, hold it to the ear that is on the opposite side from the defibrillator. Do not leave your cell phone in a pocket over the defibrillator. General instructions Follow diet instructions from your health care provider, if this applies. Always keep your defibrillator ID card with you. The card should list the implant date, device model, and manufacturer. Consider wearing a medical alert bracelet or necklace. Have your defibrillator checked  every 3-6 months or as often as told by your health care provider. Most defibrillators last for 4-8 years. Keep all follow-up visits as told by your health care provider. This is important for your health care provider to make sure your chest is healing the way it should. Ask your health care provider when you should come back to have your stitches or staples taken out. Contact a health care provider if: You feel one shock in your chest. You gain weight suddenly. Your legs or feet swell more than they have before. It feels like your heart is fluttering or skipping beats (heart palpitations). You have more redness, swelling, or pain around your incision. You have more fluid or blood coming from your incision. Your incision feels warm to the touch. You have pus or a bad smell coming from your incision. You have a fever. Get help right away if: You have chest pain. You feel more than one shock. You feel more short of breath than you have felt before. You feel more light-headed than you have felt before. Your incision starts to open up. This information is not intended to replace advice given to you by your health care provider. Make sure you discuss any questions you have with your health care provider. Document Released: 08/29/2004 Document Revised: 08/30/2015 Document Reviewed: 07/17/2015 Elsevier Interactive Patient Education  2018 [employer].     Supplemental Discharge Instructions for  Pacemaker/Defibrillator Patients  ACTIVITY No heavy lifting or vigorous activity with your left/right arm for 6 to 8 weeks.  Do not raise your left/right arm above your head for one week.  Gradually raise your affected arm as drawn below.           __  NO DRIVING for     ; you may begin driving on     .  WOUND CARE Keep the wound area clean and dry.  Do not get this area wet for one week. No showers for one week; you may shower on     . The tape/steri-strips on your wound will fall off; do  not pull them off.  No bandage is needed on the site.  DO  NOT apply any creams, oils, or ointments to the wound area. If you notice any drainage or discharge from the wound, any swelling or bruising at the site, or you develop a fever > 101? F after  you are discharged home, call the office at once.  SPECIAL INSTRUCTIONS You are still able to use cellular telephones; use the ear opposite the side where you have your pacemaker/defibrillator.  Avoid carrying your cellular phone near your device. When traveling through airports, show security personnel your identification card to avoid being screened in the metal detectors.  Ask the security personnel to use the hand wand. Avoid arc welding equipment, MRI testing (magnetic resonance imaging), TENS units (transcutaneous nerve stimulators).  Call the office for questions about other devices. Avoid electrical appliances that are in poor condition or are not properly grounded. Microwave ovens are safe to be near or to operate.  ADDITIONAL INFORMATION FOR DEFIBRILLATOR PATIENTS SHOULD YOUR DEVICE GO OFF: If your device goes off ONCE and you feel fine afterward, notify the device clinic nurses. If your device goes off ONCE and you do not feel well afterward, call 911. If your device goes off TWICE, call 911. If your device goes off THREE TIMES IN ONE DAY, call 911.  DO NOT DRIVE YOURSELF OR A FAMILY MEMBER WITH A DEFIBRILLATOR TO THE HOSPITAL--CALL 911.

## 2023-03-24 LAB — CBC
Hematocrit: 44 % (ref 37.5–51.0)
Hemoglobin: 14.2 g/dL (ref 13.0–17.7)
MCH: 28 pg (ref 26.6–33.0)
MCHC: 32.3 g/dL (ref 31.5–35.7)
MCV: 87 fL (ref 79–97)
Platelets: 289 x10E3/uL (ref 150–450)
RBC: 5.07 x10E6/uL (ref 4.14–5.80)
RDW: 14.5 % (ref 11.6–15.4)
WBC: 5 x10E3/uL (ref 3.4–10.8)

## 2023-03-24 LAB — BASIC METABOLIC PANEL WITH GFR
BUN/Creatinine Ratio: 16 (ref 10–24)
BUN: 25 mg/dL (ref 8–27)
CO2: 22 mmol/L (ref 20–29)
Calcium: 10.1 mg/dL (ref 8.6–10.2)
Chloride: 99 mmol/L (ref 96–106)
Creatinine, Ser: 1.55 mg/dL — ABNORMAL HIGH (ref 0.76–1.27)
Glucose: 142 mg/dL — ABNORMAL HIGH (ref 70–99)
Potassium: 4.8 mmol/L (ref 3.5–5.2)
Sodium: 140 mmol/L (ref 134–144)
eGFR: 48 mL/min/1.73 — ABNORMAL LOW

## 2023-04-02 ENCOUNTER — Telehealth (HOSPITAL_COMMUNITY): Payer: Self-pay

## 2023-04-02 NOTE — Telephone Encounter (Signed)
 Called to confirm/remind patient of their appointment at the [facility] on 04/05/23.   Patient reminded to bring all medications and/or complete list.  Confirmed patient has transportation. Gave directions, instructed to utilize valet parking.  Confirmed appointment prior to ending call.

## 2023-04-05 ENCOUNTER — Ambulatory Visit (HOSPITAL_COMMUNITY)
Admission: RE | Admit: 2023-04-05 | Discharge: 2023-04-05 | Disposition: A | Discharge status: Home / Self Care | Payer: No Typology Code available for payment source | Source: Ambulatory Visit | Attending: Adult Health | Admitting: Adult Health

## 2023-04-05 VITALS — BP 118/72 | HR 66 | Wt 241.2 lb

## 2023-04-05 DIAGNOSIS — G4733 Obstructive sleep apnea (adult) (pediatric): Secondary | ICD-10-CM | POA: Diagnosis not present

## 2023-04-05 DIAGNOSIS — Z87891 Personal history of nicotine dependence: Secondary | ICD-10-CM | POA: Insufficient documentation

## 2023-04-05 DIAGNOSIS — I10 Essential (primary) hypertension: Secondary | ICD-10-CM | POA: Diagnosis not present

## 2023-04-05 DIAGNOSIS — I5022 Chronic systolic (congestive) heart failure: Secondary | ICD-10-CM | POA: Insufficient documentation

## 2023-04-05 DIAGNOSIS — E785 Hyperlipidemia, unspecified: Secondary | ICD-10-CM | POA: Insufficient documentation

## 2023-04-05 DIAGNOSIS — F1411 Cocaine abuse, in remission: Secondary | ICD-10-CM | POA: Insufficient documentation

## 2023-04-05 DIAGNOSIS — F1010 Alcohol abuse, uncomplicated: Secondary | ICD-10-CM | POA: Diagnosis not present

## 2023-04-05 DIAGNOSIS — K219 Gastro-esophageal reflux disease without esophagitis: Secondary | ICD-10-CM | POA: Diagnosis not present

## 2023-04-05 DIAGNOSIS — D509 Iron deficiency anemia, unspecified: Secondary | ICD-10-CM | POA: Insufficient documentation

## 2023-04-05 DIAGNOSIS — I110 Hypertensive heart disease with heart failure: Secondary | ICD-10-CM | POA: Diagnosis present

## 2023-04-05 NOTE — Patient Instructions (Addendum)
 Great to see you today!!!  No changes, continue your current medications  Your physician recommends that you schedule a follow-up appointment in: 3 months (May), **PLEASE CALL OUR OFFICE IN MARCH TO SCHEDULE THIS APPOINTMENT  Do the following things EVERYDAY: Weigh yourself in the morning before breakfast. Write it down and keep it in a log. Take your medicines as prescribed Eat low salt foods--Limit salt (sodium) to 2000 mg per day.  Stay as active as you can everyday Limit all fluids for the day to less than 2 liters  If you have any questions or concerns before your next appointment please send us  a message through [place] or call our office at [phone].    TO LEAVE A MESSAGE FOR THE NURSE SELECT OPTION 2, PLEASE LEAVE A MESSAGE INCLUDING: YOUR NAME DATE OF BIRTH CALL BACK NUMBER REASON FOR CALL**this is important as we prioritize the call backs  YOU WILL RECEIVE A CALL BACK THE SAME DAY AS LONG AS YOU CALL BEFORE 4:00 PM  At the [facility], you and your health needs are our priority. As part of our continuing mission to provide you with exceptional heart care, we have created designated Provider Care Teams. These Care Teams include your primary Cardiologist (physician) and Advanced Practice Providers (APPs- Physician Assistants and Nurse Practitioners) who all work together to provide you with the care you need, when you need it.   You may see any of the following providers on your designated Care Team at your next follow up: Dr Jules Oar Dr Peder Bourdon Dr. Alwin Baars Dr. Arta Lark Amy Marijane Shoulders, NP Ruddy Corral, [place] [facility] [place], [place] Dennise Fitz, NP [place] Lee, NP Luster Salters, PharmD   Please be sure to bring in all your medications bottles to every appointment.    Thank you for choosing [facility]

## 2023-04-05 NOTE — Progress Notes (Signed)
 ADVANCED HF CLINIC NOTE  Primary Care: Tatiana Farrier, MD at the [facility] HF Cardiologist: Dr. Mitzie Anda EP: Dr Lawana Pray  Chief Complaint : Heart Failure   HPI: 68 y.o. male with history of LBBB (noted on ECGs in 2018), HTN, OSA on CPAP, iron deficiency anemia, GERD, alcohol, THC and cocaine use. Maxwell Harris most of his care through the [place].   Admitted 8/24 with acute systolic heart failure and PNA. Started on abx. Echo showed EF <20%, RV ok, IVC dilated and estimated RAP 15.  UDS + cocaine and THC. Diuresed with IV lasix . R/LHC showed no significant coronary disease. RHC with near normal filling pressures, and low CI at 2. Unable to complete cMRI with claustrophobia. GDMT titrated.  Saw EP and plans for CRT-D 04/09/23.    Today he returns for HF follow up.Overall feeling fine. SOB with steps/inclines. Denies PND/Orthopnea. He does not use CPAP but has the machine. Appetite ok. No fever or chills. Weight at home has been stable. Drinking a glass of wine a day. Smokes marijuana. Taking all medications.   PMH: 1. Cocaine abuse 2. ETOH abuse 3. HTN 4. GERD 5. Chronic systolic CHF: Nonischemic cardiomyopathy.  - R/LHC (8/24): normal coronaries; RA 6, PA 34/17(25), PCWP 15, CO/Ci (Fick) 4.52/2, PVR 2.3 WU.  - Echo (8/24): EF < 20%, RV normal, dilated IVC, estimated RAP 15 - Echo (12/24): EF 20-25% with septal-lateral dyssynchrony, normal RV, IVC normal.  6. OSA: CPAP 7. Fe deficiency anemia.  8. Osteoarthritis  Current Outpatient Medications  Medication Sig Dispense Refill   acetaminophen  (TYLENOL ) 500 MG tablet Take 500 mg by mouth every 6 (six) hours as needed. joint     carvedilol  (COREG ) 6.25 MG tablet TAKE 1 TABLET BY MOUTH 2 TIMES DAILY WITH A MEAL. 180 tablet 1   cholecalciferol (VITAMIN D) 1000 UNITS tablet Take 1,000 Units by mouth daily.     cycloSPORINE (RESTASIS) 0.05 % ophthalmic emulsion Place 1 drop into both eyes 2 (two) times daily.     diclofenac Sodium (VOLTAREN) 1 % GEL  Apply 2 g topically 4 (four) times daily as needed (pain).     DULoxetine  (CYMBALTA ) 20 MG capsule Take 20 mg by mouth daily.     empagliflozin  (JARDIANCE ) 25 MG TABS tablet Take 1/2 tablet daily 45 tablet 3   gabapentin  (NEURONTIN ) 300 MG capsule Take 300 mg by mouth 2 (two) times daily.     lidocaine  (LIDODERM ) 5 % Place onto the skin. As needed     losartan  (COZAAR ) 25 MG tablet Take 1 tablet (25 mg total) by mouth 2 (two) times daily. 60 tablet 3   OMEPRAZOLE PO Take 20 mg by mouth daily.     potassium chloride  SA (KLOR-CON  M) 20 MEQ tablet Take 1 tablet (20 mEq total) by mouth daily. 90 tablet 3   rosuvastatin  (CRESTOR ) 10 MG tablet Take 1 tablet (10 mg total) by mouth daily. 90 tablet 3   sildenafil (VIAGRA) 100 MG tablet Take 100 mg by mouth as needed for erectile dysfunction.     spironolactone  (ALDACTONE ) 25 MG tablet Take 1 tablet (25 mg total) by mouth daily. 90 tablet 3   torsemide  (DEMADEX ) 20 MG tablet Take 1 tablet (20 mg total) by mouth daily. 90 tablet 3   No current facility-administered medications for this encounter.   Allergies  Allergen Reactions   Lisinopril Swelling   Shellfish Allergy    Social History   Socioeconomic History   Marital status: Single  Spouse name: Not on file   Number of children: 1   Years of education: Not on file   Highest education level: Not on file  Occupational History   Occupation: retired  Tobacco Use   Smoking status: Former    Types: Cigarettes   Smokeless tobacco: Never   Tobacco comments:    Not able to remember when he quit  Vaping Use   Vaping status: Never Used  Substance and Sexual Activity   Alcohol use: Yes    Alcohol/week: 1.0 standard drink of alcohol    Types: 1 Cans of beer per week    Comment: social   Drug use: Not Currently    Frequency: 1.0 times per week    Types: Marijuana   Sexual activity: Not Currently  Other Topics Concern   Not on file  Social History Narrative   Not on file   Social  Drivers of Health   Financial Resource Strain: Not on file  Food Insecurity: No Food Insecurity (10/20/2022)   Hunger Vital Sign    Worried About Running Out of Food in the Last Year: Never true    Ran Out of Food in the Last Year: Never true  Transportation Needs: No Transportation Needs (10/20/2022)   PRAPARE - Administrator, Civil Service (Medical): No    Lack of Transportation (Non-Medical): No  Physical Activity: Not on file  Stress: Not on file  Social Connections: Unknown (07/03/2021)   Received from [facility]   Social Network    Social Network: Not on file  Intimate Partner Violence: Not At Risk (10/20/2022)   Humiliation, Afraid, Rape, and Kick questionnaire    Fear of Current or Ex-Partner: No    Emotionally Abused: No    Physically Abused: No    Sexually Abused: No   Family History  Problem Relation Age of Onset   Stroke Mother    Other Father        patient was a kid when his Dad died, not sure of the cause   Colon cancer Neg Hx    Esophageal cancer Neg Hx    BP 118/72   Pulse 66   Wt 109.4 kg (241 lb 3.2 oz)   SpO2 99%   BMI 31.82 kg/m   Wt Readings from Last 3 Encounters:  04/05/23 109.4 kg (241 lb 3.2 oz)  03/23/23 108 kg (238 lb)  02/01/23 105 kg (231 lb 6.4 oz)   PHYSICAL EXAM: General:  No resp difficulty Neck: supple. no JVD. Carotids 2+ bilat; no bruits.  Cor: PMI nondisplaced. Regular rate & rhythm. No rubs, gallops or murmurs. Lungs: clear Abdomen: soft, nontender, nondistended. No hepatosplenomegaly. No bruits or masses. Good bowel sounds. Extremities: no cyanosis, clubbing, rash, edema Neuro: alert & orientedx3, moves all 4 extremities w/o difficulty. Affect pleasant  ASSESSMENT & PLAN: 1. Chronic systolic CHF: New diagnosis.  Admitted with CHF, echo (8/24): EF < 20%, moderate LV dilation, RV normal, IVC dilated.  No evidence for ACS, mildly elevated TnI with no trend is likely demand ischemia from volume  overload.  Possible etiologies include substance abuse (cocaine/ETOH), long-standing HTN, ? myocarditis. Also has wide LBBB. No strong family history of CHF.  Coronary angiography showed no significant coronary disease; RHC with RA 6, PCWP 15, CI 2.  Echo today showed EF 20-25% range with septal-lateral dyssynchrony, normal RV size and systolic function, IVC normal.  - NYHA III. Appears euvolemic.  - Continue torsemide  20 mg daily.  -  Continue  Coreg  to 6.25 mg bid.  - Continue losartan  to 25 mg bid (no ACEI or ARNI with angioedema on ACEi).  - Continue spironolactone  25 mg daily. - Continue Jardiance  25 mg daily  - Unable to complete cardiac MRI due to claustrophobia. - EP planning for CRT-D later this week.   2. HTN: Stable continue current regimen.  3. OSA: He has CPAP but doesn't use it.  4. ETOH abuse: Drinks 1 glass of wine a day. Discussed limiting alcohol.  5. H/O Cocaine abuse: No recent use. . 6. Hyperlipidemia: He is on Crestor  10 mg daily.  I reviewed lipid panel from 02/01/2023. LDL down to 46.    Follow up in 3 months with Dr Mitzie Anda.   Nieves Bars NP- C 04/05/23

## 2023-04-08 NOTE — Pre-Procedure Instructions (Signed)
Instructed patient on the following items: Arrival time 1230 Nothing to eat or drink after midnight No meds AM of procedure Responsible person to drive you home and stay with you for 24 hrs Wash with special soap night before and morning of procedure

## 2023-04-09 ENCOUNTER — Other Ambulatory Visit: Payer: Self-pay

## 2023-04-09 ENCOUNTER — Ambulatory Visit (HOSPITAL_COMMUNITY)
Admission: RE | Admit: 2023-04-09 | Discharge: 2023-04-09 | Disposition: A | Discharge status: Home / Self Care | Payer: No Typology Code available for payment source | Attending: Cardiology | Admitting: Cardiology

## 2023-04-09 ENCOUNTER — Telehealth: Payer: Self-pay

## 2023-04-09 ENCOUNTER — Encounter (HOSPITAL_COMMUNITY)
Admission: RE | Disposition: A | Discharge status: Home / Self Care | Payer: No Typology Code available for payment source | Source: Home / Self Care | Attending: Cardiology

## 2023-04-09 DIAGNOSIS — I5022 Chronic systolic (congestive) heart failure: Secondary | ICD-10-CM | POA: Insufficient documentation

## 2023-04-09 DIAGNOSIS — Z539 Procedure and treatment not carried out, unspecified reason: Secondary | ICD-10-CM | POA: Insufficient documentation

## 2023-04-09 LAB — GLUCOSE, CAPILLARY: Glucose-Capillary: 111 mg/dL — ABNORMAL HIGH (ref 70–99)

## 2023-04-09 SURGERY — BIV ICD INSERTION CRT-D

## 2023-04-09 MED ORDER — SODIUM CHLORIDE 0.9 % IV SOLN: INTRAVENOUS | Status: DC

## 2023-04-09 MED ORDER — POVIDONE-IODINE 10 % EX SWAB
2.0000 | Freq: Once | CUTANEOUS | Status: AC
Start: 2023-04-09 — End: 2023-04-09
  Administered 2023-04-09: 2 via TOPICAL

## 2023-04-09 MED ORDER — SODIUM CHLORIDE 0.9 % IV SOLN: 80.0000 mg | INTRAVENOUS | Status: DC

## 2023-04-09 MED ORDER — CHLORHEXIDINE GLUCONATE 4 % EX SOLN
4.0000 | Freq: Once | CUTANEOUS | Status: DC
  Filled 2023-04-09: qty 60

## 2023-04-09 MED ORDER — CEFAZOLIN SODIUM-DEXTROSE 2-4 GM/100ML-% IV SOLN: 2.0000 g | INTRAVENOUS | Status: DC

## 2023-04-09 NOTE — Telephone Encounter (Signed)
Received a call from Dr Elberta Fortis that this pt's procedure would need to be rescheduled. He ate chicken nuggets at 11 am. I called pt and moved him from 2/14 to 2/18 at 3:00 pm.   He is aware that he can NOT eat or drink anything after 7:00 am. I have updated his Instruction letter and sent it via MyChart.

## 2023-04-09 NOTE — H&P (Signed)
Patient presented for BiV ICD implantation.  Patient alerted the staff he had eaten chicken nuggets between 11 and 11:30 AM.  Due to this, the procedure was canceled and would be rescheduled.  Loman Brooklyn, MD

## 2023-04-09 NOTE — H&P (View-Only) (Signed)
 Patient presented for BiV ICD implantation.  Patient alerted the staff he had eaten chicken nuggets between 11 and 11:30 AM.  Due to this, the procedure was canceled and would be rescheduled.  Loman Brooklyn, MD

## 2023-04-12 NOTE — Pre-Procedure Instructions (Signed)
 Instructed patient on the following items: Arrival time 1230 Nothing to eat or drink after midnight No meds AM of procedure Responsible person to drive you home and stay with you for 24 hrs Wash with special soap night before and morning of procedure

## 2023-04-13 ENCOUNTER — Other Ambulatory Visit: Payer: Self-pay

## 2023-04-13 ENCOUNTER — Encounter (HOSPITAL_COMMUNITY)
Admission: RE | Disposition: A | Discharge status: Home / Self Care | Payer: Self-pay | Source: Home / Self Care | Attending: Cardiology

## 2023-04-13 ENCOUNTER — Ambulatory Visit (HOSPITAL_COMMUNITY)
Admission: RE | Admit: 2023-04-13 | Discharge: 2023-04-13 | Disposition: A | Discharge status: Home / Self Care | Payer: No Typology Code available for payment source | Attending: Cardiology | Admitting: Cardiology

## 2023-04-13 ENCOUNTER — Ambulatory Visit (HOSPITAL_COMMUNITY): Payer: No Typology Code available for payment source

## 2023-04-13 DIAGNOSIS — Z79899 Other long term (current) drug therapy: Secondary | ICD-10-CM | POA: Insufficient documentation

## 2023-04-13 DIAGNOSIS — Z7984 Long term (current) use of oral hypoglycemic drugs: Secondary | ICD-10-CM | POA: Diagnosis not present

## 2023-04-13 DIAGNOSIS — I447 Left bundle-branch block, unspecified: Secondary | ICD-10-CM | POA: Insufficient documentation

## 2023-04-13 DIAGNOSIS — I428 Other cardiomyopathies: Secondary | ICD-10-CM | POA: Diagnosis not present

## 2023-04-13 DIAGNOSIS — E785 Hyperlipidemia, unspecified: Secondary | ICD-10-CM | POA: Insufficient documentation

## 2023-04-13 DIAGNOSIS — I110 Hypertensive heart disease with heart failure: Secondary | ICD-10-CM | POA: Insufficient documentation

## 2023-04-13 DIAGNOSIS — Z87891 Personal history of nicotine dependence: Secondary | ICD-10-CM | POA: Diagnosis not present

## 2023-04-13 DIAGNOSIS — I5022 Chronic systolic (congestive) heart failure: Secondary | ICD-10-CM | POA: Insufficient documentation

## 2023-04-13 DIAGNOSIS — G4733 Obstructive sleep apnea (adult) (pediatric): Secondary | ICD-10-CM | POA: Diagnosis not present

## 2023-04-13 DIAGNOSIS — I429 Cardiomyopathy, unspecified: Secondary | ICD-10-CM

## 2023-04-13 DIAGNOSIS — F1010 Alcohol abuse, uncomplicated: Secondary | ICD-10-CM | POA: Insufficient documentation

## 2023-04-13 DIAGNOSIS — I509 Heart failure, unspecified: Secondary | ICD-10-CM | POA: Diagnosis present

## 2023-04-13 HISTORY — PX: BIV ICD INSERTION CRT-D: EP1195

## 2023-04-13 SURGERY — BIV ICD INSERTION CRT-D

## 2023-04-13 MED ORDER — SODIUM CHLORIDE 0.9% FLUSH: 3.0000 mL | Freq: Two times a day (BID) | INTRAVENOUS | Status: DC

## 2023-04-13 MED ORDER — LIDOCAINE HCL 1 % IJ SOLN
INTRAMUSCULAR | Status: AC
  Filled 2023-04-13: qty 60

## 2023-04-13 MED ORDER — SODIUM CHLORIDE 0.9 % IV SOLN: 250.0000 mL | INTRAVENOUS | Status: DC

## 2023-04-13 MED ORDER — ONDANSETRON HCL 4 MG/2ML IJ SOLN: 4.0000 mg | Freq: Four times a day (QID) | INTRAMUSCULAR | Status: DC | PRN

## 2023-04-13 MED ORDER — ACETAMINOPHEN 325 MG PO TABS: 325.0000 mg | ORAL_TABLET | ORAL | Status: DC | PRN

## 2023-04-13 MED ORDER — GENTAMICIN SULFATE 40 MG/ML IJ SOLN
INTRAMUSCULAR | Status: AC
  Filled 2023-04-13: qty 2

## 2023-04-13 MED ORDER — FENTANYL CITRATE (PF) 100 MCG/2ML IJ SOLN
INTRAMUSCULAR | Status: AC
  Filled 2023-04-13: qty 2

## 2023-04-13 MED ORDER — FENTANYL CITRATE (PF) 100 MCG/2ML IJ SOLN
INTRAMUSCULAR | Status: DC | PRN
  Administered 2023-04-13 (×6): 25 ug via INTRAVENOUS

## 2023-04-13 MED ORDER — CEFAZOLIN SODIUM-DEXTROSE 2-4 GM/100ML-% IV SOLN
INTRAVENOUS | Status: AC
  Filled 2023-04-13: qty 100

## 2023-04-13 MED ORDER — MIDAZOLAM HCL 5 MG/5ML IJ SOLN
INTRAMUSCULAR | Status: AC
  Filled 2023-04-13: qty 5

## 2023-04-13 MED ORDER — LIDOCAINE HCL (PF) 1 % IJ SOLN
INTRAMUSCULAR | Status: DC | PRN
  Administered 2023-04-13: 60 mL

## 2023-04-13 MED ORDER — CEFAZOLIN SODIUM-DEXTROSE 2-4 GM/100ML-% IV SOLN
2.0000 g | INTRAVENOUS | Status: AC
  Administered 2023-04-13: 2 g via INTRAVENOUS

## 2023-04-13 MED ORDER — SODIUM CHLORIDE 0.9% FLUSH: 3.0000 mL | INTRAVENOUS | Status: DC | PRN

## 2023-04-13 MED ORDER — CEFAZOLIN SODIUM-DEXTROSE 1-4 GM/50ML-% IV SOLN: 1.0000 g | Freq: Four times a day (QID) | INTRAVENOUS | Status: DC

## 2023-04-13 MED ORDER — CHLORHEXIDINE GLUCONATE 4 % EX SOLN: 4.0000 | Freq: Once | CUTANEOUS | Status: DC

## 2023-04-13 MED ORDER — HEPARIN (PORCINE) IN NACL 1000-0.9 UT/500ML-% IV SOLN
INTRAVENOUS | Status: DC | PRN
  Administered 2023-04-13: 500 mL

## 2023-04-13 MED ORDER — SODIUM CHLORIDE 0.9 % IV SOLN
INTRAVENOUS | Status: AC
  Filled 2023-04-13: qty 2

## 2023-04-13 MED ORDER — MIDAZOLAM HCL 5 MG/5ML IJ SOLN
INTRAMUSCULAR | Status: DC | PRN
  Administered 2023-04-13 (×5): 1 mg via INTRAVENOUS

## 2023-04-13 MED ORDER — IODIXANOL 320 MG/ML IV SOLN
INTRAVENOUS | Status: DC | PRN
  Administered 2023-04-13: 5 mL

## 2023-04-13 MED ORDER — SODIUM CHLORIDE 0.9 % IV SOLN
80.0000 mg | INTRAVENOUS | Status: AC
  Administered 2023-04-13: 80 mg

## 2023-04-13 MED ORDER — POVIDONE-IODINE 10 % EX SWAB
2.0000 | Freq: Once | CUTANEOUS | Status: AC
  Administered 2023-04-13: 2 via TOPICAL

## 2023-04-13 SURGICAL SUPPLY — 19 items
BALLN COR SINUS VENO 6FR 80 (BALLOONS) ×1 IMPLANT
BALLOON COR SINUS VENO 6FR 80 (BALLOONS) IMPLANT
CABLE SURGICAL S-101-97-12 (CABLE) ×1 IMPLANT
CATH ATTAIN COM SURV 6250V-MB2 (CATHETERS) IMPLANT
CATH ATTAIN SEL SURV 6248V-90 (CATHETERS) IMPLANT
CATH CPS LOCATOR 3D MED (CATHETERS) IMPLANT
CATH JOSEPH QUAD ALLRED 6F REP (CATHETERS) IMPLANT
ICD COBALT XT QUAD CRT DTPA2QQ (ICD Generator) IMPLANT
LEAD ATTAIN PERFORMA S 4598-88 (Lead) IMPLANT
LEAD CAPSURE NOVUS 5076-52CM (Lead) IMPLANT
LEAD SPRINT QUAT SEC 6935M-62 (Lead) IMPLANT
PAD DEFIB RADIO PHYSIO CONN (PAD) ×1 IMPLANT
SHEATH 7FR PRELUDE SNAP 13 (SHEATH) IMPLANT
SHEATH 9FR PRELUDE SNAP 13 (SHEATH) IMPLANT
SHEATH PROBE COVER 6X72 (BAG) IMPLANT
SLITTER 6232ADJ (MISCELLANEOUS) IMPLANT
TRAY PACEMAKER INSERTION (PACKS) ×1 IMPLANT
WIRE ACUITY WHISPER EDS 4648 (WIRE) IMPLANT
WIRE HI TORQ VERSACORE-J 145CM (WIRE) IMPLANT

## 2023-04-13 NOTE — Interval H&P Note (Signed)
History and Physical Interval Note:  04/13/2023 1:36 PM  Maxwell Harris  has presented today for surgery, with the diagnosis of heart failure.  The various methods of treatment have been discussed with the patient and family. After consideration of risks, benefits and other options for treatment, the patient has consented to  Procedure(s): BIV ICD INSERTION CRT-D (N/A) as a surgical intervention.  The patient's history has been reviewed, patient examined, no change in status, stable for surgery.  I have reviewed the patient's chart and labs.  Questions were answered to the patient's satisfaction.     Wen Merced [employer]

## 2023-04-13 NOTE — Discharge Instructions (Signed)
After Your ICD (Implantable Cardiac Defibrillator)   You have a Med[employer]onic ICD  ACTIVITY Do not lift your arm above shoulder height for 1 week after your procedure. After 7 days, you may progress as below.  You should remove your sling 24 hours after your procedure, unless otherwise instructed by your provider.     Tuesday April 20, 2023  Wednesday April 21, 2023 Thursday April 22, 2023 Friday April 23, 2023   Do not lift, push, pull, or carry anything over 10 pounds with the affected arm until 6 weeks (Tuesday May 25, 2023 ) after your procedure.   You may drive AFTER your wound check, unless you have been told otherwise by your provider.   Ask your healthcare provider when you can go back to work   INCISION/Dressing If you are on a blood thinner such as Coumadin, Xarelto, Eliquis, Plavix, or Pradaxa please confirm with your provider when this should be resumed.   If large square, outer bandage is left in place, this can be removed after 24 hours from your procedure. Do not remove steri-strips or glue as below.   Monitor your defibrillator site for redness, swelling, and drainage. Call the device clinic at [phone] if you experience these symptoms or fever/chills.  If your incision is sealed with Steri-strips or staples, you may shower 7 days after your procedure or when told by your provider. Do not remove the steri-strips or let the shower hit directly on your site. You may wash around your site with soap and water.    If you were discharged in a sling, please do not wear this during the day more than 48 hours after your surgery unless otherwise instructed. This may increase the risk of stiffness and soreness in your shoulder.   Avoid lotions, ointments, or perfumes over your incision until it is well-healed.  You may use a hot tub or a pool AFTER your wound check appointment if the incision is completely closed.  Your ICD is designed to protect you from life  threatening heart rhythms. Because of this, you may receive a shock.   1 shock with no symptoms:  Call the office during business hours. 1 shock with symptoms (chest pain, chest pressure, dizziness, lightheadedness, shortness of breath, overall feeling unwell):  Call 911. If you experience 2 or more shocks in 24 hours:  Call 911. If you receive a shock, you should not drive for 6 months per the [place] DMV IF you receive appropriate therapy from your ICD.   ICD Alerts:  Some alerts are vibratory and others beep. These are NOT emergencies. Please call our office to let us know. If this occurs at night or on weekends, it can wait until the next business day. Send a remote transmission.  If your device is capable of reading fluid status (for heart failure), you will be offered monthly monitoring to review this with you.   DEVICE MANAGEMENT Remote monitoring is used to monitor your ICD from home. This monitoring is scheduled every 91 days by our office. It allows [place] to keep an eye on the functioning of your device to ensure it is working properly. You will routinely see your Electrophysiologist annually (more often if necessary).   You should receive your ID card for your new device in 4-8 weeks. Keep this card with you at all times once received. Consider wearing a medical alert bracelet or necklace.  Your ICD  may be MRI compatible. This will be discussed at your next  office visit/wound check.  You should avoid contact with strong electric or magnetic fields.   Do not use amateur (ham) radio equipment or electric (arc) welding torches. MP3 player headphones with magnets should not be used. Some devices are safe to use if held at least 12 inches (30 cm) from your defibrillator. These include power tools, lawn mowers, and speakers. If you are unsure if something is safe to use, ask your health care provider.  When using your cell phone, hold it to the ear that is on the opposite side from the  defibrillator. Do not leave your cell phone in a pocket over the defibrillator.  You may safely use electric blankets, heating pads, computers, and microwave ovens.  Call the office right away if: You have chest pain. You feel more than one shock. You feel more short of breath than you have felt before. You feel more light-headed than you have felt before. Your incision starts to open up.  This information is not intended to replace advice given to you by your health care provider. Make sure you discuss any questions you have with your health care provider.

## 2023-04-13 NOTE — Progress Notes (Signed)
 Patient and wife was given discharge instructions. Both verbalized understanding.

## 2023-04-14 ENCOUNTER — Encounter (HOSPITAL_COMMUNITY): Payer: Self-pay | Admitting: Cardiology

## 2023-04-22 ENCOUNTER — Ambulatory Visit: Payer: No Typology Code available for payment source

## 2023-04-24 ENCOUNTER — Encounter (HOSPITAL_COMMUNITY): Payer: Self-pay | Admitting: Emergency Medicine

## 2023-04-24 ENCOUNTER — Emergency Department (HOSPITAL_COMMUNITY)
Admission: EM | Admit: 2023-04-24 | Discharge: 2023-04-24 | Disposition: A | Discharge status: Home / Self Care | Attending: Emergency Medicine | Admitting: Emergency Medicine

## 2023-04-24 ENCOUNTER — Emergency Department (HOSPITAL_COMMUNITY)

## 2023-04-24 DIAGNOSIS — M7989 Other specified soft tissue disorders: Secondary | ICD-10-CM | POA: Diagnosis present

## 2023-04-24 DIAGNOSIS — R609 Edema, unspecified: Secondary | ICD-10-CM | POA: Diagnosis not present

## 2023-04-24 DIAGNOSIS — I82622 Acute embolism and thrombosis of deep veins of left upper extremity: Secondary | ICD-10-CM | POA: Diagnosis not present

## 2023-04-24 MED ORDER — APIXABAN (ELIQUIS) VTE STARTER PACK (10MG AND 5MG): ORAL_TABLET | ORAL | 0 refills | Status: DC

## 2023-04-24 MED ORDER — APIXABAN 5 MG PO TABS
10.0000 mg | ORAL_TABLET | Freq: Once | ORAL | Status: AC
  Administered 2023-04-24: 10 mg via ORAL
  Filled 2023-04-24: qty 2

## 2023-04-24 MED ORDER — APIXABAN (ELIQUIS) EDUCATION KIT FOR DVT/PE PATIENTS
PACK | Freq: Once | Status: AC
  Filled 2023-04-24: qty 1

## 2023-04-24 NOTE — ED Triage Notes (Signed)
 Pt here from home with c/o left arm swelling for about two days pt had pacemaker placed back on 18 th of feb , no pain in arm

## 2023-04-24 NOTE — ED Provider Triage Note (Signed)
 Emergency Medicine Provider Triage Evaluation Note  Maxwell Harris , a 68 y.o. male  was evaluated in triage.  Pt complains of LUE swelling x2 days. Pacemaker placed on 2/18. Denies any numbness or tingling.   Review of Systems  Positive: Arm pain Negative: numbness  Physical Exam  BP 128/76   Pulse 84   Temp 98.7 F (37.1 C)   Resp 18   SpO2 97%  Gen:   Awake, no distress   Resp:  Normal effort  MSK:   Moves extremities without difficulty  Other:  +radial pulse of LUE, No ttp, mild edema  Medical Decision Making  Medically screening exam initiated at 6:01 PM.  Appropriate orders placed.  Alpha Gula was informed that the remainder of the evaluation will be completed by another provider, this initial triage assessment does not replace that evaluation, and the importance of remaining in the ED until their evaluation is complete.     Pete Pelt, [place] 04/24/23 [phone]

## 2023-04-24 NOTE — Progress Notes (Signed)
 VASCULAR LAB    Left upper extremity venous duplex has been performed.  See CV proc for preliminary results.  Gave verbal report to [place], RN  Antwan Pandya, RVT 04/24/2023, 7:06 PM

## 2023-04-24 NOTE — ED Provider Notes (Signed)
 [place] EMERGENCY DEPARTMENT AT [facility] Provider Note   CSN: [id number] Arrival date & time: 04/24/23  1654     History Chief Complaint  Patient presents with   Arm Swelling    HPI Maxwell Harris is a 68 y.o. male presenting for left arm swelling and discomfort. Recently had a defib placed. Left arm started swelling yesterday.  Began to hurt today.  Denies fevers chills nausea vomiting shortness of breath.  He is ambulatory tolerating p.o. intake on arrival.  Patient's recorded medical, surgical, social, medication list and allergies were reviewed in the Snapshot window as part of the initial history.   Review of Systems   Review of Systems  Constitutional:  Negative for chills and fever.  HENT:  Negative for ear pain and sore throat.   Eyes:  Negative for pain and visual disturbance.  Respiratory:  Negative for cough and shortness of breath.   Cardiovascular:  Negative for chest pain and palpitations.  Gastrointestinal:  Negative for abdominal pain and vomiting.  Genitourinary:  Negative for dysuria and hematuria.  Musculoskeletal:  Negative for arthralgias and back pain.  Skin:  Negative for color change and rash.  Neurological:  Negative for seizures and syncope.  All other systems reviewed and are negative.   Physical Exam Updated Vital Signs BP 128/76   Pulse 84   Temp 98.7 F (37.1 C)   Resp 18   SpO2 97%  Physical Exam Vitals and nursing note reviewed.  Constitutional:      General: He is not in acute distress.    Appearance: He is well-developed.  HENT:     Head: Normocephalic and atraumatic.  Eyes:     Conjunctiva/sclera: Conjunctivae normal.  Cardiovascular:     Rate and Rhythm: Normal rate and regular rhythm.  Pulmonary:     Effort: Pulmonary effort is normal. No respiratory distress.  Abdominal:     General: Abdomen is flat. There is no distension.  Musculoskeletal:        General: Swelling (2+ left arm swelling.) present. No  deformity.  Skin:    General: Skin is warm and dry.     Capillary Refill: Capillary refill takes less than 2 seconds.  Neurological:     Mental Status: He is alert and oriented to person, place, and time. Mental status is at baseline.      ED Course/ Medical Decision Making/ A&P    Procedures Procedures   Medications Ordered in ED Medications  apixaban (ELIQUIS) tablet 10 mg (has no administration in time range)  apixaban (ELIQUIS) Education Kit for DVT/PE patients (has no administration in time range)    Medical Decision Making:   68 year old male presenting with left arm swelling after procedure.  He appears to have an upper extremity DVT on ultrasound. Discussed with patient.  He has no chest pain shortness of breath or concern for stroke or PE at this time. Will start him on Eliquis p.o.  Substantial amount of time spent educating patient on Eliquis. He will follow-up in the DVT clinic or at the [facility] for definitive care and management in the long-term.  Disposition:  I have considered need for hospitalization, however, considering all of the above, I believe this patient is stable for discharge at this time.  Patient/family educated about specific return precautions for given chief complaint and symptoms.  Patient/family educated about follow-up with PCP.     Patient/family expressed understanding of return precautions and need for follow-up. Patient spoken to regarding  all imaging and laboratory results and appropriate follow up for these results. All education provided in verbal form with additional information in written form. Time was allowed for answering of patient questions. Patient discharged.    Emergency Department Medication Summary:   Medications  apixaban (ELIQUIS) tablet 10 mg (has no administration in time range)  apixaban (ELIQUIS) Education Kit for DVT/PE patients (has no administration in time range)     Clinical Impression:  1. Acute deep vein  thrombosis (DVT) of left upper extremity, unspecified vein (HCC)      Data Unavailable   Final Clinical Impression(s) / ED Diagnoses Final diagnoses:  Acute deep vein thrombosis (DVT) of left upper extremity, unspecified vein (HCC)    Rx / DC Orders ED Discharge Orders          Ordered    AMB Referral to Deep Vein Thrombosis Clinic        04/24/23 2009    APIXABAN (ELIQUIS) VTE STARTER PACK (10MG  AND 5MG )       Note to Pharmacy: If starter pack unavailable, substitute with seventy-four 5 mg apixaban tabs following the above SIG directions.   04/24/23 2010              Glyn Ade, MD 04/24/23 2010

## 2023-04-24 NOTE — Discharge Instructions (Addendum)
Information on my medicine - ELIQUIS (apixaban)  This medication education was reviewed with me or my healthcare representative as part of my discharge preparation.   Why was Eliquis prescribed for you? Eliquis was prescribed to treat blood clots that may have been found in the veins of your legs (deep vein thrombosis) or in your lungs (pulmonary embolism) and to reduce the risk of them occurring again.  What do You need to know about Eliquis ? The starting dose is 10 mg (two 5 mg tablets) taken TWICE daily for the FIRST SEVEN (7) DAYS, then the dose is reduced to ONE 5 mg tablet taken TWICE daily.  Eliquis may be taken with or without food.   Try to take the dose about the same time in the morning and in the evening. If you have difficulty swallowing the tablet whole please discuss with your pharmacist how to take the medication safely.  Take Eliquis exactly as prescribed and DO NOT stop taking Eliquis without talking to the doctor who prescribed the medication.  Stopping may increase your risk of developing a new blood clot.  Refill your prescription before you run out.  After discharge, you should have regular check-up appointments with your healthcare provider that is prescribing your Eliquis.    What do you do if you miss a dose? If a dose of ELIQUIS is not taken at the scheduled time, take it as soon as possible on the same day and twice-daily administration should be resumed. The dose should not be doubled to make up for a missed dose.  Important Safety Information A possible side effect of Eliquis is bleeding. You should call your healthcare provider right away if you experience any of the following: Bleeding from an injury or your nose that does not stop. Unusual colored urine (red or dark brown) or unusual colored stools (red or black). Unusual bruising for unknown reasons. A serious fall or if you hit your head (even if there is no bleeding).  Some medicines may  interact with Eliquis and might increase your risk of bleeding or clotting while on Eliquis. To help avoid this, consult your healthcare provider or pharmacist prior to using any new prescription or non-prescription medications, including herbals, vitamins, non-steroidal anti-inflammatory drugs (NSAIDs) and supplements.  This website has more information on Eliquis (apixaban): http://www.eliquis.com/eliquis/home  

## 2023-04-27 NOTE — Patient Instructions (Signed)

## 2023-04-28 ENCOUNTER — Telehealth: Payer: Self-pay

## 2023-04-28 ENCOUNTER — Ambulatory Visit: Payer: No Typology Code available for payment source | Attending: Cardiology

## 2023-04-28 DIAGNOSIS — I255 Ischemic cardiomyopathy: Secondary | ICD-10-CM

## 2023-04-28 LAB — CUP PACEART INCLINIC DEVICE CHECK
Date Time Interrogation Session: 20250305200729
Implantable Lead Connection Status: 753985
Implantable Lead Connection Status: 753985
Implantable Lead Connection Status: 753985
Implantable Lead Implant Date: 20250218
Implantable Lead Implant Date: 20250218
Implantable Lead Implant Date: 20250218
Implantable Lead Location: 753858
Implantable Lead Location: 753859
Implantable Lead Location: 753860
Implantable Lead Model: 4598
Implantable Lead Model: 5076
Implantable Pulse Generator Implant Date: 20250218

## 2023-04-28 NOTE — Progress Notes (Signed)
 Normal ICD wound check. Wound well healed. Thresholds, sensing, and impedances consistent with implant measurements with 3.5V safety margin/auto capture until 3 month visit. No episodes.  Reviewed arm restrictions to continue for 6 weeks total post op. Reviewed shock plan and alarm tones.  Pt enrolled in remote follow-up. BIVP: 97.2%.    NOTE: Patient was in the ER 2 days ago due to developing a DVT to his left arm.  There is noted swelling of the arm; however, patient says it is improving.  He denies any pain.  ER placed on Eliquis and he has follow up with DVT clinic on Monday 05/03/23.  Wound site is well healed, no signs of swelling or hematoma.  Patient given monitoring instructions if begins to swell or has any s/s of infection he is to call the Device Clinic right away.  Patient verbalizes understanding.  Phone note sent to Dr. Elberta Fortis to make aware of DVT development and tx plan.

## 2023-04-28 NOTE — Telephone Encounter (Signed)
 Patient in to device clinic for 10day post-op new  BIV ICD implant for ICM/LBBB.   He reports on 04/24/2023 he went to ER and was dx with a Left Arm DVT.  ER placed him on Eliquis and set him up with DVT clinic for Monday 05/03/23 for follow up.   In clinic today the left arm is noticeably swollen.  Patient reports that it has gone down since the weekend and he denies any pain to the area, but take Tylenol as needed for general post procedure aches/pains.  Confirms he is taking his blood thinner as prescribed.   Device site wound has healed WNL, there is no sign of hematoma, edges well approximated, no s/s of infection.  Wound site monitoring instructions given, he is to contact our office immediately if notices any swelling at site or signs of infection.

## 2023-05-03 ENCOUNTER — Ambulatory Visit (HOSPITAL_COMMUNITY)
Admission: RE | Admit: 2023-05-03 | Discharge: 2023-05-03 | Disposition: A | Discharge status: Home / Self Care | Source: Ambulatory Visit | Attending: Vascular Surgery | Admitting: Vascular Surgery

## 2023-05-03 ENCOUNTER — Encounter (HOSPITAL_COMMUNITY): Payer: Self-pay | Admitting: Student-PharmD

## 2023-05-03 VITALS — BP 122/72 | HR 60

## 2023-05-03 DIAGNOSIS — I82A12 Acute embolism and thrombosis of left axillary vein: Secondary | ICD-10-CM | POA: Diagnosis present

## 2023-05-03 MED ORDER — APIXABAN 5 MG PO TABS: 5.0000 mg | ORAL_TABLET | Freq: Two times a day (BID) | ORAL | 1 refills | Status: AC

## 2023-05-03 NOTE — Patient Instructions (Signed)
-  Continue apixaban (Eliquis) 5 mg twice daily. -Your refills have been sent to your [facility]. You may need to call the pharmacy to ask them to fill this when you start to run low on your current supply.  -Work on elevating your arm to help improve your swelling.  -It is important to take your medication around the same time every day.  -Avoid NSAIDs like ibuprofen (Advil, Motrin) and naproxen (Aleve) as well as aspirin doses over 100 mg daily. -Tylenol (acetaminophen) is the preferred over the counter pain medication to lower the risk of bleeding. -Be sure to alert all of your health care providers that you are taking an anticoagulant prior to starting a new medication or having a procedure. -Monitor for signs and symptoms of bleeding (abnormal bruising, prolonged bleeding, nose bleeds, bleeding from gums, discolored urine, black tarry stools). If you have fallen and hit your head OR if your bleeding is severe or not stopping, seek emergency care.  -Go to the emergency room if emergent signs and symptoms of new clot occur (new or worse swelling and pain in an arm or leg, shortness of breath, chest pain, fast or irregular heartbeats, lightheadedness, dizziness, fainting, coughing up blood) or if you experience a significant color change (pale or blue) in the extremity that has the DVT.   Your next visit is on Tuesday June 3rd at 9:30am.  [facility] [address] [place], [place], [place] [address] Enter the hospital through Entrance C off [facility] and pull up to the Heart & Vascular Center entrance to the free valet parking.  Check in for your appointment at the Heart & Vascular Center.   **As of June 21, 2023, our location is changing** We will be located at [facility] at:  [address], [address] Floor, [place], [place] [address] Phone number as of 06/21/23 will be [phone].   If you have any questions or need to reschedule an  appointment, please call [phone] [facility] (active number until 06/21/23).  If you are having an emergency, call 911 or present to the nearest emergency room.   What is a DVT?  -Deep vein thrombosis (DVT) is a condition in which a blood clot forms in a vein of the deep venous system which can occur in the lower leg, thigh, pelvis, arm, or neck. This condition is serious and can be life-threatening if the clot travels to the arteries of the lungs and causing a blockage (pulmonary embolism, PE). A DVT can also damage veins in the leg, which can lead to long-term venous disease, leg pain, swelling, discoloration, and ulcers or sores (post-thrombotic syndrome).  -Treatment may include taking an anticoagulant medication to prevent more clots from forming and the current clot from growing, wearing compression stockings, and/or surgical procedures to remove or dissolve the clot.

## 2023-05-03 NOTE — Progress Notes (Signed)
 DVT Clinic Note  Name: Maxwell Harris     MRN: [id number]     DOB: [birth year]     Sex: male  PCP: Sherwood Gambler, MD  Today's Visit: Visit Information: Initial Visit  Referred to DVT Clinic by: Emergency Department - Dr. Doran Durand Referred to CPP by: Dr. Chestine Spore Reason for referral:  Chief Complaint  Patient presents with   DVT   HISTORY OF PRESENT ILLNESS: Maxwell Harris is a 68 y.o. male with PMH HTN, HF recently diagnosed 09/2022 followed by [facility], LBBB, OSA on CPAP, iron deficiency anemia, GERD, alcohol, THC, and cocaine use, who presents after diagnosis of DVT for medication management. He receives most of his care through the [place]. Patient recently had ICD placed 04/13/23, developed left arm swelling and pain on 04/23/23 and presented to ED 04/24/23. There he was diagnosed with an acute DVT in his left axillary and brachial veins. Eliquis was started and he was referred to DVT Clinic for follow up. He has since been seen for a wound check s/p ICD placement and is healing well.   Today patient reports that the pain in his left arm has resolved since starting Eliquis. His swelling has started to improve but has not resolved yet. Denies any prior history of VTE. Denies abnormal bleeding or bruising since starting Eliquis. Denies missed doses of Eliquis, and he takes it at 9am and 10pm. Has not been elevating arm properly to help swelling (just laying on armrest). He requests refills to his [place] pharmacy.  Positive Thrombotic Risk Factors: Recent surgery (within 3 months), Older Age Bleeding Risk Factors: Age >65 years, Anemia, Anticoagulant therapy  Negative Thrombotic Risk Factors: Previous VTE, Recent trauma (within 3 months), Recent admission to hospital with acute illness (within 3 months), Paralysis, paresis, or recent plaster cast immobilization of lower extremity, Central venous catheterization, Bed rest >72 hours within 3 months, Sedentary journey lasting >8 hours  within 4 weeks, Pregnancy, Within 6 weeks postpartum, Recent cesarean section (within 3 months), Estrogen therapy, Testosterone therapy, Erythropoiesis-stimulating agent, Recent COVID diagnosis (within 3 months), Smoking, Active cancer, Non-malignant, chronic inflammatory condition, Known thrombophilic condition, Obesity  Rx Insurance Coverage:  [employer] Rx Affordability: Patient used one time free card to fill starter pack. Expect refills to be affordable with [facility] benefits.  Rx Assistance Provided:  No medication assistance needed at this time but patient will reach out with any issues. Preferred Pharmacy: Refills sent to [facility]   Past Medical History:  Diagnosis Date   Chronic GERD    Hypertension    Pain    Pre-diabetes     Past Surgical History:  Procedure Laterality Date   BIV ICD INSERTION CRT-D N/A 04/13/2023   Procedure: BIV ICD INSERTION CRT-D;  Surgeon: Regan Lemming, MD;  Location: [facility] INVASIVE CV LAB;  Service: Cardiovascular;  Laterality: N/A;   COLONOSCOPY     ESOPHAGOGASTRODUODENOSCOPY     RIGHT/LEFT HEART CATH AND CORONARY ANGIOGRAPHY N/A 10/22/2022   Procedure: RIGHT/LEFT HEART CATH AND CORONARY ANGIOGRAPHY;  Surgeon: Laurey Morale, MD;  Location: [facility] INVASIVE CV LAB;  Service: Cardiovascular;  Laterality: N/A;    Social History   Socioeconomic History   Marital status: Single    Spouse name: Not on file   Number of children: 1   Years of education: Not on file   Highest education level: Not on file  Occupational History   Occupation: retired  Tobacco Use   Smoking status:  Former    Types: Cigarettes   Smokeless tobacco: Never   Tobacco comments:    Not able to remember when he quit  Vaping Use   Vaping status: Never Used  Substance and Sexual Activity   Alcohol use: Yes    Alcohol/week: 1.0 standard drink of alcohol    Types: 1 Cans of beer per week    Comment: social   Drug use: Not Currently    Frequency:  1.0 times per week    Types: Marijuana   Sexual activity: Not Currently  Other Topics Concern   Not on file  Social History Narrative   Not on file   Social Drivers of Health   Financial Resource Strain: Not on file  Food Insecurity: No Food Insecurity (10/20/2022)   Hunger Vital Sign    Worried About Running Out of Food in the Last Year: Never true    Ran Out of Food in the Last Year: Never true  Transportation Needs: No Transportation Needs (10/20/2022)   PRAPARE - Administrator, Civil Service (Medical): No    Lack of Transportation (Non-Medical): No  Physical Activity: Not on file  Stress: Not on file  Social Connections: Unknown (07/03/2021)   [facility]   Social Network    Social Network: Not on file  Intimate Partner Violence: Not At Risk (10/20/2022)   Humiliation, Afraid, Rape, and Kick questionnaire    Fear of Current or Ex-Partner: No    Emotionally Abused: No    Physically Abused: No    Sexually Abused: No    Family History  Problem Relation Age of Onset   Stroke Mother    Other Father        patient was a kid when his Dad died, not sure of the cause   Colon cancer Neg Hx    Esophageal cancer Neg Hx     Allergies as of 05/03/2023 - Review Complete 05/03/2023  Allergen Reaction Noted   Lisinopril Swelling 06/12/2020   Shellfish allergy Hives 07/24/2011    Current Outpatient Medications on File Prior to Encounter  Medication Sig Dispense Refill   APIXABAN (ELIQUIS) VTE STARTER PACK (10MG  AND 5MG ) Take as directed on package: start with two-5mg  tablets twice daily for 7 days. On day 8, switch to one-5mg  tablet twice daily. 74 each 0   acetaminophen (TYLENOL) 500 MG tablet Take 500-1,000 mg by mouth every 6 (six) hours as needed for moderate pain (pain score 4-6). joint     carvedilol (COREG) 6.25 MG tablet TAKE 1 TABLET BY MOUTH 2 TIMES DAILY WITH A MEAL. 180 tablet 1   Cholecalciferol (VITAMIN D) 50 MCG (2000 UT)  tablet Take 2,000 Units by mouth daily.     cycloSPORINE (RESTASIS) 0.05 % ophthalmic emulsion Place 1 drop into both eyes daily.     diclofenac Sodium (VOLTAREN) 1 % GEL Apply 2 g topically 4 (four) times daily as needed (pain).     DULoxetine (CYMBALTA) 20 MG capsule Take 40 mg by mouth daily.     empagliflozin (JARDIANCE) 25 MG TABS tablet Take 1/2 tablet daily 45 tablet 3   gabapentin (NEURONTIN) 300 MG capsule Take 300 mg by mouth 2 (two) times daily.     lidocaine (LIDODERM) 5 % Place 1 patch onto the skin daily as needed (pain).     losartan (COZAAR) 25 MG tablet Take 1 tablet (25 mg total) by mouth 2 (two) times daily. (Patient taking differently: Take 25  mg by mouth daily.) 60 tablet 3   omeprazole (PRILOSEC) 20 MG capsule Take 20 mg by mouth daily.     potassium chloride SA (KLOR-CON M) 20 MEQ tablet Take 1 tablet (20 mEq total) by mouth daily. 90 tablet 3   rosuvastatin (CRESTOR) 10 MG tablet Take 1 tablet (10 mg total) by mouth daily. 90 tablet 3   sildenafil (VIAGRA) 100 MG tablet Take 100 mg by mouth as needed for erectile dysfunction.     spironolactone (ALDACTONE) 25 MG tablet Take 1 tablet (25 mg total) by mouth daily. 90 tablet 3   torsemide (DEMADEX) 20 MG tablet Take 1 tablet (20 mg total) by mouth daily. 90 tablet 3   No current facility-administered medications on file prior to encounter.   REVIEW OF SYSTEMS:  Review of Systems  Respiratory:  Negative for shortness of breath.   Cardiovascular:  Negative for chest pain and palpitations.  Musculoskeletal:  Negative for myalgias.  Neurological:  Positive for tingling. Negative for dizziness.   PHYSICAL EXAMINATION:  Vitals:   05/03/23 0944  BP: 122/72  Pulse: 60  SpO2: 96%    Physical Exam Vitals reviewed.  Cardiovascular:     Rate and Rhythm: Normal rate.  Pulmonary:     Effort: Pulmonary effort is normal.  Musculoskeletal:        General: Swelling (left arm through hand) present. No tenderness.  Skin:     Findings: No bruising or erythema.  Psychiatric:        Mood and Affect: Mood normal.        Behavior: Behavior normal.        Thought Content: Thought content normal.   LABS:  CBC     Component Value Date/Time   WBC 5.0 03/23/2023 1041   WBC 5.0 11/24/2022 1335   WBC 5.9 10/23/2022 0307   RBC 5.07 03/23/2023 1041   RBC 4.95 11/24/2022 1335   HGB 14.2 03/23/2023 1041   HCT 44.0 03/23/2023 1041   PLT 289 03/23/2023 1041   MCV 87 03/23/2023 1041   MCH 28.0 03/23/2023 1041   MCH 28.7 11/24/2022 1335   MCHC 32.3 03/23/2023 1041   MCHC 34.2 11/24/2022 1335   RDW 14.5 03/23/2023 1041   LYMPHSABS 1.1 11/24/2022 1335   MONOABS 0.7 11/24/2022 1335   EOSABS 0.2 11/24/2022 1335   BASOSABS 0.0 11/24/2022 1335    Hepatic Function      Component Value Date/Time   PROT 7.2 10/20/2022 1222   ALBUMIN 3.7 10/20/2022 1222   AST 55 (H) 10/20/2022 1222   AST 27 08/25/2022 1331   ALT 69 (H) 10/20/2022 1222   ALT 30 08/25/2022 1331   ALKPHOS 75 10/20/2022 1222   BILITOT 0.3 10/20/2022 1222   BILITOT 0.5 08/25/2022 1331    Renal Function   Lab Results  Component Value Date   CREATININE 1.55 (H) 03/23/2023   CREATININE 1.39 (H) 02/11/2023   CREATININE 1.59 (H) 02/01/2023    CrCl cannot be calculated (Patient's most recent lab result is older than the maximum 21 days allowed.).   [facility] Vascular Lab Studies:  04/24/23 VAS [place] UPPER EXTREMITY VENOUS DUPLEX LEFT  Summary:  Right:  No evidence of thrombosis in the subclavian.   Left:  No evidence of superficial vein thrombosis in the upper extremity.  Findings consistent with acute deep vein thrombosis involving the left axillary  vein and left brachial veins.   ASSESSMENT: Location of DVT: Left upper extremity Cause of DVT: provoked by a  transient risk factor  Patient diagnosed with acute DVT in his left axillary and brachial veins 04/24/23, shortly after ICD placement 04/13/23, and was started on Eliquis. LUE pain has resolved, swelling  persists but is improving. Discussed patient with Dr. Chestine Spore, DVT felt to be provoked by recent surgery for ICD insertion, recommend anticoagulating with Eliquis for 3 months. Patient is now 1 week into taking the Eliquis starter pack. Provided him with refills to complete 3 months of treatment to his preferred pharmacy. Counseled patient extensively on Eliquis. He is taking it correctly. No concerns related to medication access or adherence at this time. Discussed importance of elevating his arm to help improve his swelling. All of his questions have been answered at this time.  PLAN: -Continue apixaban (Eliquis) 5 mg twice daily. -Expected duration of therapy: 3 months. Therapy started on 04/24/23. -Patient educated on purpose, proper use and potential adverse effects of apixaban (Eliquis). -Discussed importance of taking medication around the same time every day. -Advised patient of medications to avoid (NSAIDs, aspirin doses >100 mg daily). -Educated that Tylenol (acetaminophen) is the preferred analgesic to lower the risk of bleeding. -Advised patient to alert all providers of anticoagulation therapy prior to starting a new medication or having a procedure. -Emphasized importance of monitoring for signs and symptoms of bleeding (abnormal bruising, prolonged bleeding, nose bleeds, bleeding from gums, discolored urine, black tarry stools). -Educated patient to present to the ED if emergent signs and symptoms of new thrombosis occur. -Counseled patient to elevate arm to help improve swelling. Activity restrictions per instructions given by device clinic.   Follow up: 3 months for end of treatment visit  Pervis Hocking, PharmD, BCACP, CPP Deep Vein Thrombosis Clinic Clinical Pharmacist Practitioner

## 2023-05-07 NOTE — Addendum Note (Signed)
 Encounter addended by: Howell Rucks, RDCS on: 05/07/2023 12:58 PM  Actions taken: Imaging Exam ended

## 2023-05-24 ENCOUNTER — Other Ambulatory Visit: Payer: Self-pay | Admitting: Physician Assistant

## 2023-05-24 DIAGNOSIS — D509 Iron deficiency anemia, unspecified: Secondary | ICD-10-CM

## 2023-05-25 ENCOUNTER — Other Ambulatory Visit: Payer: Non-veteran care

## 2023-05-25 ENCOUNTER — Inpatient Hospital Stay (HOSPITAL_BASED_OUTPATIENT_CLINIC_OR_DEPARTMENT_OTHER): Payer: Self-pay | Admitting: Physician Assistant

## 2023-05-25 ENCOUNTER — Ambulatory Visit: Payer: Non-veteran care | Admitting: Physician Assistant

## 2023-05-25 ENCOUNTER — Inpatient Hospital Stay: Payer: Self-pay | Attending: Physician Assistant

## 2023-05-25 VITALS — BP 139/86 | HR 75 | Temp 97.9°F | Resp 16 | Wt 240.7 lb

## 2023-05-25 DIAGNOSIS — Z87891 Personal history of nicotine dependence: Secondary | ICD-10-CM | POA: Diagnosis not present

## 2023-05-25 DIAGNOSIS — D509 Iron deficiency anemia, unspecified: Secondary | ICD-10-CM | POA: Insufficient documentation

## 2023-05-25 LAB — CBC WITH DIFFERENTIAL (CANCER CENTER ONLY)
Abs Immature Granulocytes: 0.01 10*3/uL (ref 0.00–0.07)
Basophils Absolute: 0 10*3/uL (ref 0.0–0.1)
Basophils Relative: 1 %
Eosinophils Absolute: 0.2 10*3/uL (ref 0.0–0.5)
Eosinophils Relative: 3 %
HCT: 38.2 % — ABNORMAL LOW (ref 39.0–52.0)
Hemoglobin: 13.1 g/dL (ref 13.0–17.0)
Immature Granulocytes: 0 %
Lymphocytes Relative: 18 %
Lymphs Abs: 1 10*3/uL (ref 0.7–4.0)
MCH: 28.4 pg (ref 26.0–34.0)
MCHC: 34.3 g/dL (ref 30.0–36.0)
MCV: 82.7 fL (ref 80.0–100.0)
Monocytes Absolute: 0.5 10*3/uL (ref 0.1–1.0)
Monocytes Relative: 9 %
Neutro Abs: 3.9 10*3/uL (ref 1.7–7.7)
Neutrophils Relative %: 69 %
Platelet Count: 262 10*3/uL (ref 150–400)
RBC: 4.62 MIL/uL (ref 4.22–5.81)
RDW: 13.9 % (ref 11.5–15.5)
WBC Count: 5.7 10*3/uL (ref 4.0–10.5)
nRBC: 0 % (ref 0.0–0.2)

## 2023-05-25 LAB — IRON AND IRON BINDING CAPACITY (CC-WL,HP ONLY)
Iron: 83 ug/dL (ref 45–182)
Saturation Ratios: 16 % — ABNORMAL LOW (ref 17.9–39.5)
TIBC: 529 ug/dL — ABNORMAL HIGH (ref 250–450)
UIBC: 446 ug/dL — ABNORMAL HIGH (ref 117–376)

## 2023-05-25 LAB — FERRITIN: Ferritin: 53 ng/mL (ref 24–336)

## 2023-05-25 NOTE — Progress Notes (Unsigned)
 [facility] [phone]   [phone]  PROGRESS NOTE  Patient Care Team: Borum, Vista Mink, MD as PCP - General (Internal Medicine) Regan Lemming, MD as PCP - Electrophysiology (Cardiology) Laurey Morale, MD as PCP - Advanced Heart Failure (Cardiology)  Hematological/Oncological History 03/03/2022: Labs from [place]: WBC 6.41, Hgb 7.9 (L), MCV 63.8 (L), Plt 349, Iron 17 (L), TIBC 591 (H), Ferritin 9.9 (L), Saturation 2.9% (L). 03/27/2022: Establish care with [facility] Hematology 04/07/2022: Received IV monoferric 1000 mg x 1 dose 06/03/2022: Received IV monoferric 1000 mg x 1 dose 12/01/2022: Received IV feraheme 510 mg x 1 dose  CHIEF COMPLAINTS/PURPOSE OF CONSULTATION:  Iron deficiency anemia.  HISTORY OF PRESENTING ILLNESS:  Maxwell Harris 68 y.o. male returns for a follow up for iron deficiency anemia.  He was last seen on 11/24/2022. In the interim, he received IV feraheme 510 mg x 1 dose on 12/01/2022. In addition, he underwent ICD placement on 04/13/2023 and then developed acute DVT in left upper extremity shortly after. He is currently on Eliquis therapy.   On exam today, Maxwell Harris reports he is doing well with stable energy and appetite. He denies nausea, vomiting or bowel habit changes. He denies easy bruising or signs of bleeding.  He has occasional shortness of breath mainly with exertion. Patient denies fevers, chills or sweats, chest pain or cough.  He has no other complaints.  Rest of the 10 point ROS is below.   MEDICAL HISTORY:  Past Medical History:  Diagnosis Date   Chronic GERD    Hypertension    Pain    Pre-diabetes     SURGICAL HISTORY: Past Surgical History:  Procedure Laterality Date   BIV ICD INSERTION CRT-D N/A 04/13/2023   Procedure: BIV ICD INSERTION CRT-D;  Surgeon: Regan Lemming, MD;  Location: [facility] INVASIVE CV LAB;  Service: Cardiovascular;  Laterality: N/A;   COLONOSCOPY     ESOPHAGOGASTRODUODENOSCOPY      RIGHT/LEFT HEART CATH AND CORONARY ANGIOGRAPHY N/A 10/22/2022   Procedure: RIGHT/LEFT HEART CATH AND CORONARY ANGIOGRAPHY;  Surgeon: Laurey Morale, MD;  Location: [facility] INVASIVE CV LAB;  Service: Cardiovascular;  Laterality: N/A;    SOCIAL HISTORY: Social History   Socioeconomic History   Marital status: Single    Spouse name: Not on file   Number of children: 1   Years of education: Not on file   Highest education level: Not on file  Occupational History   Occupation: retired  Tobacco Use   Smoking status: Former    Types: Cigarettes   Smokeless tobacco: Never   Tobacco comments:    Not able to remember when he quit  Vaping Use   Vaping status: Never Used  Substance and Sexual Activity   Alcohol use: Yes    Alcohol/week: 1.0 standard drink of alcohol    Types: 1 Cans of beer per week    Comment: social   Drug use: Not Currently    Frequency: 1.0 times per week    Types: Marijuana   Sexual activity: Not Currently  Other Topics Concern   Not on file  Social History Narrative   Not on file   Social Drivers of Health   Financial Resource Strain: Not on file  Food Insecurity: No Food Insecurity (10/20/2022)   Hunger Vital Sign    Worried About Running Out of Food in the Last Year: Never true    Ran Out of Food in the Last Year: Never true  Transportation Needs: No Transportation Needs (10/20/2022)   PRAPARE - Administrator, Civil Service (Medical): No    Lack of Transportation (Non-Medical): No  Physical Activity: Not on file  Stress: Not on file  Social Connections: Unknown (07/03/2021)   Received from [facility]   Social Network    Social Network: Not on file  Intimate Partner Violence: Not At Risk (10/20/2022)   Humiliation, Afraid, Rape, and Kick questionnaire    Fear of Current or Ex-Partner: No    Emotionally Abused: No    Physically Abused: No    Sexually Abused: No    FAMILY HISTORY: Family History  Problem Relation Age  of Onset   Stroke Mother    Other Father        patient was a kid when his Dad died, not sure of the cause   Colon cancer Neg Hx    Esophageal cancer Neg Hx     ALLERGIES:  is allergic to lisinopril and shellfish allergy.  MEDICATIONS:  Current Outpatient Medications  Medication Sig Dispense Refill   acetaminophen (TYLENOL) 500 MG tablet Take 500-1,000 mg by mouth every 6 (six) hours as needed for moderate pain (pain score 4-6). joint     apixaban (ELIQUIS) 5 MG TABS tablet Take 1 tablet (5 mg total) by mouth 2 (two) times daily. Start taking after completion of starter pack. 60 tablet 1   APIXABAN (ELIQUIS) VTE STARTER PACK (10MG  AND 5MG ) Take as directed on package: start with two-5mg  tablets twice daily for 7 days. On day 8, switch to one-5mg  tablet twice daily. 74 each 0   carvedilol (COREG) 6.25 MG tablet TAKE 1 TABLET BY MOUTH 2 TIMES DAILY WITH A MEAL. 180 tablet 1   Cholecalciferol (VITAMIN D) 50 MCG (2000 UT) tablet Take 2,000 Units by mouth daily.     cycloSPORINE (RESTASIS) 0.05 % ophthalmic emulsion Place 1 drop into both eyes daily.     diclofenac Sodium (VOLTAREN) 1 % GEL Apply 2 g topically 4 (four) times daily as needed (pain).     DULoxetine (CYMBALTA) 20 MG capsule Take 40 mg by mouth daily.     empagliflozin (JARDIANCE) 25 MG TABS tablet Take 1/2 tablet daily 45 tablet 3   gabapentin (NEURONTIN) 300 MG capsule Take 300 mg by mouth 2 (two) times daily.     lidocaine (LIDODERM) 5 % Place 1 patch onto the skin daily as needed (pain).     losartan (COZAAR) 25 MG tablet Take 1 tablet (25 mg total) by mouth 2 (two) times daily. (Patient taking differently: Take 25 mg by mouth daily.) 60 tablet 3   omeprazole (PRILOSEC) 20 MG capsule Take 20 mg by mouth daily.     potassium chloride SA (KLOR-CON M) 20 MEQ tablet Take 1 tablet (20 mEq total) by mouth daily. 90 tablet 3   rosuvastatin (CRESTOR) 10 MG tablet Take 1 tablet (10 mg total) by mouth daily. 90 tablet 3   sildenafil  (VIAGRA) 100 MG tablet Take 100 mg by mouth as needed for erectile dysfunction.     spironolactone (ALDACTONE) 25 MG tablet Take 1 tablet (25 mg total) by mouth daily. 90 tablet 3   torsemide (DEMADEX) 20 MG tablet Take 1 tablet (20 mg total) by mouth daily. 90 tablet 3   No current facility-administered medications for this visit.    REVIEW OF SYSTEMS:   Constitutional: ( - ) fevers, ( - )  chills , ( - ) night sweats  Eyes: ( - ) blurriness of vision, ( - ) double vision, ( - ) watery eyes Ears, nose, mouth, throat, and face: ( - ) mucositis, ( - ) sore throat Respiratory: ( - ) cough, (-) dyspnea, ( - ) wheezes Cardiovascular: ( - ) palpitation, ( - ) chest discomfort, ( - ) lower extremity swelling Gastrointestinal:  ( - ) nausea, ( - ) heartburn, ( - ) change in bowel habits Skin: ( - ) abnormal skin rashes Lymphatics: ( - ) new lymphadenopathy, ( - ) easy bruising Neurological: ( - ) numbness, ( - ) tingling, ( - ) new weaknesses Behavioral/Psych: ( - ) mood change, ( - ) new changes  All other systems were reviewed with the patient and are negative.  PHYSICAL EXAMINATION: ECOG PERFORMANCE STATUS: 0 - Asymptomatic  Vitals:   05/25/23 1257  BP: 139/86  Pulse: 75  Resp: 16  Temp: 97.9 F (36.6 C)  SpO2: 96%    Filed Weights   05/25/23 1257  Weight: 240 lb 11.2 oz (109.2 kg)     GENERAL: well appearing male in NAD  SKIN: skin color, texture, turgor are normal, no rashes or significant lesions EYES: conjunctiva are pink and non-injected, sclera clear LUNGS: clear to auscultation and percussion with normal breathing effort HEART: regular rate & rhythm and no murmurs and no lower extremity edema Musculoskeletal: no cyanosis of digits and no clubbing  PSYCH: alert & oriented x 3, fluent speech NEURO: no focal motor/sensory deficits  LABORATORY DATA:  I have reviewed the data as listed    Latest Ref Rng & Units 05/25/2023   12:29 PM 03/23/2023   10:41 AM 11/24/2022     1:35 PM  CBC  WBC 4.0 - 10.5 K/uL 5.7  5.0  5.0   Hemoglobin 13.0 - 17.0 g/dL 95.2  84.1  32.4   Hematocrit 39.0 - 52.0 % 38.2  44.0  41.5   Platelets 150 - 400 K/uL 262  289  243        Latest Ref Rng & Units 03/23/2023   10:41 AM 02/11/2023    9:00 AM 02/01/2023   10:33 AM  CMP  Glucose 70 - 99 mg/dL 401  027  253   BUN 8 - 27 mg/dL 25  23  20    Creatinine 0.76 - 1.27 mg/dL 6.64  4.03  4.74   Sodium 134 - 144 mmol/L 140  139  136   Potassium 3.5 - 5.2 mmol/L 4.8  4.3  4.5   Chloride 96 - 106 mmol/L 99  103  102   CO2 20 - 29 mmol/L 22  26  23    Calcium 8.6 - 10.2 mg/dL 25.9  9.9  9.7    ASSESSMENT & PLAN Maxwell Harris is a 68 y.o. male who presents to for a follow up for iron deficiency anemia.    # Iron deficiency anemia: --Underwent EGD and colonoscopy on 07/28/2022 with Dr Tomasa Rand. Colonoscopy showed one polyp in distal sigmoid colon, removed and mild diverticulosis in descending colon without bleeding. EGD was unremarkable.  --Patient denies any signs of bleeding.  --Last received IV feraheme 510 mg x 1 dose on 12/01/2022 --Unable to tolerate p.o. iron supplementation due to constipation. --Labs today show no evidence of anemia with Hgb 13.1. Iron panel shows mild deficiency saturation 16%, TIBC 532, ferritin 53 --Since iron levels are borderline low, recommend IV feraheme 510 mg x 1 dose.  --RTC in 3 months for labs and 6  months with labs/follow up  No orders of the defined types were placed in this encounter.   All questions were answered. The patient knows to call the clinic with any problems, questions or concerns.  I have spent a total of 30 minutes minutes of face-to-face and non-face-to-face time, preparing to see the patient,  performing a medically appropriate examination, counseling and educating the patient, ordering medications/tests/procedures, documenting clinical information in the electronic health record,  and care coordination.   Georga Kaufmann,  PA-C Department of Hematology/Oncology [facility] Phone: [phone]

## 2023-05-26 ENCOUNTER — Encounter: Payer: Self-pay | Admitting: Physician Assistant

## 2023-05-27 ENCOUNTER — Ambulatory Visit: Payer: No Typology Code available for payment source | Attending: Cardiology

## 2023-05-27 DIAGNOSIS — I255 Ischemic cardiomyopathy: Secondary | ICD-10-CM

## 2023-05-27 LAB — CUP PACEART REMOTE DEVICE CHECK
Battery Remaining Longevity: 138 mo
Battery Voltage: 3.15 V
Brady Statistic AP VP Percent: 13.81 %
Brady Statistic AP VS Percent: 0.27 %
Brady Statistic AS VP Percent: 84.26 %
Brady Statistic AS VS Percent: 1.66 %
Brady Statistic RA Percent Paced: 14.23 %
Brady Statistic RV Percent Paced: 4.31 %
Date Time Interrogation Session: 20250403010854
HighPow Impedance: 59 Ohm
Implantable Lead Connection Status: 753985
Implantable Lead Connection Status: 753985
Implantable Lead Connection Status: 753985
Implantable Lead Implant Date: 20250218
Implantable Lead Implant Date: 20250218
Implantable Lead Implant Date: 20250218
Implantable Lead Location: 753858
Implantable Lead Location: 753859
Implantable Lead Location: 753860
Implantable Lead Model: 4598
Implantable Lead Model: 5076
Implantable Pulse Generator Implant Date: 20250218
Lead Channel Impedance Value: 342 Ohm
Lead Channel Impedance Value: 361 Ohm
Lead Channel Impedance Value: 399 Ohm
Lead Channel Impedance Value: 399 Ohm
Lead Channel Impedance Value: 418 Ohm
Lead Channel Impedance Value: 437 Ohm
Lead Channel Impedance Value: 475 Ohm
Lead Channel Impedance Value: 627 Ohm
Lead Channel Impedance Value: 703 Ohm
Lead Channel Impedance Value: 722 Ohm
Lead Channel Impedance Value: 741 Ohm
Lead Channel Impedance Value: 779 Ohm
Lead Channel Impedance Value: 779 Ohm
Lead Channel Pacing Threshold Amplitude: 0.375 V
Lead Channel Pacing Threshold Amplitude: 0.625 V
Lead Channel Pacing Threshold Amplitude: 0.75 V
Lead Channel Pacing Threshold Pulse Width: 0.4 ms
Lead Channel Pacing Threshold Pulse Width: 0.4 ms
Lead Channel Pacing Threshold Pulse Width: 0.4 ms
Lead Channel Sensing Intrinsic Amplitude: 3.1 mV
Lead Channel Sensing Intrinsic Amplitude: 9.8 mV
Lead Channel Setting Pacing Amplitude: 1.25 V
Lead Channel Setting Pacing Amplitude: 1.75 V
Lead Channel Setting Pacing Amplitude: 1.75 V
Lead Channel Setting Pacing Pulse Width: 0.4 ms
Lead Channel Setting Pacing Pulse Width: 0.4 ms
Lead Channel Setting Sensing Sensitivity: 0.3 mV
Zone Setting Status: 755011
Zone Setting Status: 755011
Zone Setting Status: 755011

## 2023-05-31 ENCOUNTER — Ambulatory Visit
Admission: EM | Admit: 2023-05-31 | Discharge: 2023-05-31 | Disposition: A | Discharge status: Home / Self Care | Attending: Physician Assistant | Admitting: Physician Assistant

## 2023-05-31 ENCOUNTER — Telehealth: Payer: Self-pay | Admitting: Cardiology

## 2023-05-31 ENCOUNTER — Emergency Department (HOSPITAL_COMMUNITY)

## 2023-05-31 ENCOUNTER — Encounter: Payer: Self-pay | Admitting: Emergency Medicine

## 2023-05-31 ENCOUNTER — Emergency Department (HOSPITAL_COMMUNITY)
Admission: EM | Admit: 2023-05-31 | Discharge: 2023-06-01 | Discharge status: Against Medical Advice | Attending: Emergency Medicine | Admitting: Emergency Medicine

## 2023-05-31 DIAGNOSIS — M7989 Other specified soft tissue disorders: Secondary | ICD-10-CM

## 2023-05-31 DIAGNOSIS — Z5321 Procedure and treatment not carried out due to patient leaving prior to being seen by health care provider: Secondary | ICD-10-CM | POA: Insufficient documentation

## 2023-05-31 DIAGNOSIS — R0609 Other forms of dyspnea: Secondary | ICD-10-CM | POA: Diagnosis not present

## 2023-05-31 DIAGNOSIS — I82622 Acute embolism and thrombosis of deep veins of left upper extremity: Secondary | ICD-10-CM

## 2023-05-31 DIAGNOSIS — R2232 Localized swelling, mass and lump, left upper limb: Secondary | ICD-10-CM | POA: Insufficient documentation

## 2023-05-31 DIAGNOSIS — R0789 Other chest pain: Secondary | ICD-10-CM | POA: Diagnosis not present

## 2023-05-31 LAB — BASIC METABOLIC PANEL WITH GFR
Anion gap: 11 (ref 5–15)
BUN: 20 mg/dL (ref 8–23)
CO2: 27 mmol/L (ref 22–32)
Calcium: 9.8 mg/dL (ref 8.9–10.3)
Chloride: 99 mmol/L (ref 98–111)
Creatinine, Ser: 1.38 mg/dL — ABNORMAL HIGH (ref 0.61–1.24)
GFR, Estimated: 55 mL/min — ABNORMAL LOW (ref >60)
Glucose, Bld: 120 mg/dL — ABNORMAL HIGH (ref 70–99)
Potassium: 3.9 mmol/L (ref 3.5–5.1)
Sodium: 137 mmol/L (ref 135–145)

## 2023-05-31 LAB — CBC
HCT: 35.9 % — ABNORMAL LOW (ref 39.0–52.0)
Hemoglobin: 12 g/dL — ABNORMAL LOW (ref 13.0–17.0)
MCH: 28 pg (ref 26.0–34.0)
MCHC: 33.4 g/dL (ref 30.0–36.0)
MCV: 83.9 fL (ref 80.0–100.0)
Platelets: 265 10*3/uL (ref 150–400)
RBC: 4.28 MIL/uL (ref 4.22–5.81)
RDW: 14 % (ref 11.5–15.5)
WBC: 7.1 10*3/uL (ref 4.0–10.5)
nRBC: 0.4 % — ABNORMAL HIGH (ref 0.0–0.2)

## 2023-05-31 LAB — TROPONIN I (HIGH SENSITIVITY)
Troponin I (High Sensitivity): 8 ng/L (ref <18)
Troponin I (High Sensitivity): 10 ng/L (ref <18)

## 2023-05-31 NOTE — ED Triage Notes (Signed)
 Pt recently diagnosed with LUE DVT, anticoagulated on Eliquis. Pt states that he's had worsening swelling to the area. Pt endorses DOE and "heartburn" that started yesterday.

## 2023-05-31 NOTE — ED Notes (Signed)
 Patient is being discharged from the Urgent Care and sent to the Emergency Department via Private Vehicle (Self) . Per Provider, patient is in need of higher level of care due to more advanced care/testing needed. Patient is aware and verbalizes understanding of plan of care.  Vitals:   05/31/23 1403  BP: 121/66  Pulse: 69  Resp: 18  Temp: 98.3 F (36.8 C)  SpO2: 96%

## 2023-05-31 NOTE — ED Notes (Signed)
 Pt leaving. Does not want to wait any longer. Will speak with his primary tomorrow about results.

## 2023-05-31 NOTE — ED Provider Triage Note (Signed)
 Emergency Medicine Provider Triage Evaluation Note  Maxwell Harris , a 68 y.o. male  was evaluated in triage.  Pt complains of left arm swelling pain and "heartburn" following taking first bite of food last night. Some SHOB with exertion. Hx of ICD  Takes Elqiuis and has been complaint with this.  Review of Systems  Positive: CP, SHOB Negative: fevers  Physical Exam  BP 131/80 (BP Location: Right Arm)   Pulse 77   Temp 98.3 F (36.8 C) (Oral)   Resp 16   SpO2 100%  Gen:   Awake, no distress   Resp:  Normal effort  MSK:   Moves extremities without difficulty  Other:  Mildly TTP and swelling to LUE  Medical Decision Making  Medically screening exam initiated at 4:11 PM.  Appropriate orders placed.  Alpha Gula was informed that the remainder of the evaluation will be completed by another provider, this initial triage assessment does not replace that evaluation, and the importance of remaining in the ED until their evaluation is complete.  Labs ordered   Judithann Sheen, [place] 05/31/23 1615

## 2023-05-31 NOTE — Telephone Encounter (Signed)
  1. Has your device fired? No   2. Is you device beeping? No   3. Are you experiencing draining or swelling at device site? Yes swelling   4. Are you calling to see if we received your device transmission? No   5. Have you passed out? no   Please route to Device Clinic Pool

## 2023-05-31 NOTE — ED Provider Notes (Signed)
 [facility]    CSN: [id number] Arrival date & time: 05/31/23  1343      History   Chief Complaint Chief Complaint  Patient presents with   Arm Swelling   new dyspnea on exertion   Heartburn    HPI Maxwell Harris is a 68 y.o. male.   Presents today to urgent care for evaluation of continued left arm swelling, shortness of breath on exertion, specifically with walking upstairs and new heartburn.  He does have significant past medical history including recent DVT in his left arm.  He states symptoms did improve when he was taking medication but he ran out of medication and swelling returned.    The history is provided by the patient.  Heartburn Associated symptoms include shortness of breath.    Past Medical History:  Diagnosis Date   Chronic GERD    Hypertension    Pain    Pre-diabetes     Patient Active Problem List   Diagnosis Date Noted   Acute systolic CHF (congestive heart failure) (HCC) 10/23/2022   Dyspnea 10/20/2022   Essential hypertension 10/20/2022   Acute CHF (HCC) 10/20/2022   Polysubstance (excluding opioids) dependence (HCC) 10/20/2022   Iron deficiency anemia 03/27/2022    Past Surgical History:  Procedure Laterality Date   BIV ICD INSERTION CRT-D N/A 04/13/2023   Procedure: BIV ICD INSERTION CRT-D;  Surgeon: Regan Lemming, MD;  Location: [facility] INVASIVE CV LAB;  Service: Cardiovascular;  Laterality: N/A;   COLONOSCOPY     ESOPHAGOGASTRODUODENOSCOPY     RIGHT/LEFT HEART CATH AND CORONARY ANGIOGRAPHY N/A 10/22/2022   Procedure: RIGHT/LEFT HEART CATH AND CORONARY ANGIOGRAPHY;  Surgeon: Laurey Morale, MD;  Location: [facility] INVASIVE CV LAB;  Service: Cardiovascular;  Laterality: N/A;       Home Medications    Prior to Admission medications   Medication Sig Start Date End Date Taking? Authorizing Provider  acetaminophen (TYLENOL) 500 MG tablet Take 500-1,000 mg by mouth every 6 (six) hours as needed for moderate pain (pain score  4-6). joint   Yes [provider]  apixaban (ELIQUIS) 5 MG TABS tablet Take 1 tablet (5 mg total) by mouth 2 (two) times daily. Start taking after completion of starter pack. 05/03/23  Yes Yates, Madison B, RPH-CPP  carvedilol (COREG) 6.25 MG tablet TAKE 1 TABLET BY MOUTH 2 TIMES DAILY WITH A MEAL. 02/16/23  Yes Laurey Morale, MD  Cholecalciferol (VITAMIN D) 50 MCG (2000 UT) tablet Take 2,000 Units by mouth daily.   Yes [provider]  cycloSPORINE (RESTASIS) 0.05 % ophthalmic emulsion Place 1 drop into both eyes daily. 05/15/22  Yes [provider]  DULoxetine (CYMBALTA) 20 MG capsule Take 40 mg by mouth daily.   Yes [provider]  gabapentin (NEURONTIN) 300 MG capsule Take 300 mg by mouth 2 (two) times daily.   Yes [provider]  lidocaine (LIDODERM) 5 % Place 1 patch onto the skin daily as needed (pain). 11/06/22  Yes [provider]  losartan (COZAAR) 25 MG tablet Take 1 tablet (25 mg total) by mouth 2 (two) times daily. Patient taking differently: Take 25 mg by mouth daily. 02/01/23  Yes Laurey Morale, MD  omeprazole (PRILOSEC) 20 MG capsule Take 20 mg by mouth daily.   Yes [provider]  potassium chloride SA (KLOR-CON M) 20 MEQ tablet Take 1 tablet (20 mEq total) by mouth daily. 11/03/22 04/04/24 Yes Milford, Anderson Malta, FNP  rosuvastatin (CRESTOR) 10 MG tablet  Take 1 tablet (10 mg total) by mouth daily. 02/26/23 05/31/23 Yes Laurey Morale, MD  sildenafil (VIAGRA) 100 MG tablet Take 100 mg by mouth as needed for erectile dysfunction. 09/15/22  Yes [provider]  spironolactone (ALDACTONE) 25 MG tablet Take 1 tablet (25 mg total) by mouth daily. 11/04/22 04/04/24 Yes Milford, Anderson Malta, FNP  torsemide (DEMADEX) 20 MG tablet Take 1 tablet (20 mg total) by mouth daily. 02/26/23  Yes Laurey Morale, MD  APIXABAN Everlene Balls) VTE STARTER PACK (10MG  AND 5MG ) Take as directed on package: start with two-5mg  tablets twice daily  for 7 days. On day 8, switch to one-5mg  tablet twice daily. Patient not taking: Reported on 05/31/2023 04/24/23   Glyn Ade, MD  diclofenac Sodium (VOLTAREN) 1 % GEL Apply 2 g topically 4 (four) times daily as needed (pain). Patient not taking: Reported on 05/31/2023 03/03/22   [provider]  empagliflozin (JARDIANCE) 25 MG TABS tablet Take 1/2 tablet daily Patient not taking: Reported on 05/31/2023 11/04/22   Jacklynn Ganong, FNP    Family History Family History  Problem Relation Age of Onset   Stroke Mother    Other Father        patient was a kid when his Dad died, not sure of the cause   Colon cancer Neg Hx    Esophageal cancer Neg Hx     Social History Social History   Tobacco Use   Smoking status: Former    Types: Cigarettes   Smokeless tobacco: Never   Tobacco comments:    Not able to remember when he quit  Vaping Use   Vaping status: Never Used  Substance Use Topics   Alcohol use: Yes    Alcohol/week: 1.0 standard drink of alcohol    Types: 1 Cans of beer per week    Comment: social   Drug use: Not Currently    Frequency: 1.0 times per week    Types: Marijuana     Allergies   Lisinopril, Shellfish allergy, and Shellfish-derived products   Review of Systems Review of Systems  Constitutional:  Negative for chills and fever.  Eyes:  Negative for discharge and redness.  Respiratory:  Positive for shortness of breath.   Gastrointestinal:  Positive for heartburn.  Musculoskeletal:  Positive for myalgias.     Physical Exam Triage Vital Signs ED Triage Vitals  Encounter Vitals Group     BP 05/31/23 1403 121/66     Systolic BP Percentile --      Diastolic BP Percentile --      Pulse Rate 05/31/23 1403 69     Resp 05/31/23 1403 18     Temp 05/31/23 1403 98.3 F (36.8 C)     Temp Source 05/31/23 1403 Oral     SpO2 05/31/23 1403 96 %     Weight --      Height --      Head Circumference --      Peak Flow --      Pain Score 05/31/23 1404 0      Pain Loc --      Pain Education --      Exclude from Growth Chart --    No data found.  Updated Vital Signs BP 121/66 (BP Location: Left Arm)   Pulse 69   Temp 98.3 F (36.8 C) (Oral)   Resp 18   SpO2 96%   Visual Acuity Right Eye Distance:   Left Eye Distance:   Bilateral  Distance:    Right Eye Near:   Left Eye Near:    Bilateral Near:     Physical Exam Vitals and nursing note reviewed.  Constitutional:      General: He is not in acute distress.    Appearance: Normal appearance. He is not ill-appearing.  HENT:     Head: Normocephalic and atraumatic.  Eyes:     Conjunctiva/sclera: Conjunctivae normal.  Cardiovascular:     Rate and Rhythm: Normal rate.  Pulmonary:     Effort: Pulmonary effort is normal. No respiratory distress.  Musculoskeletal:     Comments: Swelling appreciated to lower left arm  Neurological:     Mental Status: He is alert.      UC Treatments / Results  Labs (all labs ordered are listed, but only abnormal results are displayed) Labs Reviewed - No data to display  EKG   Radiology No results found.  Procedures Procedures (including critical care time)  Medications Ordered in UC Medications - No data to display  Initial Impression / Assessment and Plan / UC Course  I have reviewed the triage vital signs and the nursing notes.  Pertinent labs & imaging results that were available during my care of the patient were reviewed by me and considered in my medical decision making (see chart for details).    EKG without changes compared to prior. Given recent history recommended further evaluation in ED. Patient is agreeable with plan.  Final Clinical Impressions(s) / UC Diagnoses   Final diagnoses:  Chest discomfort  Left arm swelling  Acute deep vein thrombosis (DVT) of left upper extremity, unspecified vein (HCC)   Discharge Instructions   None    ED Prescriptions   None    PDMP not reviewed this encounter.   Tomi Bamberger, PA-C 05/31/23 1504

## 2023-05-31 NOTE — Telephone Encounter (Signed)
 Patient reports arm swelling had decreased and since started to increase again since completing medications. Patient also repots of burning in chest x1 day/pain in shoulder blade and shortness of breath while walking up stairs. Patient advised to go to ER, he declined and stated he would go to urgent care. Patient is agreeable. Remote transmission reviewed and shows normal device function. No alerts.   Routing to triage to advise about patients symptoms today.

## 2023-05-31 NOTE — ED Triage Notes (Addendum)
 Pt had BIV ICD placed on 04/13/23 and reports several days after he started having these new symptoms: L arm swelling and dyspnea on exertion. Pt is also concerned about heartburn that started yesterday. Reports he has followed up several places including the ED, but just looking for answers at this point. Denies dizziness and lightheadedness over this same time period. Also started on eliquis at the same time as new medication. Pt reports he had them do a monitor check of ICD this morning and everything was normal.

## 2023-05-31 NOTE — Telephone Encounter (Signed)
 I called and spoke with the pt... he is at the Urgent Care... I reinforced that he stays there for there for a full assessment or still consider the ED based on his worrisome symptoms and his recent DVT.   Pt agrees and will follow up with [place] thereafter.

## 2023-06-01 ENCOUNTER — Telehealth: Payer: Self-pay | Admitting: Pharmacy Technician

## 2023-06-01 NOTE — Telephone Encounter (Addendum)
 Delores Fester,  Patient will need Community of Care referral request completed.Patient can not be seen or treated until the request is approved.   Please sign the form (line 56) and return to me. Once signed I will complete the remainder and submit to the [place]. The forms has been scanned to the media tab under Community of Care request.  Auth Submission: PENDING Site of care: Site of care: [facility] Payer: [facility] Medication & CPT/J Code(s) submitted: Feraheme  (ferumoxytol ) [phone] Route of submission (phone, fax, portal):  Phone #[phone] - [place] Fax # Auth type: Buy/Bill PB Units/visits requested: 2 doses Reference number:  Approval from:  to    Phone: [phone] - ext: [phone] Fax: [phone]  F/u: 06/16/22. Called for update and forms are still pending.  06/17/23: called for another update and was told the forms has finally been submitted to a Physician for review.  Unfortunately they could not give me an estimated time-frame.

## 2023-06-01 NOTE — Telephone Encounter (Signed)
 Pt calling back because he is not sure who should be following his recently dx DVT.  Pt informed that typically PCP is going to be the one who advises/follows up on this, but aware that I will send to Dr. Alford Highland office to see if they would prefer to be involved.  Aware heart failure clinic will let him know. In the meantime, I advised pt to call the [facility] to ensure someone is following up on this matter. Patient verbalized understanding and agreeable to plan.

## 2023-06-01 NOTE — Telephone Encounter (Signed)
 Called pt to inform him of heart failure clinic's response.  Aware I am not familiar with this clinic.  Aware I am forwarding the note to their clinic and ask them to follow up with patient and give him address and a way to contact them if he has questions/concerns.  Patient aware they will let him know.  He appreciates my efforts to help.

## 2023-06-02 NOTE — Telephone Encounter (Signed)
 Called to speak with patient. He presented to urgent care 05/31/23 reporting chest discomfort and pain in his left shoulder. He was also experiencing swelling in his hand. He was sent to the ED but left before being evaluated due to the wait time. He reports that the swelling in his hand has improved with elevation. He initially sounded like he stopped taking Eliquis but he meant that he completed the starter pack and is now taking the refills provided through the [place]. He was able to tell me how he is taking Eliquis which was correct. Denies missed doses. Denies chest pain or SOB today. Troponins were negative in the ED. He reports continued pain in his shoulder that he describes as being located in his deltoid muscle. Explained that this location would not be related to pain from a clot, sounds like it could be muscular pain. He reports he has started trying to return to activities recently which could be related. Encouraged patient to follow up with his PCP for the shoulder pain and follow instructions from the device clinic in terms of what activities he can return to. From the DVT Clinic standpoint, encouraged him to continue taking Eliquis and we will see him next in June. Happy to see him sooner if needed. He has my phone number to reach out with any other clot related questions.

## 2023-06-10 ENCOUNTER — Telehealth: Payer: Self-pay | Admitting: Pharmacy Technician

## 2023-06-10 NOTE — Telephone Encounter (Signed)
 Tereasa Felty,  You can fax the completed forms to fax# [phone].  Thanks Burdette Carolin

## 2023-06-23 NOTE — Telephone Encounter (Signed)
 Maxwell Harris, Unfortunately the [employer] of Care request has been denied.  [facility]. Denial letter has been scanned to the media tab for your review. We will d/c the treatment plan.

## 2023-07-07 NOTE — Progress Notes (Signed)
 Remote pacemaker transmission.

## 2023-07-09 ENCOUNTER — Other Ambulatory Visit (HOSPITAL_COMMUNITY): Payer: Self-pay | Admitting: Cardiology

## 2023-07-09 DIAGNOSIS — I5022 Chronic systolic (congestive) heart failure: Secondary | ICD-10-CM

## 2023-07-13 ENCOUNTER — Encounter: Payer: No Typology Code available for payment source | Admitting: Physician Assistant

## 2023-07-19 NOTE — Progress Notes (Unsigned)
 Cardiology Office Note:  .   Date:  07/19/2023  ID:  AMARDEEP BECKERS, DOB 1955/10/26, MRN [id number] PCP: Tatiana Farrier, MD  [facility] Providers Cardiologist:  None Electrophysiologist:  Will Cortland Ding, MD  Advanced Heart Failure:  Peder Bourdon, MD {  History of Present Illness: Maxwell Harris is a 68 y.o. male w/PMHx of  HTN, OSA (w/CPAP), GERD, IDA Hx of alcohol/THC/cocaine  Admitted 09/2022 with acute systolic heart failure and PNA. Echo showed EF <20%, RV ok, IVC dilated and estimated RAP 15.  UDS + cocaine and THC.  R/LHC showed no significant coronary disease. RHC with near normal filling pressures, and low CI at 2.  Unable to complete cMRI with claustrophobia. GDMT titrated.   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 Pt reported he would gp to an U[facility] "Normal device transmission" reported  05/31/23: [facility]CC visit reported he had run out of Eliquis  and when off swelling increased, recommended to pursue full ED w/u Did go to the ER, here they reported compliant with Eliquis  >> ultimately he left after a long wait  EP RN and RPH team f/u, he reported he was ON uninterrupted OAC > perhaps sounded more like a musculoskeletal complaint/shoulder pain  Today's visit is scheduled as his 90 day post CRT-D implant ROS:   His arm is better but note quite back to normal No pain  No CP, denies SOB but has noted he has gained a couple pounds, perhaps feels like he may be retaining some fluid No near syncope or syncope Slight discomfort at his device site, comes/goes. No shocks  Feels a little depressed post implant  No  bleeding or signs of bleeding  Device information MDT CRT-D implanted 04/13/23 (+CS lead)  Studies Reviewed: Aaron Aas    EKG done today and reviewed by myself:  05/31/23: EKG unchanged from post implant Post implant: 04/13/23: SR/V paced, no R waves V1-3, though, QRS from 162  DEVICE interrogation done today and reviewed by myself Battery and lead measurements are good No arrhythmias OptiVol is on the rise   Risk Assessment/Calculations:    Physical Exam:   VS:  There were no vitals taken for this visit.   Wt Readings from Last 3 Encounters:  05/25/23 240 lb 11.2 oz (109.2 kg)  04/13/23 230 lb (104.3 kg)  04/09/23 230 lb (104.3 kg)    GEN: Well nourished, well developed in no acute distress NECK: No JVD; No carotid bruits CARDIAC: RRR, no murmurs, rubs, gallops RESPIRATORY:  CTA b/l without rales, wheezing or rhonchi  ABDOMEN: Soft, non-tender, non-distended EXTREMITIES:  LUE does look slightly bigger then the left (he is R handed), No edema; No deformity   ICD site: is stable, no thinning, fluctuation, tethering, small keloid that is slightly tender  ASSESSMENT AND PLAN: .    CRT-D Complicated by post procedure DVT intact function no programming changes made  Post implant LUE DVT > sees [facility] 07/27/23 for DVT (clinic)   NICM Chronic CHF BP 98.2% EKG morphology post implant have been stable, looks good No exam findings of volume OL, though OptiVol is on the rise and  he reports hoome weight is up a few pounds Instructed hin to double his torsemide  to 40mg  daily for 3 days C/w Dr. Adam Holm   Dispo: remotes as usual, back in clinic with EP in 64mo, he is due to AHF team.   Signed, Debbie Fails, PA-C

## 2023-07-20 ENCOUNTER — Encounter: Payer: Self-pay | Admitting: Physician Assistant

## 2023-07-20 ENCOUNTER — Ambulatory Visit: Payer: No Typology Code available for payment source | Attending: Physician Assistant | Admitting: Physician Assistant

## 2023-07-20 VITALS — BP 150/80 | HR 57 | Ht 73.0 in | Wt 242.0 lb

## 2023-07-20 DIAGNOSIS — I428 Other cardiomyopathies: Secondary | ICD-10-CM | POA: Diagnosis not present

## 2023-07-20 DIAGNOSIS — Z95810 Presence of automatic (implantable) cardiac defibrillator: Secondary | ICD-10-CM | POA: Diagnosis not present

## 2023-07-20 DIAGNOSIS — I5022 Chronic systolic (congestive) heart failure: Secondary | ICD-10-CM

## 2023-07-20 LAB — CUP PACEART INCLINIC DEVICE CHECK
Date Time Interrogation Session: 20250527165012
Implantable Lead Connection Status: 753985
Implantable Lead Connection Status: 753985
Implantable Lead Connection Status: 753985
Implantable Lead Implant Date: 20250218
Implantable Lead Implant Date: 20250218
Implantable Lead Implant Date: 20250218
Implantable Lead Location: 753858
Implantable Lead Location: 753859
Implantable Lead Location: 753860
Implantable Lead Model: 4598
Implantable Lead Model: 5076
Implantable Pulse Generator Implant Date: 20250218

## 2023-07-20 NOTE — Patient Instructions (Signed)
 Medication Instructions:   Your physician recommends that you continue on your current medications as directed. Please refer to the Current Medication list given to you today.  *If you need a refill on your cardiac medications before your next appointment, please call your pharmacy*  Lab Work:  NONE ORDERED  TODAY   If you have labs (blood work) drawn today and your tests are completely normal, you will receive your results only by: MyChart Message (if you have MyChart) OR A paper copy in the mail If you have any lab test that is abnormal or we need to change your treatment, we will call you to review the results.  Testing/Procedures: NONE ORDERED  TODAY    Follow-Up: At [facility], you and your health needs are our priority.  As part of our continuing mission to provide you with exceptional heart care, our providers are all part of one team.  This team includes your primary Cardiologist (physician) and Advanced Practice Providers or APPs (Physician Assistants and Nurse Practitioners) who all work together to provide you with the care you need, when you need it.  Your next appointment:    6 month(s)   Provider:   You may see Will Cortland Ding, MD or one of the following Advanced Practice Providers on your designated Care Team:   Mertha Abrahams, [place]    We recommend signing up for the patient portal called "MyChart".  Sign up information is provided on this After Visit Summary.  MyChart is used to connect with patients for Virtual Visits (Telemedicine).  Patients are able to view lab/test results, encounter notes, upcoming appointments, etc.  Non-urgent messages can be sent to your provider as well.   To learn more about what you can do with MyChart, go to [email or web address].   Other Instructions

## 2023-07-21 ENCOUNTER — Ambulatory Visit: Payer: Self-pay | Admitting: Cardiology

## 2023-07-27 ENCOUNTER — Encounter: Payer: Self-pay | Admitting: Student-PharmD

## 2023-07-27 ENCOUNTER — Ambulatory Visit: Attending: Vascular Surgery | Admitting: Student-PharmD

## 2023-07-27 VITALS — Wt 246.1 lb

## 2023-07-27 DIAGNOSIS — I82A12 Acute embolism and thrombosis of left axillary vein: Secondary | ICD-10-CM

## 2023-07-27 NOTE — Patient Instructions (Signed)
 You have been discharged from the DVT Clinic! No further follow up in the DVT Clinic is needed.  -Continue taking Eliquis  5 mg (1 tablet) twice daily. After you finish out your current supply, that will complete 3 months of treatment for you and you can stop taking it.   Please reach out if any questions come up at [phone].

## 2023-07-27 NOTE — Progress Notes (Signed)
 DVT Clinic Note  Name: Maxwell Harris     MRN: [id number]     DOB: 1955/10/31     Sex: male  PCP: Tatiana Farrier, MD  Today's Visit: Visit Information: Discharge Visit  Referred to DVT Clinic by: Emergency Department - Dr. Urban Garden Referred to CPP by: Dr. Edgardo Goodwill Reason for referral:  Chief Complaint  Patient presents with   Med Management - DVT   HISTORY OF PRESENT ILLNESS: Maxwell Harris is a 68 y.o. male with PMH HTN, HF recently diagnosed 09/2022 followed by [facility], LBBB, OSA on CPAP, iron deficiency anemia, GERD, alcohol, THC, and cocaine use who presents for follow up medication management after diagnosis of left axillary and brachial DVT on 04/24/23 s/p ICD placement on 04/13/23. He was seen in the ED at that time and started on Eliquis . Last seen in DVT Clinic 05/03/23 at which time Eliquis  was continued with plans to treat for 3 months. Today, patient reports that the swelling and pain in his left arm has resolved. Denies abnormal bleeding or bruising. Denies missed doses of Eliquis . Reports he has about a week supply remaining in his third month.    Positive Thrombotic Risk Factors: Recent surgery (within 3 months), Older Age Bleeding Risk Factors: Age >65 years, Anticoagulant therapy  Negative Thrombotic Risk Factors: Previous VTE, Recent trauma (within 3 months), Recent admission to hospital with acute illness (within 3 months), Paralysis, paresis, or recent plaster cast immobilization of lower extremity, Central venous catheterization, Bed rest >72 hours within 3 months, Sedentary journey lasting >8 hours within 4 weeks, Pregnancy, Within 6 weeks postpartum, Recent cesarean section (within 3 months), Estrogen therapy, Testosterone therapy, Erythropoiesis-stimulating agent, Recent COVID diagnosis (within 3 months), Active cancer, Non-malignant, chronic inflammatory condition, Known thrombophilic condition, Smoking, Obesity  Rx Insurance Coverage:   [employer] Rx Affordability: Patient used one time free card to fill starter pack. No issues affording refills through [place].  Preferred Pharmacy: [facility]   Past Medical History:  Diagnosis Date   Chronic GERD    Hypertension    Pain    Pre-diabetes     Past Surgical History:  Procedure Laterality Date   BIV ICD INSERTION CRT-D N/A 04/13/2023   Procedure: BIV ICD INSERTION CRT-D;  Surgeon: Lei Pump, MD;  Location[facility] INVASIVE CV LAB;  Service: Cardiovascular;  Laterality: N/A;   COLONOSCOPY     ESOPHAGOGASTRODUODENOSCOPY     RIGHT/LEFT HEART CATH AND CORONARY ANGIOGRAPHY N/A 10/22/2022   Procedure: RIGHT/LEFT HEART CATH AND CORONARY ANGIOGRAPHY;  Surgeon: Darlis Eisenmenger, MD;  Location: [facility] INVASIVE CV LAB;  Service: Cardiovascular;  Laterality: N/A;    Social History   Socioeconomic History   Marital status: Single    Spouse name: Not on file   Number of children: 1   Years of education: Not on file   Highest education level: Not on file  Occupational History   Occupation: retired  Tobacco Use   Smoking status: Former    Types: Cigarettes   Smokeless tobacco: Never   Tobacco comments:    Not able to remember when he quit  Vaping Use   Vaping status: Never Used  Substance and Sexual Activity   Alcohol use: Yes    Alcohol/week: 1.0 standard drink of alcohol    Types: 1 Cans of beer per week    Comment: social   Drug use: Not Currently    Frequency: 1.0 times per week    Types:  Marijuana   Sexual activity: Not Currently  Other Topics Concern   Not on file  Social History Narrative   Not on file   Social Drivers of Health   Financial Resource Strain: Not on file  Food Insecurity: No Food Insecurity (10/20/2022)   Hunger Vital Sign    Worried About Running Out of Food in the Last Year: Never true    Ran Out of Food in the Last Year: Never true  Transportation Needs: No Transportation Needs (10/20/2022)   PRAPARE -  Administrator, Civil Service (Medical): No    Lack of Transportation (Non-Medical): No  Physical Activity: Not on file  Stress: Not on file  Social Connections: Unknown (07/03/2021)   [facility]   Social Network    Social Network: Not on file  Intimate Partner Violence: Not At Risk (10/20/2022)   Humiliation, Afraid, Rape, and Kick questionnaire    Fear of Current or Ex-Partner: No    Emotionally Abused: No    Physically Abused: No    Sexually Abused: No    Family History  Problem Relation Age of Onset   Stroke Mother    Other Father        patient was a kid when his Dad died, not sure of the cause   Colon cancer Neg Hx    Esophageal cancer Neg Hx     Allergies as of 07/27/2023 - Review Complete 07/27/2023  Allergen Reaction Noted   Lisinopril Swelling 06/12/2020   Shellfish allergy Hives 07/24/2011   Shellfish-derived products Other (See Comments) 05/31/2023    Current Outpatient Medications on File Prior to Visit  Medication Sig Dispense Refill   apixaban  (ELIQUIS ) 5 MG TABS tablet Take 1 tablet (5 mg total) by mouth 2 (two) times daily. Start taking after completion of starter pack. 60 tablet 1   acetaminophen  (TYLENOL ) 500 MG tablet Take 500-1,000 mg by mouth every 6 (six) hours as needed for moderate pain (pain score 4-6). joint     carvedilol  (COREG ) 6.25 MG tablet TAKE 1 TABLET BY MOUTH 2 TIMES DAILY WITH A MEAL. 180 tablet 1   Cholecalciferol (VITAMIN D) 50 MCG (2000 UT) tablet Take 2,000 Units by mouth daily.     cycloSPORINE (RESTASIS) 0.05 % ophthalmic emulsion Place 1 drop into both eyes daily.     diclofenac Sodium (VOLTAREN) 1 % GEL Apply 2 g topically 4 (four) times daily as needed (pain).     DULoxetine  (CYMBALTA ) 20 MG capsule Take 40 mg by mouth daily.     gabapentin  (NEURONTIN ) 300 MG capsule Take 300 mg by mouth 2 (two) times daily.     lidocaine  (LIDODERM ) 5 % Place 1 patch onto the skin daily as needed (pain).      losartan  (COZAAR ) 25 MG tablet TAKE 1 TABLET BY MOUTH TWICE A DAY 180 tablet 3   omeprazole (PRILOSEC) 20 MG capsule Take 20 mg by mouth daily.     potassium chloride  SA (KLOR-CON  M) 20 MEQ tablet Take 1 tablet (20 mEq total) by mouth daily. 90 tablet 3   rosuvastatin  (CRESTOR ) 10 MG tablet Take 1 tablet (10 mg total) by mouth daily. 90 tablet 3   sildenafil (VIAGRA) 100 MG tablet Take 100 mg by mouth as needed for erectile dysfunction.     spironolactone  (ALDACTONE ) 25 MG tablet Take 1 tablet (25 mg total) by mouth daily. 90 tablet 3   torsemide  (DEMADEX ) 20 MG tablet Take 1 tablet (20  mg total) by mouth daily. 90 tablet 3   No current facility-administered medications on file prior to visit.   REVIEW OF SYSTEMS:  Review of Systems  Respiratory:  Negative for shortness of breath.   Cardiovascular:  Negative for chest pain and palpitations.  Musculoskeletal:  Negative for myalgias.  Neurological:  Negative for dizziness and tingling.   PHYSICAL EXAMINATION:  Vitals:   07/27/23 0945  Weight: 246 lb 1.6 oz (111.6 kg)    Body mass index is 32.47 kg/m.  Physical Exam Musculoskeletal:        General: No swelling or tenderness.  Skin:    Findings: No bruising or erythema.   LABS:  CBC     Component Value Date/Time   WBC 7.1 05/31/2023 1600   RBC 4.28 05/31/2023 1600   HGB 12.0 (L) 05/31/2023 1600   HGB 13.1 05/25/2023 1229   HGB 14.2 03/23/2023 1041   HCT 35.9 (L) 05/31/2023 1600   HCT 44.0 03/23/2023 1041   PLT 265 05/31/2023 1600   PLT 262 05/25/2023 1229   PLT 289 03/23/2023 1041   MCV 83.9 05/31/2023 1600   MCV 87 03/23/2023 1041   MCH 28.0 05/31/2023 1600   MCHC 33.4 05/31/2023 1600   RDW 14.0 05/31/2023 1600   RDW 14.5 03/23/2023 1041   LYMPHSABS 1.0 05/25/2023 1229   MONOABS 0.5 05/25/2023 1229   EOSABS 0.2 05/25/2023 1229   BASOSABS 0.0 05/25/2023 1229    Hepatic Function      Component Value Date/Time   PROT 7.2 10/20/2022 1222   ALBUMIN 3.7  10/20/2022 1222   AST 55 (H) 10/20/2022 1222   AST 27 08/25/2022 1331   ALT 69 (H) 10/20/2022 1222   ALT 30 08/25/2022 1331   ALKPHOS 75 10/20/2022 1222   BILITOT 0.3 10/20/2022 1222   BILITOT 0.5 08/25/2022 1331    Renal Function   Lab Results  Component Value Date   CREATININE 1.38 (H) 05/31/2023   CREATININE 1.55 (H) 03/23/2023   CREATININE 1.39 (H) 02/11/2023    CrCl cannot be calculated (Patient's most recent lab result is older than the maximum 21 days allowed.).   [facility] Vascular Lab Studies:  04/24/23 VAS US  UPPER EXTREMITY VENOUS DUPLEX LEFT  Summary:  Right:  No evidence of thrombosis in the subclavian.   Left:  No evidence of superficial vein thrombosis in the upper extremity.  Findings consistent with acute deep vein thrombosis involving the left axillary  vein and left brachial veins.   ASSESSMENT: Location of DVT: Left upper extremity Cause of DVT: provoked by a transient risk factor  Patient without prior history of DVT diagnosed with acute DVT in his left axillary and brachial veins 04/24/23 shortly after ICD placement on 04/13/23 and was started on Eliquis  at that time. Last seen in DVT Clinic in March and Dr. Fulton Job and I felt that his DVT was provoked by the recent surgery for ICD insertion and we planned to anticoagulate for a total of 3 months, which he is now nearing the end of. Symptoms have resolved. He reports adherent to Eliquis  but does have about a week remaining on hand. Will have him complete this supply which will finish 3 months of treatment then discontinue Eliquis . No need for repeat imaging. Patient agreeable with this plan and has no questions or concerns at this time.   PLAN: -Patient is discharged from the DVT Clinic. -Discontinue anticoagulation with Eliquis  after finishing out last week of supply on hand, as patient has  completed 3 months of treatment for provoked DVT.  -Counseled patient on future VTE risk reduction strategies and to inform all  future providers of DVT history.  Follow up: No further follow up needed in DVT Clinic at this time. Patient can reach out as needed.   Faye Hoops, PharmD, [place], CPP Deep Vein Thrombosis Clinic Clinical Pharmacist Practitioner [phone]

## 2023-08-24 ENCOUNTER — Inpatient Hospital Stay: Attending: Physician Assistant

## 2023-08-26 ENCOUNTER — Ambulatory Visit (INDEPENDENT_AMBULATORY_CARE_PROVIDER_SITE_OTHER): Payer: No Typology Code available for payment source

## 2023-08-26 DIAGNOSIS — I255 Ischemic cardiomyopathy: Secondary | ICD-10-CM

## 2023-08-26 LAB — CUP PACEART REMOTE DEVICE CHECK
Battery Remaining Longevity: 132 mo
Battery Voltage: 3.08 V
Brady Statistic AP VP Percent: 14.67 %
Brady Statistic AP VS Percent: 0.28 %
Brady Statistic AS VP Percent: 83.76 %
Brady Statistic AS VS Percent: 1.29 %
Brady Statistic RA Percent Paced: 14.95 %
Brady Statistic RV Percent Paced: 7.06 %
Date Time Interrogation Session: 20250703054740
HighPow Impedance: 66 Ohm
Implantable Lead Connection Status: 753985
Implantable Lead Connection Status: 753985
Implantable Lead Connection Status: 753985
Implantable Lead Implant Date: 20250218
Implantable Lead Implant Date: 20250218
Implantable Lead Implant Date: 20250218
Implantable Lead Location: 753858
Implantable Lead Location: 753859
Implantable Lead Location: 753860
Implantable Lead Model: 4598
Implantable Lead Model: 5076
Implantable Pulse Generator Implant Date: 20250218
Lead Channel Impedance Value: 323 Ohm
Lead Channel Impedance Value: 323 Ohm
Lead Channel Impedance Value: 380 Ohm
Lead Channel Impedance Value: 380 Ohm
Lead Channel Impedance Value: 399 Ohm
Lead Channel Impedance Value: 399 Ohm
Lead Channel Impedance Value: 399 Ohm
Lead Channel Impedance Value: 532 Ohm
Lead Channel Impedance Value: 627 Ohm
Lead Channel Impedance Value: 646 Ohm
Lead Channel Impedance Value: 646 Ohm
Lead Channel Impedance Value: 665 Ohm
Lead Channel Impedance Value: 703 Ohm
Lead Channel Pacing Threshold Amplitude: 0.375 V
Lead Channel Pacing Threshold Amplitude: 0.625 V
Lead Channel Pacing Threshold Amplitude: 0.875 V
Lead Channel Pacing Threshold Pulse Width: 0.4 ms
Lead Channel Pacing Threshold Pulse Width: 0.4 ms
Lead Channel Pacing Threshold Pulse Width: 0.4 ms
Lead Channel Sensing Intrinsic Amplitude: 2.6 mV
Lead Channel Sensing Intrinsic Amplitude: 9.8 mV
Lead Channel Setting Pacing Amplitude: 1.5 V
Lead Channel Setting Pacing Amplitude: 1.5 V
Lead Channel Setting Pacing Amplitude: 1.5 V
Lead Channel Setting Pacing Pulse Width: 0.4 ms
Lead Channel Setting Pacing Pulse Width: 0.4 ms
Lead Channel Setting Sensing Sensitivity: 0.3 mV
Zone Setting Status: 755011
Zone Setting Status: 755011
Zone Setting Status: 755011

## 2023-08-30 ENCOUNTER — Ambulatory Visit: Payer: Self-pay | Admitting: Cardiology

## 2023-11-15 ENCOUNTER — Inpatient Hospital Stay (HOSPITAL_COMMUNITY): Admission: RE | Admit: 2023-11-15 | Source: Ambulatory Visit

## 2023-11-22 ENCOUNTER — Telehealth (HOSPITAL_COMMUNITY): Payer: Self-pay

## 2023-11-22 NOTE — Telephone Encounter (Signed)
 Called to confirm/remind patient of their appointment at the Advanced Heart Failure Clinic on 11/23/23 10:30.   Appointment:   [x] Confirmed  [] Left mess   [] No answer/No voice mail  [] VM Full/unable to leave message  [] Phone not in service  Patient reminded to bring all medications and/or complete list.  Confirmed patient has transportation. Gave directions, instructed to utilize valet parking.

## 2023-11-23 ENCOUNTER — Ambulatory Visit (HOSPITAL_COMMUNITY): Payer: Self-pay | Admitting: Cardiology

## 2023-11-23 ENCOUNTER — Encounter (HOSPITAL_COMMUNITY): Payer: Self-pay

## 2023-11-23 ENCOUNTER — Ambulatory Visit (HOSPITAL_COMMUNITY)
Admission: RE | Admit: 2023-11-23 | Discharge: 2023-11-23 | Disposition: A | Discharge status: Home / Self Care | Source: Ambulatory Visit | Attending: Cardiology | Admitting: Cardiology

## 2023-11-23 DIAGNOSIS — F1290 Cannabis use, unspecified, uncomplicated: Secondary | ICD-10-CM | POA: Insufficient documentation

## 2023-11-23 DIAGNOSIS — K219 Gastro-esophageal reflux disease without esophagitis: Secondary | ICD-10-CM | POA: Diagnosis not present

## 2023-11-23 DIAGNOSIS — E785 Hyperlipidemia, unspecified: Secondary | ICD-10-CM | POA: Diagnosis not present

## 2023-11-23 DIAGNOSIS — Z87891 Personal history of nicotine dependence: Secondary | ICD-10-CM | POA: Diagnosis not present

## 2023-11-23 DIAGNOSIS — I5022 Chronic systolic (congestive) heart failure: Secondary | ICD-10-CM | POA: Diagnosis not present

## 2023-11-23 DIAGNOSIS — D509 Iron deficiency anemia, unspecified: Secondary | ICD-10-CM | POA: Diagnosis not present

## 2023-11-23 DIAGNOSIS — Z7984 Long term (current) use of oral hypoglycemic drugs: Secondary | ICD-10-CM | POA: Diagnosis not present

## 2023-11-23 DIAGNOSIS — Z95810 Presence of automatic (implantable) cardiac defibrillator: Secondary | ICD-10-CM | POA: Insufficient documentation

## 2023-11-23 DIAGNOSIS — F1411 Cocaine abuse, in remission: Secondary | ICD-10-CM | POA: Insufficient documentation

## 2023-11-23 DIAGNOSIS — Z79899 Other long term (current) drug therapy: Secondary | ICD-10-CM | POA: Insufficient documentation

## 2023-11-23 DIAGNOSIS — I447 Left bundle-branch block, unspecified: Secondary | ICD-10-CM | POA: Insufficient documentation

## 2023-11-23 DIAGNOSIS — I110 Hypertensive heart disease with heart failure: Secondary | ICD-10-CM | POA: Diagnosis not present

## 2023-11-23 DIAGNOSIS — F1010 Alcohol abuse, uncomplicated: Secondary | ICD-10-CM | POA: Diagnosis not present

## 2023-11-23 DIAGNOSIS — Z86718 Personal history of other venous thrombosis and embolism: Secondary | ICD-10-CM | POA: Insufficient documentation

## 2023-11-23 DIAGNOSIS — G4733 Obstructive sleep apnea (adult) (pediatric): Secondary | ICD-10-CM | POA: Insufficient documentation

## 2023-11-23 LAB — LIPID PANEL
Cholesterol: 126 mg/dL (ref 0–200)
HDL: 37 mg/dL — ABNORMAL LOW (ref >40)
LDL Cholesterol: 21 mg/dL (ref 0–99)
Total CHOL/HDL Ratio: 3.4 ratio
Triglycerides: 342 mg/dL — ABNORMAL HIGH (ref <150)
VLDL: 68 mg/dL — ABNORMAL HIGH (ref 0–40)

## 2023-11-23 LAB — COMPREHENSIVE METABOLIC PANEL WITH GFR
ALT: 22 U/L (ref 0–44)
AST: 25 U/L (ref 15–41)
Albumin: 4.2 g/dL (ref 3.5–5.0)
Alkaline Phosphatase: 69 U/L (ref 38–126)
Anion gap: 10 (ref 5–15)
BUN: 18 mg/dL (ref 8–23)
CO2: 20 mmol/L — ABNORMAL LOW (ref 22–32)
Calcium: 9.5 mg/dL (ref 8.9–10.3)
Chloride: 107 mmol/L (ref 98–111)
Creatinine, Ser: 1.3 mg/dL — ABNORMAL HIGH (ref 0.61–1.24)
GFR, Estimated: 59 mL/min — ABNORMAL LOW (ref >60)
Glucose, Bld: 130 mg/dL — ABNORMAL HIGH (ref 70–99)
Potassium: 4.2 mmol/L (ref 3.5–5.1)
Sodium: 137 mmol/L (ref 135–145)
Total Bilirubin: 0.5 mg/dL (ref 0.0–1.2)
Total Protein: 7.9 g/dL (ref 6.5–8.1)

## 2023-11-23 MED ORDER — CARVEDILOL 6.25 MG PO TABS: 6.2500 mg | ORAL_TABLET | Freq: Two times a day (BID) | ORAL | 3 refills | Status: AC

## 2023-11-23 MED ORDER — TORSEMIDE 20 MG PO TABS: 20.0000 mg | ORAL_TABLET | ORAL | Status: AC | PRN

## 2023-11-23 NOTE — Progress Notes (Signed)
 ADVANCED HF CLINIC NOTE  Primary Care: Delana Corean GRADE, MD at the [facility] HF Cardiologist: Dr. Rolan EP: Dr Inocencio  Chief Complaint : f/u for chronic systolic heart failure   HPI: 68 y.o. male with history of LBBB (noted on ECGs in 2018), HTN, OSA on CPAP, iron deficiency anemia, GERD, alcohol, THC and cocaine use. [place] most of his care through the [facility].   Admitted 8/24 with acute systolic heart failure and PNA. Started on abx. Echo showed EF <20%, RV ok, IVC dilated and estimated RAP 15.  UDS + cocaine and THC. Diuresed with IV lasix . R/LHC showed no significant coronary disease. RHC with near normal filling pressures, and low CI at 2. Unable to complete cMRI with claustrophobia. GDMT titrated.  Echo 12/24 EF <20%, RV nl, trivial MR   CRT-D implanted 04/09/23. Followed by Dr. Inocencio.  ER visit 04/24/23, w/LUE swelling post implant >> DVT, started on Eliquis    Today, he returns for follow up. Doing well. Reports NYHA II symptoms, limited more so by chronic LBP and radiculopathy.  Says he stopped several meds. Stopped torsemide  due to frequent urination. Denies weight gain. No LEE. No orthopnea/PND. Stopped Coreg  (ran out of refills) Stopped Eliquis  (says he completed course). No further arm swelling.  Compliant w/ all other meds.   BP elevated at 148/82. Took meds this morning.   Device interrogation shows stable thoracic impedence. Fluid index well below threshold. No VT. No AT/AF. 98% BiV pacing.   Noncompliant w/ CPAP. Cannot tolerate face mask.    PMH: 1. Cocaine abuse 2. ETOH abuse 3. HTN 4. GERD 5. Chronic systolic CHF: Nonischemic cardiomyopathy.  - R/LHC (8/24): normal coronaries; RA 6, PA 34/17(25), PCWP 15, CO/Ci (Fick) 4.52/2, PVR 2.3 WU.  - Echo (8/24): EF < 20%, RV normal, dilated IVC, estimated RAP 15 - Echo (12/24): EF 20-25% with septal-lateral dyssynchrony, normal RV, IVC normal.  6. OSA: CPAP 7. Fe deficiency anemia.  8. Osteoarthritis  Current  Outpatient Medications  Medication Sig Dispense Refill   acetaminophen  (TYLENOL ) 500 MG tablet Take 500-1,000 mg by mouth every 6 (six) hours as needed for moderate pain (pain score 4-6). joint     apixaban  (ELIQUIS ) 5 MG TABS tablet Take 1 tablet (5 mg total) by mouth 2 (two) times daily. Start taking after completion of starter pack. 60 tablet 1   carvedilol  (COREG ) 6.25 MG tablet TAKE 1 TABLET BY MOUTH 2 TIMES DAILY WITH A MEAL. 180 tablet 1   Cholecalciferol (VITAMIN D) 50 MCG (2000 UT) tablet Take 2,000 Units by mouth daily.     cycloSPORINE (RESTASIS) 0.05 % ophthalmic emulsion Place 1 drop into both eyes daily.     diclofenac Sodium (VOLTAREN) 1 % GEL Apply 2 g topically 4 (four) times daily as needed (pain).     DULoxetine  (CYMBALTA ) 20 MG capsule Take 40 mg by mouth daily.     gabapentin  (NEURONTIN ) 300 MG capsule Take 300 mg by mouth 2 (two) times daily.     lidocaine  (LIDODERM ) 5 % Place 1 patch onto the skin daily as needed (pain).     losartan  (COZAAR ) 25 MG tablet TAKE 1 TABLET BY MOUTH TWICE A DAY 180 tablet 3   omeprazole (PRILOSEC) 20 MG capsule Take 20 mg by mouth daily.     potassium chloride  SA (KLOR-CON  M) 20 MEQ tablet Take 1 tablet (20 mEq total) by mouth daily. 90 tablet 3   rosuvastatin  (CRESTOR ) 10 MG tablet Take 1 tablet (10 mg total)  by mouth daily. 90 tablet 3   sildenafil (VIAGRA) 100 MG tablet Take 100 mg by mouth as needed for erectile dysfunction.     spironolactone  (ALDACTONE ) 25 MG tablet Take 1 tablet (25 mg total) by mouth daily. 90 tablet 3   torsemide  (DEMADEX ) 20 MG tablet Take 1 tablet (20 mg total) by mouth daily. 90 tablet 3   No current facility-administered medications for this encounter.   Allergies  Allergen Reactions   Lisinopril Swelling   Shellfish Allergy Hives   Shellfish-Derived Products Other (See Comments)   Social History   Socioeconomic History   Marital status: Single    Spouse name: Not on file   Number of children: 1   Years  of education: Not on file   Highest education level: Not on file  Occupational History   Occupation: retired  Tobacco Use   Smoking status: Former    Types: Cigarettes   Smokeless tobacco: Never   Tobacco comments:    Not able to remember when he quit  Vaping Use   Vaping status: Never Used  Substance and Sexual Activity   Alcohol use: Yes    Alcohol/week: 1.0 standard drink of alcohol    Types: 1 Cans of beer per week    Comment: social   Drug use: Not Currently    Frequency: 1.0 times per week    Types: Marijuana   Sexual activity: Not Currently  Other Topics Concern   Not on file  Social History Narrative   Not on file   Social Drivers of Health   Financial Resource Strain: Not on file  Food Insecurity: No Food Insecurity (10/20/2022)   Hunger Vital Sign    Worried About Running Out of Food in the Last Year: Never true    Ran Out of Food in the Last Year: Never true  Transportation Needs: No Transportation Needs (10/20/2022)   PRAPARE - Administrator, Civil Service (Medical): No    Lack of Transportation (Non-Medical): No  Physical Activity: Not on file  Stress: Not on file  Social Connections: Unknown (07/03/2021)   Received from [facility]   Social Network    Social Network: Not on file  Intimate Partner Violence: Not At Risk (10/20/2022)   Humiliation, Afraid, Rape, and Kick questionnaire    Fear of Current or Ex-Partner: No    Emotionally Abused: No    Physically Abused: No    Sexually Abused: No   Family History  Problem Relation Age of Onset   Stroke Mother    Other Father        patient was a kid when his Dad died, not sure of the cause   Colon cancer Neg Hx    Esophageal cancer Neg Hx    BP (!) 148/82   Pulse 94   Ht 6' 1 (1.854 m)   Wt 112.3 kg (247 lb 8 oz)   SpO2 97%   BMI 32.65 kg/m   Wt Readings from Last 3 Encounters:  11/23/23 112.3 kg (247 lb 8 oz)  07/27/23 111.6 kg (246 lb 1.6 oz)  07/20/23 109.8 kg (242 lb)    Physical Exam  GENERAL: NAD Lungs- clear CARDIAC:  JVP not elevated         Normal rate with regular rhythm. No MRG. No LEE  ABDOMEN: Soft, non-tender, non-distended.  EXTREMITIES: Warm and well perfused.  NEUROLOGIC: No obvious FND   ASSESSMENT & PLAN: 1. Chronic systolic CHF: New diagnosis.  Admitted  with CHF, echo (8/24): EF < 20%, moderate LV dilation, RV normal, IVC dilated.  No evidence for ACS, mildly elevated TnI with no trend is likely demand ischemia from volume overload.  Possible etiologies include substance abuse (cocaine/ETOH), long-standing HTN, ? myocarditis. Also has wide LBBB. No strong family history of CHF.  Coronary angiography showed no significant coronary disease; RHC with RA 6, PCWP 15, CI 2. Echo 12/24 EF 20-25% range with septal-lateral dyssynchrony, normal RV size and systolic function, IVC normal. S/p CRT-D 2/25.  - NYHA II, limited more by chronic LBP and radiculopathy  - Evuolemic on exam and device interrogation. No VT. 98% BiV paced   - Continue Torsemide  20 mg PRN  - Restart Coreg  6.25 mg bid  - Continue losartan  25 mg bid (no ACEI or ARNI with angioedema on ACEi).  - Continue spironolactone  25 mg daily  - Continue Jardiance  10 mg daily  - Unable to complete cardiac MRI due to claustrophobia. - Check CMP today  - Plan repeat echo next visit to check CRT responsiveness  2. HTN: Moderately elevated.  - restart Coreg  6.25 mg bid. All other GDMT per above  3. OSA: He has CPAP but doesn't use it. Cannot tolerate facemask   4. ETOH abuse: Drinks 1 glass of wine a day.  5. H/O Cocaine abuse: denies recent use  6. Hyperlipidemia, LD+ HFTs  7. H/o LUE DVT: diagnosed 3/25 after CRT-D implant, Completed Eliquis  x 3 months   F/u w/ Dr. Rolan in 3-4 months. Plan repeat echo to assess responsiveness to CRT.   Caffie Shed PA-C  11/23/23

## 2023-11-23 NOTE — Patient Instructions (Signed)
 Medication Changes:  RESTART CARVEDILOL  6.25MG  TWICE DAILY   Lab Work:  Labs done today, your results will be available in MyChart, we will contact you for abnormal readings.  Follow-Up in: 3 MONTHS AS SCHEDULED WITH DR. ROLAN   At the [facility], you and your health needs are our priority. We have a designated team specialized in the treatment of Heart Failure. This Care Team includes your primary Heart Failure Specialized Cardiologist (physician), Advanced Practice Providers (APPs- Physician Assistants and Nurse Practitioners), and Pharmacist who all work together to provide you with the care you need, when you need it.   You may see any of the following providers on your designated Care Team at your next follow up:  Dr. Toribio Fuel Dr. Ezra ROLAN Dr. Ria Commander Dr. Odis Brownie Greig Mosses, NP Caffie Shed, [place] [facility] [place], [place] Beckey Coe, NP [place] Lee, NP Tinnie Redman, PharmD   Please be sure to bring in all your medications bottles to every appointment.   Need to Contact Us :  If you have any questions or concerns before your next appointment please send us  a message through [place] or call our office at [phone].    TO LEAVE A MESSAGE FOR THE NURSE SELECT OPTION 2, PLEASE LEAVE A MESSAGE INCLUDING: YOUR NAME DATE OF BIRTH CALL BACK NUMBER REASON FOR CALL**this is important as we prioritize the call backs  YOU WILL RECEIVE A CALL BACK THE SAME DAY AS LONG AS YOU CALL BEFORE 4:00 PM

## 2023-11-24 ENCOUNTER — Telehealth: Payer: Self-pay | Admitting: Cardiology

## 2023-11-24 ENCOUNTER — Ambulatory Visit: Admitting: Nurse Practitioner

## 2023-11-24 ENCOUNTER — Other Ambulatory Visit (INDEPENDENT_AMBULATORY_CARE_PROVIDER_SITE_OTHER)

## 2023-11-24 ENCOUNTER — Encounter: Payer: Self-pay | Admitting: Nurse Practitioner

## 2023-11-24 VITALS — BP 130/90 | HR 82 | Ht 73.0 in | Wt 246.0 lb

## 2023-11-24 DIAGNOSIS — D509 Iron deficiency anemia, unspecified: Secondary | ICD-10-CM

## 2023-11-24 DIAGNOSIS — Z860101 Personal history of adenomatous and serrated colon polyps: Secondary | ICD-10-CM

## 2023-11-24 DIAGNOSIS — K219 Gastro-esophageal reflux disease without esophagitis: Secondary | ICD-10-CM | POA: Diagnosis not present

## 2023-11-24 DIAGNOSIS — F1410 Cocaine abuse, uncomplicated: Secondary | ICD-10-CM

## 2023-11-24 NOTE — Progress Notes (Signed)
 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  He is known by Dr. Stacia.  He presents to our office today as [facility] for follow-up regarding iron deficiency anemia.  He is known by Dr. Stacia.  He stated having recent laboratory studies done at the [facility] which showed he had anemia with low iron levels.  He stated that he is scheduled to receive IV iron infusions at the [facility] within the next week or two.  He denies having any nausea or vomiting.  No upper or lower abdominal pain.  No heartburn.  He describes having episodes of coughing and feels choked after he sucks on ice or drink small sips of water.  He denies any difficulty swallowing other liquids, solid foods or pills.  He is taking omeprazole 20 mg once daily.  He is passing normal formed brown bowel movement daily.  No bloody or black stools.  He endorses drinking 1 pint of wine every other day.  No cocaine use since he had his ICD placed 03/2023.  He smokes marijuana most days.  On ASA 81 mg daily.  He underwent an EGD and colonoscopy by Dr. Stacia 07/28/2022 for further evaluation regarding IDA.  The EGD showed reactive gastropathy otherwise was unremarkable.  The colonoscopy identified one 8 mm tubular adenomatous polyp removed from the distal sigmoid colon, mild diverticulosis in the descending colon without evidence of diverticular bleeding and hypertrophied anal papillae.  He was previously followed by [facility] health hematology regarding his IDA and received intermittent IV iron February, April and October 2024.  He stated he is now followed by hematologist at the [facility] with plans for IV infusion within the next week or 2 as noted above.  He  developed acute left upper extremity DVT status post ICD placement 04/13/2023 and was placed on Eliquis  which he is no longer taking, he stated was discontinued.  Labs 11/16/2023 at the [facility]: IBC 464.  Iron saturation 7.  Iron 31.  Transferrin 428.  Ferritin 8.8.  Hemoglobin 11.6.  Hematocrit 35.6.  MCV 70.4.  Platelets 293.     Latest Ref Rng & Units 05/31/2023    4:00 PM 05/25/2023   12:29 PM 03/23/2023   10:41 AM  CBC  WBC 4.0 - 10.5 K/uL 7.1  5.7  5.0   Hemoglobin 13.0 - 17.0 g/dL 87.9  86.8  85.7   Hematocrit 39.0 - 52.0 % 35.9  38.2  44.0   Platelets 150 - 400 K/uL 265  262  289        Latest Ref Rng & Units 11/23/2023   10:56 AM 05/31/2023    4:00 PM 03/23/2023   10:41 AM  CMP  Glucose 70 - 99 mg/dL 869  879  857   BUN 8 - 23 mg/dL 18  20  25    Creatinine 0.61 - 1.24 mg/dL 8.69  8.61  8.44   Sodium 135 - 145 mmol/L 137  137  140   Potassium 3.5 - 5.1 mmol/L 4.2  3.9  4.8   Chloride 98 - 111 mmol/L 107  99  99   CO2 22 - 32 mmol/L 20  27  22    Calcium  8.9 - 10.3 mg/dL 9.5  9.8  89.8  Total Protein 6.5 - 8.1 g/dL 7.9     Total Bilirubin 0.0 - 1.2 mg/dL 0.5     Alkaline Phos 38 - 126 U/L 69     AST 15 - 41 U/L 25     ALT 0 - 44 U/L 22       ECHO 02/01/2023: Global hypokinesis, with severe hypokidnesis of the septal and anterior wall and mild hypokinesis of the inferior and lateral wall. Left ventricular ejection fraction, by estimation, is <20%. The left ventricle has severely decreased function. The left ventricle demonstrates global hypokinesis. The left ventricular internal cavity size was severely dilated. Left ventricular diastolic parameters are indeterminate. 1. 2. Right ventricular systolic function is normal. The right ventricular size is normal. 3. Left atrial size was moderately dilated. The mitral valve is normal in structure. Trivial mitral valve regurgitation. No evidence of mitral stenosis. 4. The aortic valve is tricuspid. Aortic valve regurgitation is not visualized. No  aortic stenosis is present. 5. The inferior vena cava is normal in size with greater than 50% respiratory variability, suggesting right atrial pressure of 3 mmHg.  GI PROCEDURES:  EGD 07/28/2022: - The examined portions of the nasopharynx, oropharynx and larynx were normal.  - Normal esophagus.  - Normal stomach. Biopsied.  - Normal examined duodenum. Biopsied.  - No endoscopic abnormalities to explain iron deficiency anemia.   Colonoscopy 07/28/2022: - One 8 mm polyp in the distal sigmoid colon, removed with a cold snare. Resected and retrieved.  - Mild diverticulosis in the descending colon. There was no evidence of diverticular bleeding.  - The examined portion of the ileum was normal.  - Anal papilla(e) were hypertrophied. - Repeat colonoscopy in 7 years  1. Surgical [P], duodenal - BENIGN SMALL BOWEL MUCOSA WITH NO SIGNIFICANT PATHOLOGIC CHANGES 2. Surgical [P], gastric antrum and body - GASTRIC ANTRAL AND OXYNTIC MUCOSA WITH FEATURES OF REACTIVE GASTROPATHY - NEGATIVE FOR H. PYLORI ON H&E STAIN - NEGATIVE FOR INTESTINAL METAPLASIA OR MALIGNANCY 3. Surgical [P], sigmoid, polyp (1) - TUBULAR ADENOMA. - NO HIGH GRADE DYSPLASIA OR MALIGNANCY.  Past Medical History:  Diagnosis Date   Chronic GERD    Hypertension    Pain    Pre-diabetes    Past Surgical History:  Procedure Laterality Date   BIV ICD INSERTION CRT-D N/A 04/13/2023   Procedure: BIV ICD INSERTION CRT-D;  Surgeon: Inocencio Soyla Lunger, MD;  Location: [facility] INVASIVE CV LAB;  Service: Cardiovascular;  Laterality: N/A;   COLONOSCOPY     ESOPHAGOGASTRODUODENOSCOPY     RIGHT/LEFT HEART CATH AND CORONARY ANGIOGRAPHY N/A 10/22/2022   Procedure: RIGHT/LEFT HEART CATH AND CORONARY ANGIOGRAPHY;  Surgeon: Rolan Ezra RAMAN, MD;  Location: [facility] INVASIVE CV LAB;  Service: Cardiovascular;  Laterality: N/A;   Current Outpatient Medications on File Prior to Visit  Medication Sig Dispense Refill   acetaminophen  (TYLENOL ) 500 MG tablet  Take 500-1,000 mg by mouth every 6 (six) hours as needed for moderate pain (pain score 4-6). joint     carvedilol  (COREG ) 6.25 MG tablet Take 1 tablet (6.25 mg total) by mouth 2 (two) times daily with a meal. 180 tablet 3   Cholecalciferol (VITAMIN D) 50 MCG (2000 UT) tablet Take 2,000 Units by mouth daily.     cycloSPORINE (RESTASIS) 0.05 % ophthalmic emulsion Place 1 drop into both eyes daily.     diclofenac Sodium (VOLTAREN) 1 % GEL Apply 2 g topically 4 (four) times daily as needed (pain).     DULoxetine  (CYMBALTA ) 20 MG capsule  Take 40 mg by mouth daily.     gabapentin  (NEURONTIN ) 300 MG capsule Take 300 mg by mouth 2 (two) times daily.     lidocaine  (LIDODERM ) 5 % Place 1 patch onto the skin daily as needed (pain).     losartan  (COZAAR ) 25 MG tablet TAKE 1 TABLET BY MOUTH TWICE A DAY 180 tablet 3   omeprazole (PRILOSEC) 20 MG capsule Take 20 mg by mouth daily.     potassium chloride  SA (KLOR-CON  M) 20 MEQ tablet Take 1 tablet (20 mEq total) by mouth daily. 90 tablet 3   rosuvastatin  (CRESTOR ) 10 MG tablet Take 1 tablet (10 mg total) by mouth daily. 90 tablet 3   sildenafil (VIAGRA) 100 MG tablet Take 100 mg by mouth as needed for erectile dysfunction.     spironolactone  (ALDACTONE ) 25 MG tablet Take 1 tablet (25 mg total) by mouth daily. 90 tablet 3   torsemide  (DEMADEX ) 20 MG tablet Take 1 tablet (20 mg total) by mouth as needed.     apixaban  (ELIQUIS ) 5 MG TABS tablet Take 1 tablet (5 mg total) by mouth 2 (two) times daily. Start taking after completion of starter pack. (Patient not taking: Reported on 11/24/2023) 60 tablet 1   No current facility-administered medications on file prior to visit.   Allergies  Allergen Reactions   Lisinopril Swelling   Shellfish Allergy Hives   Shellfish-Derived Products Other (See Comments)   Current Medications, Allergies, Past Medical History, Past Surgical History, Family History and Social History were reviewed in [employer]  record.  Review of Systems:   Constitutional: Negative for fever, sweats, chills or weight loss.  Respiratory: Negative for shortness of breath.   Cardiovascular: Negative for chest pain, palpitations and leg swelling.  Gastrointestinal: See HPI.  Musculoskeletal: Negative for back pain or muscle aches.  Neurological: Negative for dizziness, headaches or paresthesias.   Physical Exam: BP (!) 130/90   Pulse 82   Ht 6' 1 (1.854 m)   Wt 246 lb (111.6 kg)   BMI 32.46 kg/m   Wt Readings from Last 3 Encounters:  11/24/23 246 lb (111.6 kg)  11/23/23 247 lb 8 oz (112.3 kg)  07/27/23 246 lb 1.6 oz (111.6 kg)    General: 68 year old male in no acute distress. Head: Normocephalic and atraumatic. Eyes: No scleral icterus. Conjunctiva pink . Ears: Normal auditory acuity. Mouth: Dentition intact. No ulcers or lesions.  Lungs: Clear throughout to auscultation. Heart: Distant heart tones, regular rate and rhythm, no murmur. Abdomen: Soft, nontender and nondistended. No masses or hepatomegaly. Normal bowel sounds x 4 quadrants.  Rectal: Deferred. Musculoskeletal: Symmetrical with no gross deformities. Extremities: No edema. Neurological: Alert oriented x 4. No focal deficits.  Psychological: Alert and cooperative. Normal mood and affect  Assessment and Recommendations:  68 year old male with iron deficiency anemia, etiology unclear.  EGD and colonoscopy 07/2022 findings did not identify etiology for IDA.  No overt GI bleeding.  Previously followed by [facility] health hematology and received IV iron x 3 in 2024.  Patient stated he is now followed by a hematologist at the [facility] with plans for IV iron in the next week or 2. - CBC, IBC + Ferritin level  - I suspect he is not a candidate for small bowel capsule endoscopy secondary to his ICD, to discuss case further with Dr. Stacia - Proceed with IV iron planned at the [facility] clinic - Patient instructed to go to the emergency room if he develops  profound fatigue,  chest pain, shortness of breath or dizziness  GERD, stable - Continue Omeprazole 20 mg daily  Colon polyps.  Colonoscopy 07/2022 identified one 8 mm tubular adenomatous polyp removed from the sigmoid colon. - Next colon polyp surveillance colonoscopy due 07/2029  Chronic systolic heart failure, nonischemic cardiomyopathy.  LVEF < 20%. S/P biventricular ICD 03/2023.  Polysubstance abuse - Encouraged patient to remain abstinent from cocaine.  Recommended alcohol and marijuana cessation.

## 2023-11-24 NOTE — Patient Instructions (Addendum)
 _______________________________________________________  If your blood pressure at your visit was 140/90 or greater, please contact your primary care physician to follow up on this.  _______________________________________________________  If you are age 70 or older, your body mass index should be between 23-30. Your Body mass index is 32.46 kg/m. If this is out of the aforementioned range listed, please consider follow up with your Primary Care Provider.  If you are age 52 or younger, your body mass index should be between 19-25. Your Body mass index is 32.46 kg/m. If this is out of the aformentioned range listed, please consider follow up with your Primary Care Provider.   ________________________________________________________  The [facility] providers would like to encourage you to use MYCHART to communicate with providers for non-urgent requests or questions.  Due to long hold times on the telephone, sending your provider a message by [facility] may be a faster and more efficient way to get a response.  Please allow 48 business hours for a response.  Please remember that this is for non-urgent requests.  _______________________________________________________  [facility] is using a team-based approach to care.  Your team is made up of your doctor and two to three APPS. Our APPS (Nurse Practitioners and Physician Assistants) work with your physician to ensure care continuity for you. They are fully qualified to address your health concerns and develop a treatment plan. They communicate directly with your gastroenterologist to care for you. Seeing the Advanced Practice Practitioners on your physician's team can help you by facilitating care more promptly, often allowing for earlier appointments, access to diagnostic testing, procedures, and other specialty referrals.   Your provider has requested that you go to the basement level for lab work before leaving today. Press B on the  elevator. The lab is located at the first door on the left as you exit the elevator.  Further GI recommendations to be determined after lab results received   Thank you for trusting me with your gastrointestinal care!   Elida Shawl, CRNP

## 2023-11-24 NOTE — Telephone Encounter (Signed)
 Patient says he has been going back and forth with [facility] and he is in a lot of pain. He's following up regarding a clearance request they faxed to the office. Has this been received? If not, patient would like to know if our pre-op can contact [facility] directly for clearance at [phone]. Patient is concerned because it's a time sensitive matter due to the [facility],

## 2023-11-25 ENCOUNTER — Ambulatory Visit

## 2023-11-25 ENCOUNTER — Ambulatory Visit: Payer: No Typology Code available for payment source

## 2023-11-25 DIAGNOSIS — I5022 Chronic systolic (congestive) heart failure: Secondary | ICD-10-CM

## 2023-11-25 LAB — IBC + FERRITIN
Ferritin: 12.5 ng/mL — ABNORMAL LOW (ref 22.0–322.0)
Iron: 51 ug/dL (ref 42–165)
Saturation Ratios: 8.4 % — ABNORMAL LOW (ref 20.0–50.0)
TIBC: 609 ug/dL — ABNORMAL HIGH (ref 250.0–450.0)
Transferrin: 435 mg/dL — ABNORMAL HIGH (ref 212.0–360.0)

## 2023-11-25 LAB — CBC
HCT: 37.6 % — ABNORMAL LOW (ref 39.0–52.0)
Hemoglobin: 11.8 g/dL — ABNORMAL LOW (ref 13.0–17.0)
MCHC: 31.3 g/dL (ref 30.0–36.0)
MCV: 71.4 fl — ABNORMAL LOW (ref 78.0–100.0)
Platelets: 323 K/uL (ref 150.0–400.0)
RBC: 5.26 Mil/uL (ref 4.22–5.81)
RDW: 19.2 % — ABNORMAL HIGH (ref 11.5–15.5)
WBC: 6.4 K/uL (ref 4.0–10.5)

## 2023-11-25 NOTE — Telephone Encounter (Signed)
 Preop callback -  Can you please get more info? Please see Brittany's note.

## 2023-11-25 NOTE — Telephone Encounter (Signed)
   Patient Name: Maxwell Harris  DOB: 1955-03-27 MRN: [id number]  Primary Cardiologist: None  Chart reviewed as part of pre-operative protocol coverage. Given past medical history and time since last visit, based on ACC/AHA guidelines, Maxwell Harris is at acceptable risk for the planned procedure without further cardiovascular testing.   The patient was advised that if he develops new symptoms prior to surgery to contact our office to arrange for a follow-up visit, and he verbalized understanding.  Patient is no longer on Eliquis .  I will route this recommendation to the requesting party via Epic fax function and remove from pre-op pool.  Please call with questions.  Wyn Raddle, Jackee Shove, NP 11/25/2023, 11:49 AM

## 2023-11-25 NOTE — Telephone Encounter (Signed)
 Our office has not yet received a clearance request. I will see if I can reach out to the DDS. The fax probably was sent to the HF clinic, but I will see what I can do.        Pre-operative Risk Assessment    Patient Name: Maxwell Harris  DOB: 02-19-1956 MRN: [id number]   Date of last office visit: 11/23/23 Maxwell Harris, [facility] Date of next office visit: 02/22/24 DR. MCLEAN  THE PT WIL HAVE MULTIPLE TREATMENT PLAN WHICH WILL REQUIRE A NEW FAX TO BE SENT AT THE TIME WHEN READY FOR NEXT TREATMENT; HOWEVER SEE BELOW PROCEDURE AT THIS TIME  Request for Surgical Clearance    Procedure:  DEEP DENTAL CLEANING (SRP[SCALING ROOT PLANING)  Date of Surgery:  Clearance TBD                                Surgeon:  DR. LAMAR RATTLER, DDS Surgeon's Group or Practice Name:  [facility] Phone number:  [phone] Fax number:  [phone]   Type of Clearance Requested:   - Medical  - Pharmacy:  Hold Apixaban  (Eliquis ) HOWEVER THERE IS A NOTE ON MED LIST NOT TAKING   Type of Anesthesia:  Local    Additional requests/questions:    Maxwell Harris   11/25/2023, 8:57 AM

## 2023-11-26 ENCOUNTER — Ambulatory Visit: Payer: Self-pay | Admitting: Nurse Practitioner

## 2023-11-26 LAB — CUP PACEART REMOTE DEVICE CHECK
Battery Remaining Longevity: 129 mo
Battery Voltage: 3.04 V
Brady Statistic AP VP Percent: 3.28 %
Brady Statistic AP VS Percent: 0.06 %
Brady Statistic AS VP Percent: 94.28 %
Brady Statistic AS VS Percent: 2.38 %
Brady Statistic RA Percent Paced: 3.64 %
Brady Statistic RV Percent Paced: 11.86 %
Date Time Interrogation Session: 20251002012344
HighPow Impedance: 70 Ohm
Implantable Lead Connection Status: 753985
Implantable Lead Connection Status: 753985
Implantable Lead Connection Status: 753985
Implantable Lead Implant Date: 20250218
Implantable Lead Implant Date: 20250218
Implantable Lead Implant Date: 20250218
Implantable Lead Location: 753858
Implantable Lead Location: 753859
Implantable Lead Location: 753860
Implantable Lead Model: 4598
Implantable Lead Model: 5076
Implantable Pulse Generator Implant Date: 20250218
Lead Channel Impedance Value: 323 Ohm
Lead Channel Impedance Value: 361 Ohm
Lead Channel Impedance Value: 399 Ohm
Lead Channel Impedance Value: 399 Ohm
Lead Channel Impedance Value: 399 Ohm
Lead Channel Impedance Value: 418 Ohm
Lead Channel Impedance Value: 437 Ohm
Lead Channel Impedance Value: 532 Ohm
Lead Channel Impedance Value: 684 Ohm
Lead Channel Impedance Value: 722 Ohm
Lead Channel Impedance Value: 722 Ohm
Lead Channel Impedance Value: 760 Ohm
Lead Channel Impedance Value: 760 Ohm
Lead Channel Pacing Threshold Amplitude: 0.5 V
Lead Channel Pacing Threshold Amplitude: 0.5 V
Lead Channel Pacing Threshold Amplitude: 0.875 V
Lead Channel Pacing Threshold Pulse Width: 0.4 ms
Lead Channel Pacing Threshold Pulse Width: 0.4 ms
Lead Channel Pacing Threshold Pulse Width: 0.4 ms
Lead Channel Sensing Intrinsic Amplitude: 10.3 mV
Lead Channel Sensing Intrinsic Amplitude: 2.6 mV
Lead Channel Setting Pacing Amplitude: 1.5 V
Lead Channel Setting Pacing Amplitude: 1.5 V
Lead Channel Setting Pacing Amplitude: 1.5 V
Lead Channel Setting Pacing Pulse Width: 0.4 ms
Lead Channel Setting Pacing Pulse Width: 0.4 ms
Lead Channel Setting Sensing Sensitivity: 0.3 mV
Zone Setting Status: 755011
Zone Setting Status: 755011
Zone Setting Status: 755011

## 2023-11-29 ENCOUNTER — Other Ambulatory Visit: Payer: Self-pay | Admitting: Physician Assistant

## 2023-11-29 ENCOUNTER — Ambulatory Visit: Payer: Self-pay | Admitting: Cardiology

## 2023-11-29 ENCOUNTER — Telehealth (HOSPITAL_COMMUNITY): Payer: Self-pay | Admitting: *Deleted

## 2023-11-29 DIAGNOSIS — D509 Iron deficiency anemia, unspecified: Secondary | ICD-10-CM

## 2023-11-29 NOTE — Telephone Encounter (Signed)
  [facility]   Pre-operative Risk Assessment    Request for Surgical Clearance    Procedure:  Dental Extraction - Amount of Teeth to be Pulled:  ALL upper teeth, surgical with bone grafts and alveoplasty (recontouring bone) AND 6 crowns  Date of Surgery:  Clearance TBD                                 Surgeon:  Dr Carnaggio/Dr Jackquline Surgeon's Group or Practice Name:  [facility] Phone number:  [phone] Fax number:  [phone]   Type of Clearance Requested:   Medical   Type of Anesthesia:  Local  with or without Epinephrine   Additional requests/questions:  Does this patient need antibiotics? Anesthetic Restrictions? Is Epinephrine ok?  PER 11/23/23 OV note pt's has completed 3 months of Eliquis  for h/o LUE DVT  Sent to provider for further review  Signed, Powell Latino   11/29/2023, 4:55 PM

## 2023-11-29 NOTE — Progress Notes (Signed)
 Agree with the assessment and plan as outlined by Elida Shawl, NP. Although there have been theoretical concerns about interference of the capsule device and implantable cardiac devices, studies have not shown any evidence of this and current guidance is that VCE is safe for patients with ICDs.  It is recommended that the device be checked before and after the capsule study as an extra precaution.  I would recommend making his cardiologist aware and request a device interrogation before and after the procedure.  Otherwise, ok to proceed with VCE.   Wanda Rideout E. Stacia, MD [facility]

## 2023-11-29 NOTE — Progress Notes (Signed)
 Remote ICD Transmission

## 2023-11-30 ENCOUNTER — Other Ambulatory Visit: Payer: Self-pay | Admitting: Hematology and Oncology

## 2023-11-30 ENCOUNTER — Inpatient Hospital Stay: Attending: Physician Assistant

## 2023-11-30 ENCOUNTER — Inpatient Hospital Stay: Admitting: Physician Assistant

## 2023-11-30 DIAGNOSIS — D509 Iron deficiency anemia, unspecified: Secondary | ICD-10-CM

## 2023-11-30 NOTE — Telephone Encounter (Signed)
 Faxed via epic.

## 2023-11-30 NOTE — Telephone Encounter (Signed)
 Dental office calling back to f/u please advise

## 2023-12-01 ENCOUNTER — Encounter (HOSPITAL_COMMUNITY): Payer: Self-pay

## 2023-12-02 NOTE — Progress Notes (Signed)
 Remote ICD Transmission

## 2023-12-07 NOTE — Progress Notes (Signed)
 Dr. Stacia, I contacted the patient and discussed your recommendations and your addendum note below.  Patient does not wish to pursue a small bowel capsule endoscopy as he does not want to undergo any test or procedure with potential risk for worsening his health status or potentially require surgical intervention.  I did discuss the potential risk of a small bowel capsule endoscopy which included if the capsule became stuck, possible surgery might be required to remove it.  He understandably does not except this risk.  He is more comfortable continuing follow-up with his hematologist at the [facility] for IV infusions and potential blood transfusions as needed. Let me know if you have any further recommendations and I will contact the patient.  Thank you.

## 2023-12-12 NOTE — Progress Notes (Signed)
 Noted.  Risk of VCE is very low, but not zero.  Low suspicion for significant small bowel pathology such as mass lesion, but also not zero.  Can continue with supportive care per patient's request.  Recommend VCE if he changes his mind.

## 2024-01-03 ENCOUNTER — Ambulatory Visit: Attending: Cardiology | Admitting: Cardiology

## 2024-01-03 ENCOUNTER — Encounter: Payer: Self-pay | Admitting: Cardiology

## 2024-01-03 VITALS — BP 140/94 | HR 68 | Ht 73.0 in | Wt 241.0 lb

## 2024-01-03 DIAGNOSIS — Z95810 Presence of automatic (implantable) cardiac defibrillator: Secondary | ICD-10-CM | POA: Diagnosis not present

## 2024-01-03 DIAGNOSIS — I10 Essential (primary) hypertension: Secondary | ICD-10-CM

## 2024-01-03 DIAGNOSIS — I5022 Chronic systolic (congestive) heart failure: Secondary | ICD-10-CM | POA: Diagnosis not present

## 2024-01-03 DIAGNOSIS — G4733 Obstructive sleep apnea (adult) (pediatric): Secondary | ICD-10-CM | POA: Diagnosis not present

## 2024-01-03 LAB — CUP PACEART INCLINIC DEVICE CHECK
Date Time Interrogation Session: 20251110125348
HighPow Impedance: 68 Ohm
Implantable Lead Connection Status: 753985
Implantable Lead Connection Status: 753985
Implantable Lead Connection Status: 753985
Implantable Lead Implant Date: 20250218
Implantable Lead Implant Date: 20250218
Implantable Lead Implant Date: 20250218
Implantable Lead Location: 753858
Implantable Lead Location: 753859
Implantable Lead Location: 753860
Implantable Lead Model: 4598
Implantable Lead Model: 5076
Implantable Pulse Generator Implant Date: 20250218
Lead Channel Impedance Value: 418 Ohm
Lead Channel Impedance Value: 494 Ohm
Lead Channel Impedance Value: 741 Ohm
Lead Channel Pacing Threshold Amplitude: 0.5 V
Lead Channel Pacing Threshold Amplitude: 0.75 V
Lead Channel Pacing Threshold Amplitude: 1 V
Lead Channel Pacing Threshold Pulse Width: 0.4 ms
Lead Channel Pacing Threshold Pulse Width: 0.4 ms
Lead Channel Pacing Threshold Pulse Width: 0.4 ms
Lead Channel Sensing Intrinsic Amplitude: 12 mV
Lead Channel Sensing Intrinsic Amplitude: 2.8 mV

## 2024-01-03 NOTE — Progress Notes (Signed)
  Electrophysiology Office Note:   Date:  01/03/2024  ID:  Maxwell Harris, DOB 1956/01/04, MRN [id number]  Primary Cardiologist: None Primary Heart Failure: Ezra Shuck, MD Electrophysiologist: Stephanne Greeley Gladis Norton, MD      History of Present Illness:   Maxwell Harris is a 68 y.o. male with h/o hypertension, sleep apnea, alcohol/THC/cocaine abuse, CHF seen today for routine electrophysiology followup.   Since last being seen in our clinic the patient reports doing well.  He has no symptoms.  He is able to do his daily activities.  he denies chest pain, palpitations, dyspnea, PND, orthopnea, nausea, vomiting, dizziness, syncope, edema, weight gain, or early satiety.   Review of systems complete and found to be negative unless listed in HPI.      EP Information / Studies Reviewed:    EKG is ordered today. Personal review as below.  EKG Interpretation Date/Time:  Monday January 03 2024 11:33:53 EST Ventricular Rate:  68 PR Interval:  154 QRS Duration:  132 QT Interval:  426 QTC Calculation: 452 R Axis:   104  Text Interpretation: Atrial-sensed ventricular-paced rhythm When compared with ECG of 31-May-2023 15:36, Vent. rate has decreased BY   7 BPM Confirmed by Gesselle Fitzsimons ([id number]) on 01/03/2024 11:36:38 AM   ICD Interrogation-  reviewed in detail today,  See PACEART report.  Device History: Medtronic BiV ICD implanted 04/09/2023 for chronic systolic heart failure History of appropriate therapy: No History of AAD therapy: No   Risk Assessment/Calculations:     Physical Exam:   VS:  BP (!) 140/94 (BP Location: Right Arm, Patient Position: Sitting, Cuff Size: Large)   Pulse 68   Ht 6' 1 (1.854 m)   Wt 241 lb (109.3 kg)   SpO2 98%   BMI 31.80 kg/m    Wt Readings from Last 3 Encounters:  01/03/24 241 lb (109.3 kg)  11/24/23 246 lb (111.6 kg)  11/23/23 247 lb 8 oz (112.3 kg)     GEN: Well nourished, well developed in no acute distress NECK: No JVD; No carotid  bruits CARDIAC: Regular rate and rhythm, no murmurs, rubs, gallops RESPIRATORY:  Clear to auscultation without rales, wheezing or rhonchi  ABDOMEN: Soft, non-tender, non-distended EXTREMITIES:  No edema; No deformity   ASSESSMENT AND PLAN:    Chronic systolic dysfunction s/p Medtronic CRT-D  euvolemic today Stable on an appropriate medical regimen Normal ICD function See Pace Art report No changes today  2.  Hypertension: Mildly elevated today.  Plan per primary cardiology and primary physician  3.  Obstructive sleep apnea: CPAP compliance encouraged  Disposition:   Follow up with EP Team in 12 months   Signed, Laurna Shetley Gladis Norton, MD

## 2024-01-03 NOTE — Patient Instructions (Signed)

## 2024-01-04 ENCOUNTER — Ambulatory Visit: Payer: Self-pay | Admitting: Cardiology

## 2024-01-10 ENCOUNTER — Other Ambulatory Visit (HOSPITAL_COMMUNITY): Payer: Self-pay | Admitting: Family Medicine

## 2024-01-10 DIAGNOSIS — I5022 Chronic systolic (congestive) heart failure: Secondary | ICD-10-CM

## 2024-01-28 ENCOUNTER — Ambulatory Visit (HOSPITAL_COMMUNITY): Admitting: Cardiology

## 2024-02-22 ENCOUNTER — Encounter (HOSPITAL_COMMUNITY): Admitting: Cardiology

## 2024-02-25 ENCOUNTER — Ambulatory Visit (INDEPENDENT_AMBULATORY_CARE_PROVIDER_SITE_OTHER): Payer: No Typology Code available for payment source

## 2024-02-25 DIAGNOSIS — I5022 Chronic systolic (congestive) heart failure: Secondary | ICD-10-CM | POA: Diagnosis not present

## 2024-02-27 LAB — CUP PACEART REMOTE DEVICE CHECK
Battery Remaining Longevity: 127 mo
Battery Voltage: 3.02 V
Brady Statistic AP VP Percent: 11 %
Brady Statistic AP VS Percent: 0.2 %
Brady Statistic AS VP Percent: 84.56 %
Brady Statistic AS VS Percent: 4.24 %
Brady Statistic RA Percent Paced: 12.38 %
Brady Statistic RV Percent Paced: 7.16 %
Date Time Interrogation Session: 20260102063856
HighPow Impedance: 77 Ohm
Implantable Lead Connection Status: 753985
Implantable Lead Connection Status: 753985
Implantable Lead Connection Status: 753985
Implantable Lead Implant Date: 20250218
Implantable Lead Implant Date: 20250218
Implantable Lead Implant Date: 20250218
Implantable Lead Location: 753858
Implantable Lead Location: 753859
Implantable Lead Location: 753860
Implantable Lead Model: 4598
Implantable Lead Model: 5076
Implantable Pulse Generator Implant Date: 20250218
Lead Channel Impedance Value: 361 Ohm
Lead Channel Impedance Value: 361 Ohm
Lead Channel Impedance Value: 380 Ohm
Lead Channel Impedance Value: 418 Ohm
Lead Channel Impedance Value: 418 Ohm
Lead Channel Impedance Value: 418 Ohm
Lead Channel Impedance Value: 456 Ohm
Lead Channel Impedance Value: 513 Ohm
Lead Channel Impedance Value: 665 Ohm
Lead Channel Impedance Value: 684 Ohm
Lead Channel Impedance Value: 722 Ohm
Lead Channel Impedance Value: 741 Ohm
Lead Channel Impedance Value: 779 Ohm
Lead Channel Pacing Threshold Amplitude: 0.5 V
Lead Channel Pacing Threshold Amplitude: 0.5 V
Lead Channel Pacing Threshold Amplitude: 1 V
Lead Channel Pacing Threshold Pulse Width: 0.4 ms
Lead Channel Pacing Threshold Pulse Width: 0.4 ms
Lead Channel Pacing Threshold Pulse Width: 0.4 ms
Lead Channel Sensing Intrinsic Amplitude: 13.1 mV
Lead Channel Sensing Intrinsic Amplitude: 2.4 mV
Lead Channel Setting Pacing Amplitude: 1.5 V
Lead Channel Setting Pacing Amplitude: 1.5 V
Lead Channel Setting Pacing Amplitude: 1.5 V
Lead Channel Setting Pacing Pulse Width: 0.4 ms
Lead Channel Setting Pacing Pulse Width: 0.4 ms
Lead Channel Setting Sensing Sensitivity: 0.3 mV
Zone Setting Status: 755011
Zone Setting Status: 755011
Zone Setting Status: 755011

## 2024-02-28 ENCOUNTER — Ambulatory Visit (HOSPITAL_COMMUNITY): Admitting: Cardiology

## 2024-02-28 ENCOUNTER — Ambulatory Visit: Payer: Self-pay | Admitting: Cardiology

## 2024-02-29 ENCOUNTER — Other Ambulatory Visit (HOSPITAL_COMMUNITY): Payer: Self-pay | Admitting: Family Medicine

## 2024-03-01 NOTE — Progress Notes (Signed)
 Remote ICD Transmission

## 2024-03-10 ENCOUNTER — Telehealth (HOSPITAL_COMMUNITY): Payer: Self-pay | Admitting: Cardiology

## 2024-03-10 NOTE — Telephone Encounter (Signed)
 Called to confirm/remind patient of their appointment at the [facility] on 03/10/24.   Appointment:   [] Confirmed  [] Left mess   [] No answer/No voice mail  [] VM Full/unable to leave message  [] Phone not in service  Patient reminded to bring all medications and/or complete list.  Confirmed patient has transportation. Gave directions, instructed to utilize valet parking.

## 2024-03-10 NOTE — Telephone Encounter (Signed)
 Called to confirm/remind patient of their appointment at the [facility] on 03/10/2024.   Appointment:   [] Confirmed  [x] Left mess   [] No answer/No voice mail  [] VM Full/unable to leave message  [] Phone not in service  Patient reminded to bring all medications and/or complete list.  Confirmed patient has transportation. Gave directions, instructed to utilize valet parking.

## 2024-03-13 ENCOUNTER — Ambulatory Visit (HOSPITAL_COMMUNITY)
Admission: RE | Admit: 2024-03-13 | Discharge: 2024-03-13 | Disposition: A | Source: Ambulatory Visit | Attending: Cardiology | Admitting: Cardiology

## 2024-03-13 ENCOUNTER — Ambulatory Visit (HOSPITAL_COMMUNITY): Payer: Self-pay | Admitting: Cardiology

## 2024-03-13 VITALS — BP 128/80 | HR 72 | Wt 240.6 lb

## 2024-03-13 DIAGNOSIS — I82622 Acute embolism and thrombosis of deep veins of left upper extremity: Secondary | ICD-10-CM | POA: Diagnosis not present

## 2024-03-13 DIAGNOSIS — I514 Myocarditis, unspecified: Secondary | ICD-10-CM | POA: Diagnosis not present

## 2024-03-13 DIAGNOSIS — I428 Other cardiomyopathies: Secondary | ICD-10-CM | POA: Insufficient documentation

## 2024-03-13 DIAGNOSIS — G4733 Obstructive sleep apnea (adult) (pediatric): Secondary | ICD-10-CM | POA: Diagnosis not present

## 2024-03-13 DIAGNOSIS — I5021 Acute systolic (congestive) heart failure: Secondary | ICD-10-CM | POA: Insufficient documentation

## 2024-03-13 DIAGNOSIS — Z7982 Long term (current) use of aspirin: Secondary | ICD-10-CM | POA: Insufficient documentation

## 2024-03-13 DIAGNOSIS — Z7984 Long term (current) use of oral hypoglycemic drugs: Secondary | ICD-10-CM | POA: Insufficient documentation

## 2024-03-13 DIAGNOSIS — Z95810 Presence of automatic (implantable) cardiac defibrillator: Secondary | ICD-10-CM | POA: Insufficient documentation

## 2024-03-13 DIAGNOSIS — I447 Left bundle-branch block, unspecified: Secondary | ICD-10-CM | POA: Diagnosis not present

## 2024-03-13 DIAGNOSIS — E785 Hyperlipidemia, unspecified: Secondary | ICD-10-CM | POA: Diagnosis not present

## 2024-03-13 DIAGNOSIS — I493 Ventricular premature depolarization: Secondary | ICD-10-CM | POA: Insufficient documentation

## 2024-03-13 DIAGNOSIS — Z7901 Long term (current) use of anticoagulants: Secondary | ICD-10-CM | POA: Insufficient documentation

## 2024-03-13 DIAGNOSIS — Z87891 Personal history of nicotine dependence: Secondary | ICD-10-CM | POA: Insufficient documentation

## 2024-03-13 DIAGNOSIS — I82409 Acute embolism and thrombosis of unspecified deep veins of unspecified lower extremity: Secondary | ICD-10-CM | POA: Insufficient documentation

## 2024-03-13 DIAGNOSIS — F40240 Claustrophobia: Secondary | ICD-10-CM | POA: Diagnosis not present

## 2024-03-13 DIAGNOSIS — Z79899 Other long term (current) drug therapy: Secondary | ICD-10-CM | POA: Diagnosis not present

## 2024-03-13 DIAGNOSIS — K219 Gastro-esophageal reflux disease without esophagitis: Secondary | ICD-10-CM | POA: Insufficient documentation

## 2024-03-13 DIAGNOSIS — M7989 Other specified soft tissue disorders: Secondary | ICD-10-CM | POA: Diagnosis not present

## 2024-03-13 DIAGNOSIS — J189 Pneumonia, unspecified organism: Secondary | ICD-10-CM | POA: Diagnosis not present

## 2024-03-13 DIAGNOSIS — I5022 Chronic systolic (congestive) heart failure: Secondary | ICD-10-CM | POA: Diagnosis not present

## 2024-03-13 DIAGNOSIS — I110 Hypertensive heart disease with heart failure: Secondary | ICD-10-CM | POA: Insufficient documentation

## 2024-03-13 DIAGNOSIS — I878 Other specified disorders of veins: Secondary | ICD-10-CM | POA: Insufficient documentation

## 2024-03-13 LAB — BASIC METABOLIC PANEL WITH GFR
Anion gap: 12 (ref 5–15)
BUN: 18 mg/dL (ref 8–23)
CO2: 23 mmol/L (ref 22–32)
Calcium: 9.9 mg/dL (ref 8.9–10.3)
Chloride: 102 mmol/L (ref 98–111)
Creatinine, Ser: 1.12 mg/dL (ref 0.61–1.24)
GFR, Estimated: >60 mL/min
Glucose, Bld: 139 mg/dL — ABNORMAL HIGH (ref 70–99)
Potassium: 4.4 mmol/L (ref 3.5–5.1)
Sodium: 136 mmol/L (ref 135–145)

## 2024-03-13 LAB — IRON AND TIBC
Iron: 74 ug/dL (ref 45–182)
Saturation Ratios: 16 % — ABNORMAL LOW (ref 17.9–39.5)
TIBC: 468 ug/dL — ABNORMAL HIGH (ref 250–450)
UIBC: 394 ug/dL

## 2024-03-13 LAB — CBC
HCT: 38.5 % — ABNORMAL LOW (ref 39.0–52.0)
Hemoglobin: 13.2 g/dL (ref 13.0–17.0)
MCH: 28 pg (ref 26.0–34.0)
MCHC: 34.3 g/dL (ref 30.0–36.0)
MCV: 81.7 fL (ref 80.0–100.0)
Platelets: 223 K/uL (ref 150–400)
RBC: 4.71 MIL/uL (ref 4.22–5.81)
RDW: 17.8 % — ABNORMAL HIGH (ref 11.5–15.5)
WBC: 5.3 K/uL (ref 4.0–10.5)
nRBC: 0 % (ref 0.0–0.2)

## 2024-03-13 LAB — FERRITIN: Ferritin: 108 ng/mL (ref 24–336)

## 2024-03-13 LAB — PRO BRAIN NATRIURETIC PEPTIDE: Pro Brain Natriuretic Peptide: 72.6 pg/mL

## 2024-03-13 MED ORDER — LOSARTAN POTASSIUM 50 MG PO TABS: 50.0000 mg | ORAL_TABLET | Freq: Every day | ORAL | 3 refills | Status: AC

## 2024-03-13 MED ORDER — ASPIRIN 81 MG PO TBEC: 81.0000 mg | DELAYED_RELEASE_TABLET | Freq: Every day | ORAL | Status: AC

## 2024-03-13 NOTE — Progress Notes (Signed)
 "  ADVANCED HF CLINIC NOTE  Primary Care: Delana Corean GRADE, MD at the [facility] HF Cardiologist: Dr. Rolan EP: Dr Inocencio  Chief Complaint : f/u for chronic systolic heart failure   HPI: 69 y.o. male with history of LBBB (noted on ECGs in 2018), HTN, OSA on CPAP, iron deficiency anemia, GERD, alcohol, THC and cocaine use. [place] most of his care through the [place].   Admitted 8/24 with acute systolic heart failure and PNA. Started on abx. Echo showed EF <20%, RV ok, IVC dilated and estimated RAP 15.  UDS + cocaine and THC. Diuresed with IV lasix . R/LHC showed no significant coronary disease. RHC with near normal filling pressures, and low CI at 2. Unable to complete cMRI with claustrophobia. GDMT titrated.  Echo 12/24 EF <20%, RV nl, trivial MR   CRT-D implanted 04/09/23. Followed by Dr. Inocencio.  ER visit 04/24/23, w/LUE swelling post implant >> DVT, started on Eliquis    Today, he returns for follow up of CHF.  He denies exertional dyspnea or chest pain.  No orthopnea/PND.  Main complaint is orthopedic, knee and hip pain, uses cane.  Mild lightheadedness if he stands too fast.  No palpitations. Unable to tolerate CPAP. Weight is down 7 lbs.   Device interrogation shows stable thoracic impedence. No VT. No AT/AF. 97% BiV pacing.   ECG (Personally reviewed): NSR, BiV pacing  Labs (9/25): LDL 21, K 4.2, creatinine 1.3, LFTs normal Labs (10/25): hgb 11.8  PMH: 1. Cocaine abuse 2. ETOH abuse 3. HTN 4. GERD 5. Chronic systolic CHF: Nonischemic cardiomyopathy. Medtronic CRT-D device.  - R/LHC (8/24): normal coronaries; RA 6, PA 34/17(25), PCWP 15, CO/Ci (Fick) 4.52/2, PVR 2.3 WU.  - Echo (8/24): EF < 20%, RV normal, dilated IVC, estimated RAP 15 - Echo (12/24): EF 20-25% with septal-lateral dyssynchrony, normal RV, IVC normal.  6. OSA: CPAP 7. Fe deficiency anemia.  8. Osteoarthritis 9. 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  10. Angioedema with ACEI  Current Outpatient Medications   Medication Sig Dispense Refill   acetaminophen  (TYLENOL ) 500 MG tablet Take 500-1,000 mg by mouth every 6 (six) hours as needed for moderate pain (pain score 4-6). joint     aspirin  EC 81 MG tablet Take 1 tablet (81 mg total) by mouth daily. Swallow whole.     carvedilol  (COREG ) 6.25 MG tablet Take 1 tablet (6.25 mg total) by mouth 2 (two) times daily with a meal. 180 tablet 3   Cholecalciferol (VITAMIN D) 50 MCG (2000 UT) tablet Take 2,000 Units by mouth daily.     cycloSPORINE (RESTASIS) 0.05 % ophthalmic emulsion Place 1 drop into both eyes daily.     diclofenac Sodium (VOLTAREN) 1 % GEL Apply 2 g topically 4 (four) times daily as needed (pain).     DULoxetine  (CYMBALTA ) 20 MG capsule Take 40 mg by mouth daily.     empagliflozin  (JARDIANCE ) 25 MG TABS tablet Take 12.5 mg by mouth daily.     gabapentin  (NEURONTIN ) 300 MG capsule Take 300 mg by mouth 2 (two) times daily.     lidocaine  (LIDODERM ) 5 % Place 1 patch onto the skin daily as needed (pain).     omeprazole (PRILOSEC) 20 MG capsule Take 20 mg by mouth daily.     potassium chloride  SA (KLOR-CON  M) 20 MEQ tablet TAKE 1 TABLET BY MOUTH EVERY DAY 90 tablet 3   rosuvastatin  (CRESTOR ) 10 MG tablet TAKE 1 TABLET BY MOUTH EVERY DAY 90 tablet 3   sildenafil (VIAGRA)  100 MG tablet Take 100 mg by mouth as needed for erectile dysfunction.     spironolactone  (ALDACTONE ) 25 MG tablet TAKE 1 TABLET (25 MG TOTAL) BY MOUTH DAILY. 90 tablet 3   torsemide  (DEMADEX ) 20 MG tablet Take 1 tablet (20 mg total) by mouth as needed.     apixaban  (ELIQUIS ) 5 MG TABS tablet Take 1 tablet (5 mg total) by mouth 2 (two) times daily. Start taking after completion of starter pack. (Patient not taking: Reported on 03/13/2024) 60 tablet 1   losartan  (COZAAR ) 50 MG tablet Take 1 tablet (50 mg total) by mouth daily. 90 tablet 3   No current facility-administered medications for this encounter.   Allergies  Allergen Reactions   Lisinopril Swelling   Shellfish Allergy  Hives   Shellfish Protein-Containing Drug Products Other (See Comments)   Social History   Socioeconomic History   Marital status: Single    Spouse name: Not on file   Number of children: 1   Years of education: Not on file   Highest education level: Not on file  Occupational History   Occupation: retired  Tobacco Use   Smoking status: Former    Types: Cigarettes   Smokeless tobacco: Never   Tobacco comments:    Not able to remember when he quit  Vaping Use   Vaping status: Never Used  Substance and Sexual Activity   Alcohol use: Yes    Alcohol/week: 1.0 standard drink of alcohol    Types: 1 Cans of beer per week    Comment: social   Drug use: Not Currently    Frequency: 1.0 times per week    Types: Marijuana   Sexual activity: Not Currently  Other Topics Concern   Not on file  Social History Narrative   Not on file   Social Drivers of Health   Tobacco Use: Medium Risk (01/03/2024)   Patient History    Smoking Tobacco Use: Former    Smokeless Tobacco Use: Never    Passive Exposure: Not on Actuary Strain: Not on file  Food Insecurity: No Food Insecurity (10/20/2022)   Hunger Vital Sign    Worried About Running Out of Food in the Last Year: Never true    Ran Out of Food in the Last Year: Never true  Transportation Needs: No Transportation Needs (10/20/2022)   PRAPARE - Administrator, Civil Service (Medical): No    Lack of Transportation (Non-Medical): No  Physical Activity: Not on file  Stress: Not on file  Social Connections: Unknown (07/03/2021)   Received from [facility]   Social Network    Social Network: Not on file  Intimate Partner Violence: Not At Risk (10/20/2022)   Humiliation, Afraid, Rape, and Kick questionnaire    Fear of Current or Ex-Partner: No    Emotionally Abused: No    Physically Abused: No    Sexually Abused: No  Depression (PHQ2-9): Not on file  Alcohol Screen: Not on file  Housing: Low Risk (10/20/2022)    Housing    Last Housing Risk Score: 0  Utilities: Not At Risk (10/20/2022)   AHC Utilities    Threatened with loss of utilities: No  Health Literacy: Not on file   Family History  Problem Relation Age of Onset   Stroke Mother    Other Father        patient was a kid when his Dad died, not sure of the cause   Colon cancer Neg Hx  Esophageal cancer Neg Hx    BP 128/80   Pulse 72   Wt 109.1 kg (240 lb 9.6 oz)   SpO2 95%   BMI 31.74 kg/m   Wt Readings from Last 3 Encounters:  03/13/24 109.1 kg (240 lb 9.6 oz)  01/03/24 109.3 kg (241 lb)  11/24/23 111.6 kg (246 lb)   Physical Exam  General: NAD Neck: No JVD, no thyromegaly or thyroid nodule.  Lungs: Clear to auscultation bilaterally with normal respiratory effort. CV: Nondisplaced PMI.  Heart regular S1/S2, no S3/S4, no murmur.  No peripheral edema.  No carotid bruit.  Normal pedal pulses.  Abdomen: Soft, nontender, no hepatosplenomegaly, no distention.  Skin: Intact without lesions or rashes.  Neurologic: Alert and oriented x 3.  Psych: Normal affect. Extremities: No clubbing or cyanosis.  HEENT: Normal.   ASSESSMENT & PLAN: 1. Chronic systolic CHF: Nonischemic cardiomyopathy.  Medtronic CRT-D device.  Echo (8/24) showed EF < 20%, moderate LV dilation, RV normal, IVC dilated.  Possible etiologies include substance abuse (cocaine/ETOH), long-standing HTN, ? myocarditis. Also had wide LBBB. No strong family history of CHF.  Coronary angiography showed no significant coronary disease; RHC with RA 6, PCWP 15, CI 2. Echo 12/24 EF 20-25% range with septal-lateral dyssynchrony, normal RV size and systolic function, IVC normal. NYHA class II, not volume overloaded by exam or Optivol.  Limited mainly by orthopedic issues.  - Continue Torsemide  20 mg PRN (has not needed).  - Continue Coreg  6.25 mg bid  - Increase losartan  to 50 mg daily (no ACEI or ARNI with angioedema on ACEi). BMET/BNP today, BMET in 10 days.  - Continue  spironolactone  25 mg daily  - Continue Jardiance  10 mg daily  - Unable to complete cardiac MRI due to claustrophobia. - I will arrange for repeat echo.  2. HTN: BP controlled.  3. OSA: He has CPAP but doesn't use it. Cannot tolerate facemask   4. ETOH abuse: Drinks 1 glass of wine a day.  5. H/O Cocaine abuse: denies recent use  6. Hyperlipidemia: Good lipids in 9/25.  7. H/o LUE DVT: diagnosed 3/25 after CRT-D implant, completed Eliquis  x 3 months.  He feels like his left arm still swells at time.  - Repeat LUE ultrasound to rule out DVT.  - ASA 81 mg daily.  8. Anemia: Check Fe studies.    Followup in 3 months with APP.   I spent 32 minutes reviewing records, interviewing/examining patient, and managing orders.   Ezra Shuck  03/13/24  "

## 2024-03-13 NOTE — Patient Instructions (Signed)
 CHANGE Losartan  to 50 mg daily.  START Asprin 81 mg daily.  Labs done today, your results will be available in MyChart, we will contact you for abnormal readings.  Your physician has requested that you have an echocardiogram. Echocardiography is a painless test that uses sound waves to create images of your heart. It provides your doctor with information about the size and shape of your heart and how well your hearts chambers and valves are working. This procedure takes approximately one hour. There are no restrictions for this procedure. Please do NOT wear cologne, perfume, aftershave, or lotions (deodorant is allowed). Please arrive 15 minutes prior to your appointment time.  Please note: We ask at that you not bring children with you during ultrasound (echo/ vascular) testing. Due to room size and safety concerns, children are not allowed in the ultrasound rooms during exams. Our front office staff cannot provide observation of children in our lobby area while testing is being conducted. An adult accompanying a patient to their appointment will only be allowed in the ultrasound room at the discretion of the ultrasound technician under special circumstances. We apologize for any inconvenience.  Your provider has ordered an ultra sound of your left arm. You will be called to have this test arranged.  Your physician recommends that you schedule a follow-up appointment in: 3 months.  If you have any questions or concerns before your next appointment please send us  a message through [place] or call our office at [phone].    TO LEAVE A MESSAGE FOR THE NURSE SELECT OPTION 2, PLEASE LEAVE A MESSAGE INCLUDING: YOUR NAME DATE OF BIRTH CALL BACK NUMBER REASON FOR CALL**this is important as we prioritize the call backs  YOU WILL RECEIVE A CALL BACK THE SAME DAY AS LONG AS YOU CALL BEFORE 4:00 PM  At the [facility], you and your health needs are our priority. As part of our  continuing mission to provide you with exceptional heart care, we have created designated Provider Care Teams. These Care Teams include your primary Cardiologist (physician) and Advanced Practice Providers (APPs- Physician Assistants and Nurse Practitioners) who all work together to provide you with the care you need, when you need it.   You may see any of the following providers on your designated Care Team at your next follow up: Dr Toribio Fuel Dr Ezra Shuck Dr. Morene Brownie Greig Mosses, NP Caffie Shed, [place] [facility] [place], [place] Beckey Coe, NP Jordan Lee, NP Ellouise Class, NP Tinnie Redman, PharmD Jaun Bash, PharmD   Please be sure to bring in all your medications bottles to every appointment.    Thank you for choosing [facility]

## 2024-03-16 ENCOUNTER — Ambulatory Visit (HOSPITAL_COMMUNITY)
Admission: RE | Admit: 2024-03-16 | Discharge: 2024-03-16 | Disposition: A | Discharge status: Home / Self Care | Source: Ambulatory Visit | Attending: Cardiology | Admitting: Cardiology

## 2024-03-16 DIAGNOSIS — I82622 Acute embolism and thrombosis of deep veins of left upper extremity: Secondary | ICD-10-CM | POA: Insufficient documentation

## 2024-03-31 ENCOUNTER — Ambulatory Visit (HOSPITAL_COMMUNITY): Admission: RE | Admit: 2024-03-31 | Source: Ambulatory Visit

## 2024-03-31 ENCOUNTER — Ambulatory Visit (HOSPITAL_COMMUNITY)

## 2024-03-31 DIAGNOSIS — I5022 Chronic systolic (congestive) heart failure: Secondary | ICD-10-CM

## 2024-03-31 LAB — ECHOCARDIOGRAM COMPLETE
Area-P 1/2: 3.42 cm2
Calc EF: 29.3 %
S' Lateral: 4.85 cm
Single Plane A2C EF: 33 %
Single Plane A4C EF: 29.3 %

## 2024-03-31 NOTE — Progress Notes (Signed)
" °  Echocardiogram 2D Echocardiogram has been performed.  Koleen KANDICE Popper, RDCS 03/31/2024, 11:31 AM "

## 2024-05-26 ENCOUNTER — Ambulatory Visit

## 2024-06-12 ENCOUNTER — Ambulatory Visit (HOSPITAL_COMMUNITY)

## 2024-08-25 ENCOUNTER — Ambulatory Visit
